# Patient Record
Sex: Female | Born: 1985 | Race: White | Hispanic: No | Marital: Married | Smoking: Former smoker
Health system: Southern US, Community
[De-identification: ages and names within clinical notes are randomized; demographics above are authoritative.]

## PROBLEM LIST (undated history)

## (undated) DIAGNOSIS — F329 Major depressive disorder, single episode, unspecified: Secondary | ICD-10-CM

## (undated) DIAGNOSIS — F32A Depression, unspecified: Secondary | ICD-10-CM

## (undated) DIAGNOSIS — R519 Headache, unspecified: Secondary | ICD-10-CM

## (undated) DIAGNOSIS — Z9889 Other specified postprocedural states: Secondary | ICD-10-CM

## (undated) DIAGNOSIS — I639 Cerebral infarction, unspecified: Secondary | ICD-10-CM

## (undated) DIAGNOSIS — R112 Nausea with vomiting, unspecified: Secondary | ICD-10-CM

## (undated) DIAGNOSIS — I058 Other rheumatic mitral valve diseases: Secondary | ICD-10-CM

## (undated) DIAGNOSIS — Q513 Bicornate uterus: Secondary | ICD-10-CM

## (undated) DIAGNOSIS — O3400 Maternal care for unspecified congenital malformation of uterus, unspecified trimester: Secondary | ICD-10-CM

## (undated) DIAGNOSIS — F419 Anxiety disorder, unspecified: Secondary | ICD-10-CM

## (undated) DIAGNOSIS — D649 Anemia, unspecified: Secondary | ICD-10-CM

## (undated) DIAGNOSIS — M359 Systemic involvement of connective tissue, unspecified: Secondary | ICD-10-CM

## (undated) DIAGNOSIS — I059 Rheumatic mitral valve disease, unspecified: Secondary | ICD-10-CM

## (undated) DIAGNOSIS — D6851 Activated protein C resistance: Secondary | ICD-10-CM

## (undated) HISTORY — PX: OTHER SURGICAL HISTORY: SHX169

## (undated) HISTORY — DX: Rheumatic mitral valve disease, unspecified: I05.9

## (undated) HISTORY — DX: Other rheumatic mitral valve diseases: I05.8

## (undated) HISTORY — DX: Activated protein C resistance: D68.51

---
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
## 2006-09-12 ENCOUNTER — Emergency Department: Payer: Self-pay | Admitting: Emergency Medicine

## 2008-06-16 ENCOUNTER — Emergency Department (HOSPITAL_COMMUNITY): Admission: EM | Admit: 2008-06-16 | Discharge: 2008-06-16 | Payer: Self-pay | Admitting: Emergency Medicine

## 2011-05-02 ENCOUNTER — Emergency Department: Payer: Self-pay | Admitting: Emergency Medicine

## 2011-05-13 NOTE — Op Note (Signed)
NAME:  Cristina [name], Cristina [name]                       ACCOUNT NO.:  [id number]   MEDICAL RECORD NO.:  [id number]                   PATIENT TYPE:  AMB   LOCATION:  DSC                                  FACILITY:  [facility]   PHYSICIAN:  Kathy Breach, M.D.                   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   OPERATION PERFORMED:  Bipedicle fascia graft tympanoplasty with an autograft  incus fasciculoplasty, right ear.   SURGEON:  Kathy Breach, M.D.   ANESTHESIA:  General orotracheal anesthesia.   DESCRIPTION OF PROCEDURE:  With the patient under general orotracheal  anesthesia, the right ear was prepped and draped in sterile fashion.  The  ear was inspected.  The patient had a dry large perforation approximately  80% of the tympanic membrane. The long process of the malleus was retracted  in a foreshortened position.  Around the margin of the posterior-superior  aspect, the atrophic tympanic membrane could be seen adherent onto the  stapes capitulum with apparent loss of the lenticular process of the incus.  Margins of perforation were completely stripped after having infiltrated the  canal skin with 1% Xylocaine 1:100,000 epinephrine as well as skin just  above the hairline posterior superior to the auricle for fascia graft  harvesting. Curved incision posterior superior to the auricle was made above  the hairline through skin and subcutaneous tissues down to the temporalis  fascia.  A large temporalis fascia graft was harvested and pressed between  tongue blades.  The donor site was closed with interrupted 4-0 chromic  catgut sutures interrupted and a  running 4-0 chromic subcuticular skin  closure.  Attention was then turned to the ear.  Tympanotomy flap was cut  from 6 o'clock inferior and 12 o'clock superiorly.  Just a remaining thin  section of canal skin was elevated and middle ear space entered.  The  remaining tympanic membrane retracted adherent on the capitulum posterior  superiorly was removed as it did not elevate with the flap.  All tympanic  membrane was cleaned in the area.  There was a fibrous adhesion from the  stapes capitulum to the eroded incus long process with complete ossicular  discontinuity.  The chorda tympani nerve was sacrificed to spread out over  the area as the incus was delivered for incus rotation. The mucosa was  cleaned off the stapes capitulum head.  There was 3 mm from the annulus to  the stapes capitular head but the norma position of the tympanic membrane  was somewhat retracted preoperatively.  Tensor tympani was severed to  lateralize the foreshortened and medialized  position of the long process of  the malleus.  An incus was then fashioned to do an incus rotation onlay with  the stapes capitulum placing the 1 mm pothole on the concave surface of the  body of the incus and allowing the convex surface to come towards the  tympanic membrane.  Gelfoam pledgets were used to pack the anterior two  thirds of the middle ear space.  The fascia graft was trimmed as smooth  edges and inserted, positioned medial to the long process of the malleus and  underlying the margins of the anterior and inferior aspects of the  perforation.  Graft was turned forward exposing the posterior superior  quadrant.  The incus autograft was placed on the head of the staples and  further packing the middle ear space to position the fascia graft to the  undersurface of the remnant, the tympanic membrane and stabilize the  position of the rotation of the incus.  The lateral surface of the rotated  incus was left exposed to the  fascia graft which was then turned back in  position spanning the entire position and elevated up the posterior canal  wall.  Tympanotomy flap was turned back in position overlying the posterior  aspect of the fascia  graft. This was secured in position packing the external canal with Gelfoam  soaked with Ciprodex suspension.  Sterile cotton was placed in the external  meatus.  Cortisporin ointment was applied to the donor site incision line.  The patient tolerated the procedure well and was taken to the recovery room  in stable general condition.                                               Kathy Breach, M.D.    Venia Minks  D:  03/12/2004  T:  03/15/2004  Job:  [id number]

## 2011-09-22 LAB — GC/CHLAMYDIA PROBE AMP, GENITAL
Chlamydia, DNA Probe: NEGATIVE
GC Probe Amp, Genital: NEGATIVE

## 2011-09-22 LAB — CBC
Hemoglobin: 12.4
MCHC: 34
RBC: 3.7 — ABNORMAL LOW
WBC: 9.3

## 2011-09-22 LAB — POCT PREGNANCY, URINE: Operator id: 294501

## 2011-09-22 LAB — DIFFERENTIAL
Lymphocytes Relative: 14
Lymphs Abs: 1.3
Monocytes Absolute: 0.4
Monocytes Relative: 5
Neutro Abs: 7.4

## 2011-09-22 LAB — WET PREP, GENITAL
Trich, Wet Prep: NONE SEEN
Yeast Wet Prep HPF POC: NONE SEEN

## 2011-09-22 LAB — OCCULT BLOOD X 1 CARD TO LAB, STOOL: Fecal Occult Bld: POSITIVE

## 2011-09-22 LAB — HCG, QUANTITATIVE, PREGNANCY: hCG, Beta Chain, Quant, S: 9374 — ABNORMAL HIGH

## 2011-11-28 ENCOUNTER — Emergency Department: Payer: Self-pay | Admitting: Internal Medicine

## 2012-08-01 ENCOUNTER — Inpatient Hospital Stay: Payer: Self-pay | Admitting: Obstetrics and Gynecology

## 2012-08-01 DIAGNOSIS — O365990 Maternal care for other known or suspected poor fetal growth, unspecified trimester, not applicable or unspecified: Secondary | ICD-10-CM

## 2012-08-01 LAB — PROTEIN / CREATININE RATIO, URINE
Protein, Random Urine: 8 mg/dL (ref 0–12)
Protein/Creat. Ratio: 258 mg/gCREAT — ABNORMAL HIGH (ref 0–200)

## 2012-08-01 LAB — CBC WITH DIFFERENTIAL/PLATELET
Eosinophil %: 0.5 %
Lymphocyte #: 2.5 10*3/uL (ref 1.0–3.6)
Lymphocyte %: 22.9 %
MCHC: 35.8 g/dL (ref 32.0–36.0)
MCV: 98 fL (ref 80–100)
Monocyte %: 4.6 %
Platelet: 166 10*3/uL (ref 150–440)

## 2012-08-01 LAB — BASIC METABOLIC PANEL
Anion Gap: 9 (ref 7–16)
BUN: 7 mg/dL (ref 7–18)
Chloride: 104 mmol/L (ref 98–107)
Co2: 22 mmol/L (ref 21–32)
Creatinine: 0.65 mg/dL (ref 0.60–1.30)
EGFR (Non-African Amer.): >60
Glucose: 79 mg/dL (ref 65–99)
Osmolality: 267 (ref 275–301)
Sodium: 135 mmol/L — ABNORMAL LOW (ref 136–145)

## 2012-08-01 LAB — URIC ACID: Uric Acid: 4 mg/dL (ref 2.6–6.0)

## 2012-08-02 LAB — HEMATOCRIT: HCT: 30.3 % — ABNORMAL LOW (ref 35.0–47.0)

## 2012-08-03 LAB — PATHOLOGY REPORT

## 2014-06-25 DIAGNOSIS — I639 Cerebral infarction, unspecified: Secondary | ICD-10-CM

## 2014-06-25 HISTORY — DX: Cerebral infarction, unspecified: I63.9

## 2014-07-16 ENCOUNTER — Encounter (HOSPITAL_COMMUNITY): Payer: Self-pay | Admitting: Radiology

## 2014-07-16 ENCOUNTER — Inpatient Hospital Stay (HOSPITAL_COMMUNITY)
Admission: EM | Admit: 2014-07-16 | Discharge: 2014-07-19 | DRG: 065 | Disposition: A | Discharge status: Home / Self Care | Payer: Medicaid Other | Attending: Internal Medicine | Admitting: Internal Medicine

## 2014-07-16 ENCOUNTER — Emergency Department (HOSPITAL_COMMUNITY): Payer: Medicaid Other

## 2014-07-16 DIAGNOSIS — I058 Other rheumatic mitral valve diseases: Secondary | ICD-10-CM

## 2014-07-16 DIAGNOSIS — D649 Anemia, unspecified: Secondary | ICD-10-CM

## 2014-07-16 DIAGNOSIS — D6489 Other specified anemias: Secondary | ICD-10-CM | POA: Diagnosis present

## 2014-07-16 DIAGNOSIS — M3211 Endocarditis in systemic lupus erythematosus: Secondary | ICD-10-CM | POA: Insufficient documentation

## 2014-07-16 DIAGNOSIS — F17200 Nicotine dependence, unspecified, uncomplicated: Secondary | ICD-10-CM | POA: Diagnosis present

## 2014-07-16 DIAGNOSIS — G8190 Hemiplegia, unspecified affecting unspecified side: Secondary | ICD-10-CM | POA: Diagnosis present

## 2014-07-16 DIAGNOSIS — E876 Hypokalemia: Secondary | ICD-10-CM | POA: Diagnosis present

## 2014-07-16 DIAGNOSIS — R29810 Facial weakness: Secondary | ICD-10-CM | POA: Diagnosis present

## 2014-07-16 DIAGNOSIS — Z823 Family history of stroke: Secondary | ICD-10-CM | POA: Diagnosis not present

## 2014-07-16 DIAGNOSIS — N19 Unspecified kidney failure: Secondary | ICD-10-CM | POA: Diagnosis present

## 2014-07-16 DIAGNOSIS — F411 Generalized anxiety disorder: Secondary | ICD-10-CM | POA: Diagnosis present

## 2014-07-16 DIAGNOSIS — Z79899 Other long term (current) drug therapy: Secondary | ICD-10-CM

## 2014-07-16 DIAGNOSIS — I6340 Cerebral infarction due to embolism of unspecified cerebral artery: Principal | ICD-10-CM | POA: Diagnosis present

## 2014-07-16 DIAGNOSIS — R29898 Other symptoms and signs involving the musculoskeletal system: Secondary | ICD-10-CM | POA: Diagnosis not present

## 2014-07-16 DIAGNOSIS — I059 Rheumatic mitral valve disease, unspecified: Secondary | ICD-10-CM

## 2014-07-16 DIAGNOSIS — I639 Cerebral infarction, unspecified: Secondary | ICD-10-CM | POA: Diagnosis present

## 2014-07-16 DIAGNOSIS — I498 Other specified cardiac arrhythmias: Secondary | ICD-10-CM | POA: Diagnosis present

## 2014-07-16 DIAGNOSIS — R471 Dysarthria and anarthria: Secondary | ICD-10-CM | POA: Diagnosis present

## 2014-07-16 DIAGNOSIS — G43909 Migraine, unspecified, not intractable, without status migrainosus: Secondary | ICD-10-CM | POA: Diagnosis present

## 2014-07-16 DIAGNOSIS — I6359 Cerebral infarction due to unspecified occlusion or stenosis of other cerebral artery: Secondary | ICD-10-CM

## 2014-07-16 DIAGNOSIS — I63411 Cerebral infarction due to embolism of right middle cerebral artery: Secondary | ICD-10-CM

## 2014-07-16 HISTORY — DX: Cerebral infarction, unspecified: I63.9

## 2014-07-16 LAB — URINALYSIS, ROUTINE W REFLEX MICROSCOPIC
Bilirubin Urine: NEGATIVE
Glucose, UA: NEGATIVE mg/dL
Hgb urine dipstick: NEGATIVE
Ketones, ur: NEGATIVE mg/dL
LEUKOCYTES UA: NEGATIVE
Nitrite: NEGATIVE
PROTEIN: NEGATIVE mg/dL
Specific Gravity, Urine: 1.008 (ref 1.005–1.030)
UROBILINOGEN UA: 0.2 mg/dL (ref 0.0–1.0)
pH: 6 (ref 5.0–8.0)

## 2014-07-16 LAB — COMPREHENSIVE METABOLIC PANEL
ALBUMIN: 3.8 g/dL (ref 3.5–5.2)
ALK PHOS: 46 U/L (ref 39–117)
ALT: 11 U/L (ref 0–35)
AST: 20 U/L (ref 0–37)
Anion gap: 15 (ref 5–15)
BILIRUBIN TOTAL: 0.5 mg/dL (ref 0.3–1.2)
BUN: 9 mg/dL (ref 6–23)
CO2: 22 mEq/L (ref 19–32)
Calcium: 9.2 mg/dL (ref 8.4–10.5)
Chloride: 101 mEq/L (ref 96–112)
Creatinine, Ser: 1.13 mg/dL — ABNORMAL HIGH (ref 0.50–1.10)
GFR calc Af Amer: 76 mL/min — ABNORMAL LOW (ref >90)
GFR calc non Af Amer: 66 mL/min — ABNORMAL LOW (ref >90)
Glucose, Bld: 145 mg/dL — ABNORMAL HIGH (ref 70–99)
POTASSIUM: 3.3 meq/L — AB (ref 3.7–5.3)
Sodium: 138 mEq/L (ref 137–147)
Total Protein: 7.7 g/dL (ref 6.0–8.3)

## 2014-07-16 LAB — I-STAT CHEM 8, ED
BUN: 7 mg/dL (ref 6–23)
Calcium, Ion: 1.14 mmol/L (ref 1.12–1.23)
Chloride: 102 mEq/L (ref 96–112)
Creatinine, Ser: 1.3 mg/dL — ABNORMAL HIGH (ref 0.50–1.10)
GLUCOSE: 147 mg/dL — AB (ref 70–99)
HEMATOCRIT: 40 % (ref 36.0–46.0)
HEMOGLOBIN: 13.6 g/dL (ref 12.0–15.0)
POTASSIUM: 3 meq/L — AB (ref 3.7–5.3)
SODIUM: 138 meq/L (ref 137–147)
TCO2: 21 mmol/L (ref 0–100)

## 2014-07-16 LAB — CBC WITH DIFFERENTIAL/PLATELET
BASOS ABS: 0 10*3/uL (ref 0.0–0.1)
BASOS PCT: 0 % (ref 0–1)
EOS PCT: 2 % (ref 0–5)
Eosinophils Absolute: 0.1 10*3/uL (ref 0.0–0.7)
HCT: 37.3 % (ref 36.0–46.0)
Hemoglobin: 13 g/dL (ref 12.0–15.0)
LYMPHS PCT: 50 % — AB (ref 12–46)
Lymphs Abs: 3.3 10*3/uL (ref 0.7–4.0)
MCH: 32.6 pg (ref 26.0–34.0)
MCHC: 34.9 g/dL (ref 30.0–36.0)
MCV: 93.5 fL (ref 78.0–100.0)
Monocytes Absolute: 0.4 10*3/uL (ref 0.1–1.0)
Monocytes Relative: 7 % (ref 3–12)
NEUTROS ABS: 2.7 10*3/uL (ref 1.7–7.7)
Neutrophils Relative %: 41 % — ABNORMAL LOW (ref 43–77)
PLATELETS: 176 10*3/uL (ref 150–400)
RBC: 3.99 MIL/uL (ref 3.87–5.11)
RDW: 12.2 % (ref 11.5–15.5)
WBC: 6.6 10*3/uL (ref 4.0–10.5)

## 2014-07-16 LAB — RAPID URINE DRUG SCREEN, HOSP PERFORMED
AMPHETAMINES: NOT DETECTED
BARBITURATES: NOT DETECTED
Benzodiazepines: NOT DETECTED
Cocaine: NOT DETECTED
Opiates: NOT DETECTED
TETRAHYDROCANNABINOL: NOT DETECTED

## 2014-07-16 LAB — PROTIME-INR
INR: 0.94 (ref 0.00–1.49)
PROTHROMBIN TIME: 12.6 s (ref 11.6–15.2)

## 2014-07-16 LAB — APTT: aPTT: 30 seconds (ref 24–37)

## 2014-07-16 LAB — ETHANOL

## 2014-07-16 LAB — CK TOTAL AND CKMB (NOT AT ARMC)
CK TOTAL: 103 U/L (ref 7–177)
CK, MB: 1.4 ng/mL (ref 0.3–4.0)
Relative Index: 1.4 (ref 0.0–2.5)

## 2014-07-16 LAB — I-STAT TROPONIN, ED: TROPONIN I, POC: 0.02 ng/mL (ref 0.00–0.08)

## 2014-07-16 LAB — TROPONIN I

## 2014-07-16 LAB — CBG MONITORING, ED: Glucose-Capillary: 94 mg/dL (ref 70–99)

## 2014-07-16 LAB — POC URINE PREG, ED: Preg Test, Ur: NEGATIVE

## 2014-07-16 MED ORDER — SODIUM CHLORIDE 0.9 % IV BOLUS (SEPSIS): 1000.0000 mL | Freq: Once | INTRAVENOUS | Status: DC

## 2014-07-16 MED ORDER — ALTEPLASE (STROKE) FULL DOSE INFUSION
0.9000 mg/kg | Freq: Once | INTRAVENOUS | Status: DC
  Filled 2014-07-16: qty 45

## 2014-07-16 MED ORDER — SODIUM CHLORIDE 0.9 % IV BOLUS (SEPSIS)
500.0000 mL | Freq: Once | INTRAVENOUS | Status: AC
  Administered 2014-07-16: 500 mL via INTRAVENOUS

## 2014-07-16 MED ORDER — IOHEXOL 300 MG/ML  SOLN
50.0000 mL | Freq: Once | INTRAMUSCULAR | Status: AC | PRN
  Administered 2014-07-16: 50 mL via INTRAVENOUS

## 2014-07-16 MED ORDER — ACETAMINOPHEN 325 MG PO TABS
650.0000 mg | ORAL_TABLET | Freq: Once | ORAL | Status: AC
  Administered 2014-07-17: 650 mg via ORAL
  Filled 2014-07-16: qty 2

## 2014-07-16 MED ORDER — POTASSIUM CHLORIDE CRYS ER 20 MEQ PO TBCR: 40.0000 meq | EXTENDED_RELEASE_TABLET | Freq: Once | ORAL | Status: DC

## 2014-07-16 MED ORDER — ASPIRIN EC 325 MG PO TBEC
325.0000 mg | DELAYED_RELEASE_TABLET | Freq: Once | ORAL | Status: AC
  Administered 2014-07-17: 325 mg via ORAL
  Filled 2014-07-16: qty 1

## 2014-07-16 MED ORDER — POTASSIUM CHLORIDE 10 MEQ/100ML IV SOLN
10.0000 meq | Freq: Once | INTRAVENOUS | Status: AC
  Administered 2014-07-16: 10 meq via INTRAVENOUS
  Filled 2014-07-16: qty 100

## 2014-07-16 NOTE — ED Notes (Signed)
Patient states she has a headache at this time.

## 2014-07-16 NOTE — ED Notes (Signed)
To CT with RN.

## 2014-07-16 NOTE — Consult Note (Addendum)
Neurology Consultation Reason for Consult: L arm weakness Referring Physician: Rhunette Croft, A  CC:  L Arm weakness  History is obtained from:Patient  HPI: Cristina [name] is a 28 y.o. female wit a history of migraines and anxiety who presents with left arm weakness that started earlier to night. She states taht she noticed tha she tried to tell her son something and was unable to say it. Her friend then looked at her mouth and noticed it was drooping and insisted that she come to the ER. She had some improvement after arrival .  She denies IV drug use. She denies any drug use other than occasional marijuana. She does take birth control and smoke. She has a history of migraines and had one last night, but no headache currently. She states that it was her typical migraine. She states taht she does get headacehs several tiems per month, but that they are much better than when she was younger.   She had some shortness of breath en route, but no further symptoms at this time.   LKW: 7:40 pm tpa given?: no, mild improving symptoms.   ROS: A 14 point ROS was performed and is negative except as noted in the HPI.   PMH: Anxiety migraine  Family History: Grandfather - stroke  Social History: Tob: current smoker  Exam: Current vital signs: BP 128/81  Pulse 117  Resp 22  Ht 5\' 2"  (1.575 m)  Wt 49.896 kg (110 lb)  BMI 20.11 kg/m2  SpO2 100% Vital signs in last 24 hours: Pulse Rate:  [117] 117 (07/22 2048) Resp:  [22] 22 (07/22 2048) BP: (128)/(81) 128/81 mmHg (07/22 2048) SpO2:  [100 %] 100 % (07/22 2048) Weight:  [49.896 kg (110 lb)] 49.896 kg (110 lb) (07/22 2048)  General: in bed, NAD CV: tachycardic Mental Status: Patient is awake, alert, oriented to person, place, month, year, and situation. Immediate and remote memory are intact. Patient is able to give a clear and coherent history. No signs of aphasia or neglect Cranial Nerves: II: Visual Fields are full. Pupils are  equal, round, and reactive to light.  Discs are difficult to visualize. III,IV, VI: EOMI without ptosis or diploplia.  V: Facial sensation is symmetric to temperature VII: Facial movement is symmetric.  VIII: hearing is intact to voice X: Uvula elevates symmetrically XI: Shoulder shrug is symmetric. XII: tongue is midline without atrophy or fasciculations.  Motor: Tone is normal. Bulk is normal. 5/5 strength was present in right arm, leg, and left leg. Initially, she was barely able to lift her left arm against gravity, but subsequently had just a mild drift, though still with some difficulty with fine motor movements.  Sensory: Sensation is symmetric to light touch and pin in the arms and legs. Deep Tendon Reflexes: 2+ and symmetric in the biceps and patellae.  Plantars: Toes are downgoing bilaterally.  Cerebellar: FNF and HKS are intact on the right and left leg, impaired FNF in the left arm consistent with weakness.  Gait: Not assessed due to acute nature of evaluation and multiple medical monitors in ED setting.   I have reviewed labs in epic and the results pertinent to this consultation are: Cr 1.13   I have reviewed the images obtained: MRI brain - multifocal infarcts.   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 Her symptoms rapidly improved in the ER to an NIHSS of 1. Given the mild improving symptoms, on label tPA was not pursued.  I discussed the PRISMS trial with the patient, but after discussing with her family, she opted to not pursue this trial.   Clearly in a patient this young further workup is needed. I would begin with CAT head and neck and further MR imaging including MRV, MRI w/wo contrast. If no clear cause of the stroke is found, then hypercoagulabale and consideration of autoimmune workup may be needed.   Recommendations: 1. HgbA1c, fasting lipid panel 2. MRI  of the brain with and without contrast 3. Frequent neuro  checks 4. Echocardiogram 5. Carotid dopplers are not needed given CTA 6. Prophylactic therapy-Antiplatelet med: Aspirin - dose 325mg  PO or 300mg  PR 7. Risk factor modification 8. Telemetry monitoring 9. PT consult, OT consult, Speech consult  Ritta SlotMcNeill Nefertiti Mohamad, MD [facility] [phone]  If 7pm- 7am, please page neurology on call as listed in [facility].

## 2014-07-16 NOTE — ED Notes (Signed)
Pt. reports sudden onset left arm weakness onset 7:45 pm this evening , speech clear/ no facial asymmetry / alert and oriented. Code stroke initiated. Charge nurse/EDP notified.

## 2014-07-16 NOTE — ED Notes (Signed)
Patient into MRI at this time.

## 2014-07-17 ENCOUNTER — Encounter (HOSPITAL_COMMUNITY): Payer: Self-pay | Admitting: Internal Medicine

## 2014-07-17 ENCOUNTER — Inpatient Hospital Stay (HOSPITAL_COMMUNITY): Payer: Medicaid Other

## 2014-07-17 DIAGNOSIS — I6350 Cerebral infarction due to unspecified occlusion or stenosis of unspecified cerebral artery: Secondary | ICD-10-CM

## 2014-07-17 DIAGNOSIS — I6789 Other cerebrovascular disease: Secondary | ICD-10-CM

## 2014-07-17 DIAGNOSIS — E876 Hypokalemia: Secondary | ICD-10-CM | POA: Diagnosis present

## 2014-07-17 DIAGNOSIS — G43909 Migraine, unspecified, not intractable, without status migrainosus: Secondary | ICD-10-CM | POA: Diagnosis not present

## 2014-07-17 DIAGNOSIS — D649 Anemia, unspecified: Secondary | ICD-10-CM | POA: Diagnosis present

## 2014-07-17 LAB — CBC WITH DIFFERENTIAL/PLATELET
BASOS ABS: 0 10*3/uL (ref 0.0–0.1)
BASOS PCT: 0 % (ref 0–1)
Eosinophils Absolute: 0.1 10*3/uL (ref 0.0–0.7)
Eosinophils Relative: 1 % (ref 0–5)
HCT: 33.7 % — ABNORMAL LOW (ref 36.0–46.0)
Hemoglobin: 11.6 g/dL — ABNORMAL LOW (ref 12.0–15.0)
LYMPHS PCT: 34 % (ref 12–46)
Lymphs Abs: 2.1 10*3/uL (ref 0.7–4.0)
MCH: 32.4 pg (ref 26.0–34.0)
MCHC: 34.4 g/dL (ref 30.0–36.0)
MCV: 94.1 fL (ref 78.0–100.0)
Monocytes Absolute: 0.4 10*3/uL (ref 0.1–1.0)
Monocytes Relative: 7 % (ref 3–12)
NEUTROS ABS: 3.5 10*3/uL (ref 1.7–7.7)
NEUTROS PCT: 58 % (ref 43–77)
Platelets: 153 10*3/uL (ref 150–400)
RBC: 3.58 MIL/uL — ABNORMAL LOW (ref 3.87–5.11)
RDW: 12.3 % (ref 11.5–15.5)
WBC: 6.1 10*3/uL (ref 4.0–10.5)

## 2014-07-17 LAB — COMPREHENSIVE METABOLIC PANEL
ALK PHOS: 37 U/L — AB (ref 39–117)
ALT: 9 U/L (ref 0–35)
AST: 16 U/L (ref 0–37)
Albumin: 3.2 g/dL — ABNORMAL LOW (ref 3.5–5.2)
Anion gap: 12 (ref 5–15)
BILIRUBIN TOTAL: 0.4 mg/dL (ref 0.3–1.2)
BUN: 8 mg/dL (ref 6–23)
CO2: 22 mEq/L (ref 19–32)
CREATININE: 0.89 mg/dL (ref 0.50–1.10)
Calcium: 8.5 mg/dL (ref 8.4–10.5)
Chloride: 108 mEq/L (ref 96–112)
GFR calc non Af Amer: 88 mL/min — ABNORMAL LOW (ref >90)
GLUCOSE: 117 mg/dL — AB (ref 70–99)
POTASSIUM: 4.1 meq/L (ref 3.7–5.3)
Sodium: 142 mEq/L (ref 137–147)
Total Protein: 6.3 g/dL (ref 6.0–8.3)

## 2014-07-17 LAB — LIPID PANEL
CHOLESTEROL: 104 mg/dL (ref 0–200)
HDL: 53 mg/dL (ref >39)
LDL Cholesterol: 40 mg/dL (ref 0–99)
Total CHOL/HDL Ratio: 2 RATIO
Triglycerides: 54 mg/dL (ref <150)
VLDL: 11 mg/dL (ref 0–40)

## 2014-07-17 LAB — HEMOGLOBIN A1C
HEMOGLOBIN A1C: 5.5 % (ref <5.7)
MEAN PLASMA GLUCOSE: 111 mg/dL (ref <117)

## 2014-07-17 LAB — ANTITHROMBIN III: ANTITHROMB III FUNC: 85 % (ref 75–120)

## 2014-07-17 LAB — SEDIMENTATION RATE: Sed Rate: 11 mm/hr (ref 0–22)

## 2014-07-17 LAB — TSH: TSH: 1.06 u[IU]/mL (ref 0.350–4.500)

## 2014-07-17 MED ORDER — SODIUM CHLORIDE 0.9 % IV SOLN
INTRAVENOUS | Status: AC
  Administered 2014-07-17 (×2): via INTRAVENOUS

## 2014-07-17 MED ORDER — SENNOSIDES-DOCUSATE SODIUM 8.6-50 MG PO TABS
1.0000 | ORAL_TABLET | Freq: Every evening | ORAL | Status: DC | PRN
  Filled 2014-07-17: qty 1

## 2014-07-17 MED ORDER — ASPIRIN 300 MG RE SUPP
300.0000 mg | Freq: Every day | RECTAL | Status: DC
  Filled 2014-07-17 (×2): qty 1

## 2014-07-17 MED ORDER — ASPIRIN 325 MG PO TABS
325.0000 mg | ORAL_TABLET | Freq: Every day | ORAL | Status: DC
Start: 2014-07-17 — End: 2014-07-19
  Administered 2014-07-17 – 2014-07-19 (×3): 325 mg via ORAL
  Filled 2014-07-17 (×3): qty 1

## 2014-07-17 MED ORDER — ENOXAPARIN SODIUM 40 MG/0.4ML ~~LOC~~ SOLN
40.0000 mg | SUBCUTANEOUS | Status: DC
  Administered 2014-07-17 – 2014-07-19 (×3): 40 mg via SUBCUTANEOUS
  Filled 2014-07-17 (×3): qty 0.4

## 2014-07-17 MED ORDER — STROKE: EARLY STAGES OF RECOVERY BOOK
Freq: Once | Status: AC
  Administered 2014-07-17: 07:00:00
  Filled 2014-07-17: qty 1

## 2014-07-17 MED ORDER — LORATADINE 10 MG PO TABS
10.0000 mg | ORAL_TABLET | Freq: Every day | ORAL | Status: DC
  Administered 2014-07-17 – 2014-07-19 (×3): 10 mg via ORAL
  Filled 2014-07-17 (×3): qty 1

## 2014-07-17 NOTE — Progress Notes (Signed)
*  PRELIMINARY RESULTS* Vascular Ultrasound Lower extremity venous duplex has been completed.  Preliminary findings: no evidence of DVT bilaterally. Left baker's cyst noted.  Farrel DemarkJill Eunice, RDMS, RVT  07/17/2014, 11:19 AM

## 2014-07-17 NOTE — Progress Notes (Signed)
PROGRESS NOTE    Cristina [name]:[id number] DOB: Nov 08, 1986 DOA: 07/16/2014 PCP: No PCP Per Patient  HPI/Brief narrative 28 year old white female with history of migraine, presented to the ED on 07/16/14 with acute onset of left-sided weakness & numbness-upper extremity >lower extremity, transient facial droop/slurred speech/swallowing difficulty, without headache which started at approximately 745 PM on 7/22. Patient was not administered TPA secondary to improvement in symptoms. CT head was negative for acute infarct. MRI head showed multiple infarcts in the right MCA territory. Neurology was consulted.    Assessment/Plan:  1. Right MCA territory acute embolic CVA: Etiology is unclear. Admitted to telemetry which shows sinus bradycardia in the 40s-50s which is probably physiologic for her age. Stroke workup ongoing. Stroke risk factors include cigarette smoking, OCP use and migraines. CTA head showed intermittent occlusion of right MCA insular branch. CTA neck normal. Checking MR head with contrast to look for other etiologies for stroke. Neurology consultation and followup appreciated-requesting hypercoagulable panel, vasculitic panel, lower extremity venous Dopplers, TCD with bubble and TEE to look for embolic source. Patient was on no antithrombotic prior to admission and now on aspirin 325 mg daily for secondary stroke prevention. Patient states no significant improvement in left extremity symptoms. PT and OT evaluation. 2. Hypokalemia: Replaced 3. Anemia: Possibly chronic. Follow CBCs 4. History of migraine: Asymptomatic. 5. History of tobacco abuse: Cessation counseled. 6. History of substance abuse/THC: Cessation counseled   Code Status: Full Family Communication: Discussed with boyfriend at bedside. Disposition Plan: Home when medically stable.   Consultants:  Neurology  Cardiology-for TEE  Procedures:  None  Antibiotics:  None   Subjective: Slurred speech,  facial droop and swallowing difficulties have resolved. Persisting left-sided weakness and numbness-no significant improvement since admission.  Objective: Filed Vitals:   07/17/14 0200 07/17/14 0350 07/17/14 0600 07/17/14 0800  BP: 107/64 107/67 133/70 117/77  Pulse: 53 56 49 51  Temp: 98 F (36.7 C) 98.2 F (36.8 C) 98.5 F (36.9 C)   TempSrc: Oral Oral Oral   Resp: 18 18 18 16   Height:      Weight:  47.991 kg (105 lb 12.8 oz)    SpO2: 98% 98% 100% 99%    Intake/Output Summary (Last 24 hours) at 07/17/14 1458 Last data filed at 07/17/14 1401  Gross per 24 hour  Intake    500 ml  Output   1300 ml  Net   -800 ml   Filed Weights   07/16/14 2048 07/17/14 0051 07/17/14 0350  Weight: 49.896 kg (110 lb) 47.991 kg (105 lb 12.8 oz) 47.991 kg (105 lb 12.8 oz)     Exam:  General exam: Pleasant young female lying comfortably in bed. Respiratory system: Clear. No increased work of breathing. Cardiovascular system: S1 & S2 heard, RRR. No JVD, murmurs, gallops, clicks or pedal edema. Telemetry: Sinus bradycardia in the 40s-50s. Gastrointestinal system: Abdomen is nondistended, soft and nontender. Normal bowel sounds heard. Central nervous system: Alert and oriented. No cranial nerve deficits. Extremities: 5 x 5 power in left limbs. Left upper extremity: Proximally grade 2 x 5 power and distally in hand grip and finger movement grade 0 x 5 power. Left hemisensory deficit.   Data Reviewed: Basic Metabolic Panel:  Recent Labs Lab 07/16/14 2024 07/16/14 2032 07/17/14 0355  NA 138 138 142  K 3.3* 3.0* 4.1  CL 101 102 108  CO2 22  --  22  GLUCOSE 145* 147* 117*  BUN 9 7 8   CREATININE 1.13* 1.30* 0.89  CALCIUM 9.2  --  8.5   Liver Function Tests:  Recent Labs Lab 07/16/14 2024 07/17/14 0355  AST 20 16  ALT 11 9  ALKPHOS 46 37*  BILITOT 0.5 0.4  PROT 7.7 6.3  ALBUMIN 3.8 3.2*   No results found for this basename: LIPASE, AMYLASE,  in the last 168 hours No results  found for this basename: AMMONIA,  in the last 168 hours CBC:  Recent Labs Lab 07/16/14 2024 07/16/14 2032 07/17/14 0355  WBC 6.6  --  6.1  NEUTROABS 2.7  --  3.5  HGB 13.0 13.6 11.6*  HCT 37.3 40.0 33.7*  MCV 93.5  --  94.1  PLT 176  --  153   Cardiac Enzymes:  Recent Labs Lab 07/16/14 2024  CKTOTAL 103  CKMB 1.4  TROPONINI <0.30   BNP (last 3 results) No results found for this basename: PROBNP,  in the last 8760 hours CBG:  Recent Labs Lab 07/16/14 2128  GLUCAP 94    No results found for this or any previous visit (from the past 240 hour(s)).    Additional labs: 1. Lipid panel: Cholesterol 104, triglycerides 54, HDL 53, LDL 14 VLDL 11 2. Hemoglobin A1c: 5.5 3. TSH: 1.060 4. Urine pregnancy test: Negative 5. UDS: Negative. Blood alcohol level <11 6. 2-D echo 07/17/14: Study Conclusions  - Left ventricle: The cavity size was normal. Systolic function was normal. The estimated ejection fraction was in the range of 55% to 60%. Wall motion was normal; there were no regional wall motion abnormalities. - Atrial septum: No defect or patent foramen ovale was identified.       Studies: Ct Angio Head W/cm &/or Wo Cm  07/16/2014   CLINICAL DATA:  Stroke.  EXAM: CT ANGIOGRAPHY HEAD AND NECK  TECHNIQUE: Multidetector CT imaging of the head and neck was performed using the standard protocol during bolus administration of intravenous contrast. Multiplanar CT image reconstructions and MIPs were obtained to evaluate the vascular anatomy. Carotid stenosis measurements (when applicable) are obtained utilizing NASCET criteria, using the distal internal carotid diameter as the denominator.  CONTRAST:  50mL OMNIPAQUE IOHEXOL 300 MG/ML  SOLN  COMPARISON:  MRI of the head July 16, 2014  FINDINGS: CTA HEAD FINDINGS  Anterior circulation: Normal appearance of the cervical internal carotid arteries, petrous, cavernous and supra clinoid internal carotid arteries. Widely patent anterior  communicating artery. Focal occlusion of right M2/3 insular branch, with reconstitution and subsequent high-grade stenosis versus occlusion. In addition, relative asymmetric decreased number of distal right anterior cerebral artery branches, axial 8/59.  Posterior circulation: Left vertebral artery is dominant, with normal appearance of the vertebral arteries, vertebrobasilar junction and basilar artery, as well as main branch vessels. Small bilateral posterior communicating arteries are present. Normal appearance of the posterior cerebral arteries.  No large vessel occlusion, dissection, contrast extravasation or aneurysm within the anterior nor posterior circulation.  Right parietal transcortical encephalomalacia. No intraparenchymal hemorrhage, mass effect or midline shift. Right choroidal fissure cyst.  Review of the MIP images confirms the above findings.  CTA NECK FINDINGS  Normal appearance of the thoracic arch, two-vessel arch is a normal variant. The origins of the innominate, left Common carotid artery and subclavian artery are widely patent.  Bilateral Common carotid arteries are widely patent, coursing in a straight line fashion. Normal appearance of the carotid bifurcations without hemodynamically significant stenosis by NASCET criteria. Normal appearance of the included internal carotid arteries.  Left vertebral artery is dominant. Normal appearance of the  vertebral arteries, which appear widely patent.  No hemodynamically significant stenosis by NASCET criteria. No dissection, no pseudoaneurysm. No abnormal luminal irregularity. No contrast extravasation.  Soft tissues are unremarkable. No acute osseous process though bone windows have not been submitted.  Review of the MIP images confirms the above findings.  IMPRESSION: CTA head: Intermittent occlusion of right middle cerebral artery insular branch, which could reflect thromboembolic disease or possible vasculopathy. In addition, relative  asymmetric, decreased distal right anterior cerebral artery vessels, equivocal for clinically significant finding, could reflect normal variant. Findings may be better characterized on MRI of the head with and without contrast including gradient sequences.  Right parietal transcortical encephalomalacia may reflect remote infarct or possibly head trauma.  CTA neck:  Normal CT angiogram of the neck.  Preliminary findings discussed with and reconfirmed by Dr.MCNEILL [facility] on7/22/2015at2230 hr.   Electronically Signed   By: Awilda Metro   On: 07/16/2014 23:45   Dg Chest 2 View  07/17/2014   CLINICAL DATA:  Unable to lift arm secondary to stroke.  EXAM: CHEST  2 VIEW  COMPARISON:  None  FINDINGS: The heart size and mediastinal contours are within normal limits. Both lungs are clear. The visualized skeletal structures are unremarkable.  IMPRESSION: No active cardiopulmonary disease.   Electronically Signed   By: Elige Ko   On: 07/17/2014 13:31   Ct Head Wo Contrast  07/16/2014   CLINICAL DATA:  Left-sided weakness. LEFT arm weakness. Code stroke.  EXAM: CT HEAD WITHOUT CONTRAST  TECHNIQUE: Contiguous axial images were obtained from the base of the skull through the vertex without intravenous contrast.  COMPARISON:  None.  FINDINGS: No mass lesion, mass effect, midline shift, hydrocephalus, hemorrhage. No territorial ischemia or acute infarction. RIGHT choroidal fissure cyst incidentally noted. Paranasal sinuses appear within normal limits. Calvarium intact.  IMPRESSION: Negative CT head. Critical Value/emergent results were called by telephone at the time of interpretation on 07/16/2014 at 8:39 pm to Dr. Amada Jupiter , who verbally acknowledged these results.   Electronically Signed   By: Andreas Newport M.D.   On: 07/16/2014 20:40   Ct Angio Neck W/cm &/or Wo/cm  07/16/2014   CLINICAL DATA:  Stroke.  EXAM: CT ANGIOGRAPHY HEAD AND NECK  TECHNIQUE: Multidetector CT imaging of the head and neck was  performed using the standard protocol during bolus administration of intravenous contrast. Multiplanar CT image reconstructions and MIPs were obtained to evaluate the vascular anatomy. Carotid stenosis measurements (when applicable) are obtained utilizing NASCET criteria, using the distal internal carotid diameter as the denominator.  CONTRAST:  50mL OMNIPAQUE IOHEXOL 300 MG/ML  SOLN  COMPARISON:  MRI of the head July 16, 2014  FINDINGS: CTA HEAD FINDINGS  Anterior circulation: Normal appearance of the cervical internal carotid arteries, petrous, cavernous and supra clinoid internal carotid arteries. Widely patent anterior communicating artery. Focal occlusion of right M2/3 insular branch, with reconstitution and subsequent high-grade stenosis versus occlusion. In addition, relative asymmetric decreased number of distal right anterior cerebral artery branches, axial 8/59.  Posterior circulation: Left vertebral artery is dominant, with normal appearance of the vertebral arteries, vertebrobasilar junction and basilar artery, as well as main branch vessels. Small bilateral posterior communicating arteries are present. Normal appearance of the posterior cerebral arteries.  No large vessel occlusion, dissection, contrast extravasation or aneurysm within the anterior nor posterior circulation.  Right parietal transcortical encephalomalacia. No intraparenchymal hemorrhage, mass effect or midline shift. Right choroidal fissure cyst.  Review of the MIP images confirms the above findings.  CTA NECK FINDINGS  Normal appearance of the thoracic arch, two-vessel arch is a normal variant. The origins of the innominate, left Common carotid artery and subclavian artery are widely patent.  Bilateral Common carotid arteries are widely patent, coursing in a straight line fashion. Normal appearance of the carotid bifurcations without hemodynamically significant stenosis by NASCET criteria. Normal appearance of the included internal  carotid arteries.  Left vertebral artery is dominant. Normal appearance of the vertebral arteries, which appear widely patent.  No hemodynamically significant stenosis by NASCET criteria. No dissection, no pseudoaneurysm. No abnormal luminal irregularity. No contrast extravasation.  Soft tissues are unremarkable. No acute osseous process though bone windows have not been submitted.  Review of the MIP images confirms the above findings.  IMPRESSION: CTA head: Intermittent occlusion of right middle cerebral artery insular branch, which could reflect thromboembolic disease or possible vasculopathy. In addition, relative asymmetric, decreased distal right anterior cerebral artery vessels, equivocal for clinically significant finding, could reflect normal variant. Findings may be better characterized on MRI of the head with and without contrast including gradient sequences.  Right parietal transcortical encephalomalacia may reflect remote infarct or possibly head trauma.  CTA neck:  Normal CT angiogram of the neck.  Preliminary findings discussed with and reconfirmed by Dr.MCNEILL [facility] on7/22/2015at2230 hr.   Electronically Signed   By: Awilda Metro   On: 07/16/2014 23:45   Mr Brain Wo Contrast  07/16/2014   CLINICAL DATA:  Left-sided weakness, numbness and new onset confusion.  EXAM: MRI HEAD WITHOUT CONTRAST  TECHNIQUE: Multiplanar, multiecho pulse sequences of the brain and surrounding structures were obtained without intravenous contrast.  COMPARISON:  Head CT same day  FINDINGS: Diffusion imaging in the axial coronal planes shows multiple punctate foci of restricted diffusion within the right middle cerebral artery territory affecting the posterior temporal and parietal regions. The largest insult is only about 3 mm in size. No sign of swelling or hemorrhage. No lesion seen in left hemisphere or in the cerebellum or affecting the brainstem.  No evidence of hydrocephalus. No evidence of mass lesion or  extra-axial collection.  IMPRESSION: Numerous punctate acute infarctions in the right posterior temporal and parietal region consistent with micro embolic infarctions in the right middle cerebral artery territory.   Electronically Signed   By: Paulina Fusi M.D.   On: 07/16/2014 21:25   Mr Laqueta Jean ZO Contrast  07/17/2014   CLINICAL DATA:  28 year old female presented with left upper extremity weakness and left facial droop found to have small acute right hemisphere infarcts on MRI, subsequently right MCA M2 disease on CTA. Headache upon presentation. Progression of left side weakness arm greater than leg. Initial encounter.  EXAM: MRI HEAD WITHOUT AND WITH CONTRAST  TECHNIQUE: Multiplanar, multiecho pulse sequences of the brain and surrounding structures were obtained without and with intravenous contrast.  CONTRAST:  10 mL MultiHance.  COMPARISON:  Head and neck CTA 07/16/2014.  Brain MRI 07/16/2014.  FINDINGS: Progression of right MCA restricted diffusion, now with confluent involvement in the operculum, right parietal lobe, and scattered cortical diffusion restriction in the superior peri-Rolandic and pre motor areas.  There is new confluent restricted diffusion in the left anterior superior frontal gyrus, pre motor left ACA/ MCA watershed territory. There are also scattered small right occipital lobe and possibly also left posterior temporal lobe lacunar type areas of restricted diffusion (series 3, images 11 and 12).  No deep gray matter nuclei, or posterior fossa restricted diffusion. Major intracranial vascular flow voids are  preserved, although there is abnormal FLAIR signal in the dominant right MCA posterior sylvian division, corresponding to the CTA findings (series 7, images 10-14).  Associated mild T2 and FLAIR hyperintensity in the affected areas. No mass effect. No definite hemorrhagic transformation ; asymmetry on susceptibility weighted imaging is felt primarily due to vascular thrombus or  asymmetric slow vessel flow. No asymmetry of the major intracranial venous structures identified.  No abnormal enhancement identified.  Small right mesial temporal lobe hippocampal fissure all cyst or arachnoid cyst re- identified. No significant intracranial mass lesion. No ventriculomegaly. No midline shift. Patent basilar cisterns. Negative pituitary, cervicomedullary junction and visualized cervical spine. Visible internal auditory structures appear normal. Mastoids are clear. Minor paranasal sinus mucosal thickening. Visualized orbit soft tissues are within normal limits. Visualized scalp soft tissues are within normal limits.  IMPRESSION: 1. Interval progression of right MCA infarcts. Right operculum and parietal lobe confluent [name] affected now. Small cortical infarcts in the peri-Rolandic region. 2. New left superior frontal gyrus, left ACA - left MCA watershed type, infarct. Small right occipital lacunar infarcts, possibly also in the posterior left temporal lobe. 3. The constellation of recent CTA and MRI data suggests a proximal aortic or cardiac source of emboli. 4. No mass effect or hemorrhage.  No abnormal enhancement. 5. Major vascular flow voids appear stable to the recent CTA, including evidence of abnormal flow in the right MCA dominant posterior sylvian branch. Study discussed by telephone with Dr. Marvel PlanJINDONG XU on 07/17/2014 at 13:52 .   Electronically Signed   By: Augusto GambleLee  Hall M.D.   On: 07/17/2014 13:55        Scheduled Meds: . aspirin  300 mg Rectal Daily   Or  . aspirin  325 mg Oral Daily  . enoxaparin (LOVENOX) injection  40 mg Subcutaneous Q24H  . loratadine  10 mg Oral Daily   Continuous Infusions: . sodium chloride 100 mL/hr at 07/17/14 1043    Principal Problem:   CVA (cerebral infarction) Active Problems:   Stroke    Time spent: 40 minutes.    Marcellus ScottHONGALGI,Tais Koestner, MD, FACP, FHM. [facility] Pager [phone]  If 7PM-7AM, please contact  night-coverage [email or web address] Password [email or web address] 07/17/2014, 2:58 PM    LOS: 1 day

## 2014-07-17 NOTE — Progress Notes (Addendum)
    [facility] has been requested to perform a transesophageal echocardiogram on 07/18/14 at 1pm with Dr. Rennis GoldenHilty for CVA. After careful review of history and examination, the risks and benefits of transesophageal echocardiogram have been explained including risks of esophageal damage, perforation (1:10,000 risk), bleeding, pharyngeal hematoma as well as other potential complications associated with conscious sedation including aspiration, arrhythmia, respiratory failure and death. Alternatives to treatment were discussed, questions were answered. Patient is willing to proceed. NPO after midnight.  Thereasa ParkinSTERN, Cristina Jambor, PA-C 07/17/2014 2:14 PM

## 2014-07-17 NOTE — Care Management Note (Addendum)
    Page 1 of 1   07/18/2014     12:54:41 PM CARE MANAGEMENT NOTE 07/18/2014  Patient:  Cristina [name],Cristina [name]   Account Number:  [id number]  Date Initiated:  07/17/2014  Documentation initiated by:  GRAVES-BIGELOW,Krystl Wickware  Subjective/Objective Assessment:   Pt admitted for Left upper extremity weakness. MRI + for IMPRESSION:  Numerous punctate acute infarctions in the right posterior temporal.     Action/Plan:   CM will continue to monitor for disposition needs.   Anticipated DC Date:  07/18/2014   Anticipated DC Plan:  HOME W HOME HEALTH SERVICES      DC Planning Services  CM consult      Choice offered to / List presented to:             Status of service:  Completed, signed off Medicare Important Message given?  NO (If response is "NO", the following Medicare IM given date fields will be blank) Date Medicare IM given:   Medicare IM given by:   Date Additional Medicare IM given:   Additional Medicare IM given by:    Discharge Disposition:  HOME/SELF CARE  Per UR Regulation:  Reviewed for med. necessity/level of care/duration of stay  If discussed at Long Length of Stay Meetings, dates discussed:    Comments:  07-18-14 1249 Tomi BambergerBrenda Graves-Bigelow, RN, BSN [phone] CM did speak to pt and family and are agreeable to outpatient rehab at the [facility] [place]. Pt [place] transportation. CM did send an EPIC referral. Rehab to contact pt for visit times. CM provided pt with [facility]. Was unable to schedule appointment. MD aware. CM did discuss [facility]. MD to consult with neurology for cheaper statin.

## 2014-07-17 NOTE — Progress Notes (Signed)
Stroke Team Progress Note  HISTORY Cristina [name] is a 28 y.o. female wit a history of migraines and anxiety who presents with left arm weakness that started earlier to night, 07/16/2014 at 740 pm. She states taht she noticed tha she tried to tell her son something and was unable to say it. Her friend then looked at her mouth and noticed it was drooping and insisted that she come to the ER. She had some improvement after arrival. She denies IV drug use. She denies any drug use other than occasional marijuana. She does take birth control and smoke. She has a history of migraines and had one last night, but no headache currently. She states that it was her typical migraine. She states taht she does get headacehs several tiems per month, but that they are much better than when she was younger. She had some shortness of breath en route, but no further symptoms at this time. Patient was not administered TPA secondary to  mild improving symptoms.  She was admitted for further evaluation and treatment.  SUBJECTIVE Her husband is at the bedside.  Overall she feels her condition is gradually improving, but remains unable to say everything she attempts to. No history of miscarriage, diabetes. Has 1 child.  OBJECTIVE Most recent Vital Signs: Filed Vitals:   07/17/14 0200 07/17/14 0350 07/17/14 0600 07/17/14 0800  BP: 107/64 107/67 133/70 117/77  Pulse: 53 56 49 51  Temp: 98 F (36.7 C) 98.2 F (36.8 C) 98.5 F (36.9 C)   TempSrc: Oral Oral Oral   Resp: 18 18 18 16   Height:      Weight:  47.991 kg (105 lb 12.8 oz)    SpO2: 98% 98% 100% 99%   CBG (last 3)   Recent Labs  07/16/14 2128  GLUCAP 94    IV Fluid Intake:   . sodium chloride 100 mL/hr at 07/17/14 0649    MEDICATIONS  . aspirin  300 mg Rectal Daily   Or  . aspirin  325 mg Oral Daily  . enoxaparin (LOVENOX) injection  40 mg Subcutaneous Q24H  . loratadine  10 mg Oral Daily   PRN:  senna-docusate  Diet:  Cardiac thin  liquids Activity:  OOB with assistance DVT Prophylaxis:  Lovenox 40 mg sq daily   CLINICALLY SIGNIFICANT STUDIES Basic Metabolic Panel:   Recent Labs Lab 07/16/14 2024 07/16/14 2032 07/17/14 0355  NA 138 138 142  K 3.3* 3.0* 4.1  CL 101 102 108  CO2 22  --  22  GLUCOSE 145* 147* 117*  BUN 9 7 8   CREATININE 1.13* 1.30* 0.89  CALCIUM 9.2  --  8.5   Liver Function Tests:   Recent Labs Lab 07/16/14 2024 07/17/14 0355  AST 20 16  ALT 11 9  ALKPHOS 46 37*  BILITOT 0.5 0.4  PROT 7.7 6.3  ALBUMIN 3.8 3.2*   CBC:   Recent Labs Lab 07/16/14 2024 07/16/14 2032 07/17/14 0355  WBC 6.6  --  6.1  NEUTROABS 2.7  --  3.5  HGB 13.0 13.6 11.6*  HCT 37.3 40.0 33.7*  MCV 93.5  --  94.1  PLT 176  --  153   Coagulation:   Recent Labs Lab 07/16/14 2024  LABPROT 12.6  INR 0.94   Cardiac Enzymes:   Recent Labs Lab 07/16/14 2024  CKTOTAL 103  CKMB 1.4  TROPONINI <0.30   Urinalysis:   Recent Labs Lab 07/16/14 2153  COLORURINE YELLOW  LABSPEC 1.008  PHURINE  6.0  GLUCOSEU NEGATIVE  HGBUR NEGATIVE  BILIRUBINUR NEGATIVE  KETONESUR NEGATIVE  PROTEINUR NEGATIVE  UROBILINOGEN 0.2  NITRITE NEGATIVE  LEUKOCYTESUR NEGATIVE   Lipid Panel    Component Value Date/Time   CHOL 104 07/17/2014 0355   TRIG 54 07/17/2014 0355   HDL 53 07/17/2014 0355   CHOLHDL 2.0 07/17/2014 0355   VLDL 11 07/17/2014 0355   LDLCALC 40 07/17/2014 0355   HgbA1C  No results found for this basename: HGBA1C    Urine Drug Screen:     Component Value Date/Time   LABOPIA NONE DETECTED 07/16/2014 2153   COCAINSCRNUR NONE DETECTED 07/16/2014 2153   LABBENZ NONE DETECTED 07/16/2014 2153   AMPHETMU NONE DETECTED 07/16/2014 2153   THCU NONE DETECTED 07/16/2014 2153   LABBARB NONE DETECTED 07/16/2014 2153    Alcohol Level:   Recent Labs Lab 07/16/14 2024  ETH <11    CT Angio Head  07/16/2014    Intermittent occlusion of right middle cerebral artery insular branch, which could reflect  thromboembolic disease or possible vasculopathy. In addition, relative asymmetric, decreased distal right anterior cerebral artery vessels, equivocal for clinically significant finding, could reflect normal variant. Findings may be better characterized on MRI of the head with and without contrast including gradient sequences.  Right parietal transcortical encephalomalacia may reflect remote infarct or possibly head trauma.   CT Angio Neck  07/16/2014   Normal CT angiogram of the neck.    CT of the brain  07/16/2014    Negative CT head.   MRI of the brain without contrast   07/16/2014    Numerous punctate acute infarctions in the right posterior temporal and parietal region consistent with micro embolic infarctions in the right middle cerebral artery territory.     MRI brain with contrast   MRA of the brain  See CTA head  Carotid Doppler  See CTA neck  2D Echocardiogram    CXR    EKG  sinus tachycardia. For complete results please see formal report.   Therapy Recommendations   Physical Exam    Temp:  [97.9 F (36.6 C)-99.4 F (37.4 C)] 97.9 F (36.6 C) (07/23 2015) Pulse Rate:  [49-99] 61 (07/23 2015) Resp:  [15-24] 16 (07/23 2015) BP: (107-144)/(64-91) 109/72 mmHg (07/23 2015) SpO2:  [97 %-100 %] 100 % (07/23 2015) Weight:  [105 lb 12.8 oz (47.991 kg)] 105 lb 12.8 oz (47.991 kg) (07/23 0350)  General - Well nourished, well developed, in no apparent distress.  Ophthalmologic - Sharp disc margins OU.  Cardiovascular - Regular rate and rhythm with no murmur.  Mental Status -  Level of arousal and orientation to time, place, and person were intact. Language including expression, naming, repetition, comprehension, reading, and writing was assessed and found intact. Attention span and concentration were normal.  Cranial Nerves II - XII - II - Vision intact OU. III, IV, VI - Extraocular movements intact. V - Facial sensation intact bilaterally. VII - left lower facial  weakness. VIII - Hearing & vestibular intact bilaterally. X - Palate elevates symmetrically. XI - Chin turning & shoulder shrug intact bilaterally. XII - Tongue protrusion intact.  Motor Strength - The patient's strength was 3-/5 at left UE and 3/5 LLE and pronator drift was present on the left.  Bulk was normal and fasciculations were absent.   Motor Tone - Muscle tone was assessed at the neck and appendages and was normal.  Reflexes - The patient's reflexes were normal in all extremities and she had  no pathological reflexes.  Sensory - Light touch, temperature/pinprick, vibration and proprioception were assessed and were normal.    Coordination - The patient had normal movements in right hand and foot with no ataxia or dysmetria.  Tremor was absent.  Gait and Station - not tested.   ASSESSMENT Cristina [name] is a 28 y.o. female presenting with left arm aeakness. 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 Patient does have a history of cigarette smoking, OCP use and migraines. She does report a headache yesterday.  On no antithrobotics prior to admission. Now on aspirin 325 mg orally every day for secondary stroke prevention. Patient with resultant left hemiparesis (arm>leg), left hemisensory deficits, left lower facial droop. Stroke work up underway.   Hx migraine  LDL 40  OCP use Cigarette smoker, advised to stop smoking  Hospital day # 1  TREATMENT/PLAN  Continue aspirin 325 mg orally every day for secondary stroke prevention.  MRI repeat since pt had progression of her symptoms.   Check MR head with contrast to look for other etiologies of stroke  Cancel MRV (Dr. Roda ShuttersXu will discuss need for MRV with radiology, he doubts cortical venous thrombosis) TEE to look for embolic source. Arranged with [facility] for tomorrow.  If positive for PFO (patent foramen ovale), check bilateral lower extremity venous dopplers to  rule out DVT as possible source of stroke. (I have made patient NPO after midnight tonight). Check blood cultures x 2  F/u LE dopplers, TCD with bubble did not show PFO Full Hypercoagulable panel, vasculitic panel, include Anca and Sjogren's panels Therapy evals  Annie MainSHARON BIBY, MSN, RN, ANVP-BC, ANP-BC, GNP-BC [facility] Pager: [phone] 07/17/2014 10:42 AM  SIGNED I, the attending vascular neurologist, have personally obtained a history, examined the patient, evaluated laboratory data, individually viewed imaging studies, and formulated the assessment and plan of care.  I have made any additions or clarifications directly to the above note and agree with the findings and plan as currently documented.    Marvel PlanJindong Druscilla Petsch, MD PhD 07/17/2014 10:17 PM   To contact Stroke Continuity provider, please refer to [email or web address]. After hours, contact General Neurology

## 2014-07-17 NOTE — Progress Notes (Signed)
Echocardiogram 2D Echocardiogram has been performed.  Cristina [name] 07/17/2014, 10:14 AM

## 2014-07-17 NOTE — Procedures (Addendum)
[facility]  [address]  [place] [id number].  [phone]   TRANSCRANIAL DOPPLER BUBBLE STUDY  Cristina LymeJennifer L [name]  Date of Birth: 02/24/1986 Medical Record Number: [id number] Indications: stroke in young Date of Procedure: 07/17/2014 Clinical History: embolic stroke Technical Description: Transcranial Doppler Bubble Study was performed at the bedside after taking written informed consent from the patient and explaining risk/benefits. The right middle cerebral artery was insonated using a hand held probe. And IV line had been previously inserted in the right forearm by the RN using aseptic precautions. Agitated saline injection at rest and after valsalva maneuver did not result in high intensity transient signals (HITS).  Impression: negative Transcranial Doppler Bubble Study indicative of no right to left shunt   Results were explained to the patient. Questions were answered.  Cristina PlanJindong Gillie Crisci, MD PhD 07/17/2014 10:27 PM

## 2014-07-17 NOTE — Progress Notes (Signed)
Vascular Ultrasound Transcranial Doppler with Bubbles has been completed.   07/17/2014 3:38 PM Gertie FeyMichelle Casimira Sutphin, RVT, RDCS, RDMS

## 2014-07-17 NOTE — ED Provider Notes (Signed)
CSN: [id number]     Arrival date & time 07/16/14  2007 History   First MD Initiated Contact with Patient 07/16/14 2053     Chief Complaint  Patient presents with  . Code Stroke    An emergency department physician performed an initial assessment on this suspected stroke patient at 53. (Consider location/radiation/quality/duration/timing/severity/associated sxs/prior Treatment) Patient is a 28 y.o. female presenting with neurologic complaint.  Neurologic Problem This is a new problem. The current episode started today. The problem occurs constantly. The problem has been unchanged. Associated symptoms include numbness and weakness. Pertinent negatives include no abdominal pain, chest pain, congestion, coughing, fatigue, headaches, nausea, visual change or vomiting.    History reviewed. No pertinent past medical history. Past Surgical History  Procedure Laterality Date  . Right ear surgery     Family History  Problem Relation Age of Onset  . CAD Mother   . Stroke Other    History  Substance Use Topics  . Smoking status: Current Every Day Smoker  . Smokeless tobacco: Not on file  . Alcohol Use: Yes     Comment: Occasionally.   OB History   Grav Para Term Preterm Abortions TAB SAB Ect Mult Living                 Review of Systems  Constitutional: Negative for activity change and fatigue.  HENT: Negative for congestion.   Respiratory: Negative for cough and shortness of breath.   Cardiovascular: Negative for chest pain and leg swelling.  Gastrointestinal: Negative for nausea, vomiting, abdominal pain, diarrhea, constipation, blood in stool and abdominal distention.  Genitourinary: Negative for dysuria, flank pain and vaginal discharge.  Musculoskeletal: Negative for back pain.  Skin: Negative for color change.  Neurological: Positive for weakness and numbness. Negative for syncope, facial asymmetry, speech difficulty and headaches.  Psychiatric/Behavioral: Negative for  agitation.      Allergies  Review of patient's allergies indicates not on file.  Home Medications   Prior to Admission medications   Medication Sig Start Date End Date Taking? Authorizing Provider  loratadine (CLARITIN) 10 MG tablet Take 10 mg by mouth daily.   Yes Historical Provider, MD  Norgestimate-Ethinyl Estradiol Triphasic (ORTHO TRI-CYCLEN LO) 0.18/0.215/0.25 MG-25 MCG tab Take 1 tablet by mouth daily.   Yes Historical Provider, MD   BP 116/72  Pulse 63  Temp(Src) 98.7 F (37.1 C) (Oral)  Resp 18  Ht 5\' 2"  (1.575 m)  Wt 105 lb 12.8 oz (47.991 kg)  BMI 19.35 kg/m2  SpO2 98%  LMP 06/25/2014 Physical Exam  Constitutional: She is oriented to person, place, and time. She appears well-developed.  HENT:  Head: Normocephalic.  Eyes: Pupils are equal, round, and reactive to light.  Neck: Neck supple.  Cardiovascular: Normal rate.  Exam reveals no gallop and no friction rub.   No murmur heard. Pulmonary/Chest: Effort normal and breath sounds normal. No respiratory distress.  Abdominal: Soft. She exhibits no distension. There is no tenderness. There is no rebound.  Musculoskeletal: She exhibits no edema.  Neurological: She is alert and oriented to person, place, and time.  Skin: Skin is warm.  Psychiatric: She has a normal mood and affect.   Cranial nerves III-XII grossly intact Strength 5+/5+ to lower extremities bilaterally with resistance applied, equal distribution noted 4+/5+ left arm MCP, PIP, DIP joints.  Strength intact to right  MCP, PIP, DIP joints of  Hand, weakness noted in left arm and hand Negative arm drift Fine motor skills intact Heel  to knee down shin normal bilaterally Gait proper, proper balance - deferred  ED Course  Procedures (including critical care time) Labs Review Labs Reviewed  COMPREHENSIVE METABOLIC PANEL - Abnormal; Notable for the following:    Potassium 3.3 (*)    Glucose, Bld 145 (*)    Creatinine, Ser 1.13 (*)    GFR calc non Af  Amer 66 (*)    GFR calc Af Amer 76 (*)    All other components within normal limits  CBC WITH DIFFERENTIAL - Abnormal; Notable for the following:    Neutrophils Relative % 41 (*)    Lymphocytes Relative 50 (*)    All other components within normal limits  I-STAT CHEM 8, ED - Abnormal; Notable for the following:    Potassium 3.0 (*)    Creatinine, Ser 1.30 (*)    Glucose, Bld 147 (*)    All other components within normal limits  ETHANOL  PROTIME-INR  APTT  URINE RAPID DRUG SCREEN (HOSP PERFORMED)  URINALYSIS, ROUTINE W REFLEX MICROSCOPIC  CK TOTAL AND CKMB  TROPONIN I  CBC  DIFFERENTIAL  COMPREHENSIVE METABOLIC PANEL  CBC WITH DIFFERENTIAL  TSH  HEMOGLOBIN A1C  LIPID PANEL  CBC  I-STAT TROPOININ, ED  I-STAT TROPOININ, ED  POC URINE PREG, ED  CBG MONITORING, ED    Imaging Review Ct Angio Head W/cm &/or Wo Cm  07/16/2014   CLINICAL DATA:  Stroke.  EXAM: CT ANGIOGRAPHY HEAD AND NECK  TECHNIQUE: Multidetector CT imaging of the head and neck was performed using the standard protocol during bolus administration of intravenous contrast. Multiplanar CT image reconstructions and MIPs were obtained to evaluate the vascular anatomy. Carotid stenosis measurements (when applicable) are obtained utilizing NASCET criteria, using the distal internal carotid diameter as the denominator.  CONTRAST:  50mL OMNIPAQUE IOHEXOL 300 MG/ML  SOLN  COMPARISON:  MRI of the head July 16, 2014  FINDINGS: CTA HEAD FINDINGS  Anterior circulation: Normal appearance of the cervical internal carotid arteries, petrous, cavernous and supra clinoid internal carotid arteries. Widely patent anterior communicating artery. Focal occlusion of right M2/3 insular branch, with reconstitution and subsequent high-grade stenosis versus occlusion. In addition, relative asymmetric decreased number of distal right anterior cerebral artery branches, axial 8/59.  Posterior circulation: Left vertebral artery is dominant, with normal  appearance of the vertebral arteries, vertebrobasilar junction and basilar artery, as well as main branch vessels. Small bilateral posterior communicating arteries are present. Normal appearance of the posterior cerebral arteries.  No large vessel occlusion, dissection, contrast extravasation or aneurysm within the anterior nor posterior circulation.  Right parietal transcortical encephalomalacia. No intraparenchymal hemorrhage, mass effect or midline shift. Right choroidal fissure cyst.  Review of the MIP images confirms the above findings.  CTA NECK FINDINGS  Normal appearance of the thoracic arch, two-vessel arch is a normal variant. The origins of the innominate, left Common carotid artery and subclavian artery are widely patent.  Bilateral Common carotid arteries are widely patent, coursing in a straight line fashion. Normal appearance of the carotid bifurcations without hemodynamically significant stenosis by NASCET criteria. Normal appearance of the included internal carotid arteries.  Left vertebral artery is dominant. Normal appearance of the vertebral arteries, which appear widely patent.  No hemodynamically significant stenosis by NASCET criteria. No dissection, no pseudoaneurysm. No abnormal luminal irregularity. No contrast extravasation.  Soft tissues are unremarkable. No acute osseous process though bone windows have not been submitted.  Review of the MIP images confirms the above findings.  IMPRESSION: CTA head:  Intermittent occlusion of right middle cerebral artery insular branch, which could reflect thromboembolic disease or possible vasculopathy. In addition, relative asymmetric, decreased distal right anterior cerebral artery vessels, equivocal for clinically significant finding, could reflect normal variant. Findings may be better characterized on MRI of the head with and without contrast including gradient sequences.  Right parietal transcortical encephalomalacia may reflect remote infarct or  possibly head trauma.  CTA neck:  Normal CT angiogram of the neck.  Preliminary findings discussed with and reconfirmed by Dr.MCNEILL [facility] on7/22/2015at2230 hr.   Electronically Signed   By: Awilda Metro   On: 07/16/2014 23:45   Ct Head Wo Contrast  07/16/2014   CLINICAL DATA:  Left-sided weakness. LEFT arm weakness. Code stroke.  EXAM: CT HEAD WITHOUT CONTRAST  TECHNIQUE: Contiguous axial images were obtained from the base of the skull through the vertex without intravenous contrast.  COMPARISON:  None.  FINDINGS: No mass lesion, mass effect, midline shift, hydrocephalus, hemorrhage. No territorial ischemia or acute infarction. RIGHT choroidal fissure cyst incidentally noted. Paranasal sinuses appear within normal limits. Calvarium intact.  IMPRESSION: Negative CT head. Critical Value/emergent results were called by telephone at the time of interpretation on 07/16/2014 at 8:39 pm to Dr. Amada Jupiter , who verbally acknowledged these results.   Electronically Signed   By: Andreas Newport M.D.   On: 07/16/2014 20:40   Ct Angio Neck W/cm &/or Wo/cm  07/16/2014   CLINICAL DATA:  Stroke.  EXAM: CT ANGIOGRAPHY HEAD AND NECK  TECHNIQUE: Multidetector CT imaging of the head and neck was performed using the standard protocol during bolus administration of intravenous contrast. Multiplanar CT image reconstructions and MIPs were obtained to evaluate the vascular anatomy. Carotid stenosis measurements (when applicable) are obtained utilizing NASCET criteria, using the distal internal carotid diameter as the denominator.  CONTRAST:  50mL OMNIPAQUE IOHEXOL 300 MG/ML  SOLN  COMPARISON:  MRI of the head July 16, 2014  FINDINGS: CTA HEAD FINDINGS  Anterior circulation: Normal appearance of the cervical internal carotid arteries, petrous, cavernous and supra clinoid internal carotid arteries. Widely patent anterior communicating artery. Focal occlusion of right M2/3 insular branch, with reconstitution and subsequent  high-grade stenosis versus occlusion. In addition, relative asymmetric decreased number of distal right anterior cerebral artery branches, axial 8/59.  Posterior circulation: Left vertebral artery is dominant, with normal appearance of the vertebral arteries, vertebrobasilar junction and basilar artery, as well as main branch vessels. Small bilateral posterior communicating arteries are present. Normal appearance of the posterior cerebral arteries.  No large vessel occlusion, dissection, contrast extravasation or aneurysm within the anterior nor posterior circulation.  Right parietal transcortical encephalomalacia. No intraparenchymal hemorrhage, mass effect or midline shift. Right choroidal fissure cyst.  Review of the MIP images confirms the above findings.  CTA NECK FINDINGS  Normal appearance of the thoracic arch, two-vessel arch is a normal variant. The origins of the innominate, left Common carotid artery and subclavian artery are widely patent.  Bilateral Common carotid arteries are widely patent, coursing in a straight line fashion. Normal appearance of the carotid bifurcations without hemodynamically significant stenosis by NASCET criteria. Normal appearance of the included internal carotid arteries.  Left vertebral artery is dominant. Normal appearance of the vertebral arteries, which appear widely patent.  No hemodynamically significant stenosis by NASCET criteria. No dissection, no pseudoaneurysm. No abnormal luminal irregularity. No contrast extravasation.  Soft tissues are unremarkable. No acute osseous process though bone windows have not been submitted.  Review of the MIP images confirms the above findings.  IMPRESSION: CTA head: Intermittent occlusion of right middle cerebral artery insular branch, which could reflect thromboembolic disease or possible vasculopathy. In addition, relative asymmetric, decreased distal right anterior cerebral artery vessels, equivocal for clinically significant  finding, could reflect normal variant. Findings may be better characterized on MRI of the head with and without contrast including gradient sequences.  Right parietal transcortical encephalomalacia may reflect remote infarct or possibly head trauma.  CTA neck:  Normal CT angiogram of the neck.  Preliminary findings discussed with and reconfirmed by Dr.MCNEILL [facility] on7/22/2015at2230 hr.   Electronically Signed   By: Awilda Metro   On: 07/16/2014 23:45   Mr Brain Wo Contrast  07/16/2014   CLINICAL DATA:  Left-sided weakness, numbness and new onset confusion.  EXAM: MRI HEAD WITHOUT CONTRAST  TECHNIQUE: Multiplanar, multiecho pulse sequences of the brain and surrounding structures were obtained without intravenous contrast.  COMPARISON:  Head CT same day  FINDINGS: Diffusion imaging in the axial coronal planes shows multiple punctate foci of restricted diffusion within the right middle cerebral artery territory affecting the posterior temporal and parietal regions. The largest insult is only about 3 mm in size. No sign of swelling or hemorrhage. No lesion seen in left hemisphere or in the cerebellum or affecting the brainstem.  No evidence of hydrocephalus. No evidence of mass lesion or extra-axial collection.  IMPRESSION: Numerous punctate acute infarctions in the right posterior temporal and parietal region consistent with micro embolic infarctions in the right middle cerebral artery territory.   Electronically Signed   By: Paulina Fusi M.D.   On: 07/16/2014 21:25     EKG Interpretation   Date/Time:  Wednesday July 16 2014 21:29:28 EDT Ventricular Rate:  114 PR Interval:  165 QRS Duration: 87 QT Interval:  352 QTC Calculation: 485 R Axis:   61 Text Interpretation:  Sinus tachycardia Probable left atrial enlargement  Borderline Q waves in inferior leads Borderline prolonged QT interval  Confirmed by Rhunette Croft, MD, Janey Genta [id number]) on 07/16/2014 9:32:09 PM      MDM   Final diagnoses:   Cerebral infarction due to embolism of right middle cerebral artery   28 year old female with history of smoking and taking birth control the presents with sudden neurological complaint. Patient noted to have sudden weakness of the left arm with decreased sensation. As a result code stroke was initiated neurology came to bedside. Patient underwent a CT head which demonstrated no focal findings. Patient had then underwent MRI which showed findings of numerous punctate acute infarctions in the right posterior temporal and parietal regions. As result of these findings the patient was admitted to the hospitalist service for further workup of her stroke illness. Patient remained hemodynamically stable throughout her stay in the emergency department and was transferred without incident    Clement Sayres, MD 07/18/14 [phone]

## 2014-07-17 NOTE — H&P (Signed)
[facility] History and Physical  Cristina [name] DGU:[id number] DOB: 07/10/1986 DOA: 07/16/2014  Referring physician: ER physician. PCP: No PCP Per Patient  Chief Complaint: Left upper extremity weakness.  HPI: Cristina [name] is a 28 y.o. female with history of migraine presents with complaints of left upper extremity weakness. At around 7:45 PM last night patient started initially having left facial droop followed by left upper extremity weakness. Patient was brought to the ER. CT of the head did not show anything acute and MRI showed multiple acute infarctions involving the right middle cerebral artery distribution. On-call neurologist Dr. Amada JupiterKirkpatrick was consulted and since patient's symptoms improved rapidly patient was felt not be a candidate for TPA. Patient otherwise denies any visual symptoms difficulty speaking swallowing or any weakness of the other extremities. Patient has had headache yesterday but denies any headache today.   Review of Systems: As presented in the history of presenting illness, rest negative.  History reviewed. No pertinent past medical history. Past Surgical History  Procedure Laterality Date  . Right ear surgery     Social History:  reports that she has been smoking.  She does not have any smokeless tobacco history on file. She reports that she drinks alcohol. She reports that she does not use illicit drugs. Where does patient live home. Can patient participate in ADLs? Yes.  Not on File  Family History:  Family History  Problem Relation Age of Onset  . CAD Mother   . Stroke Other       Prior to Admission medications   Medication Sig Start Date End Date Taking? Authorizing Provider  loratadine (CLARITIN) 10 MG tablet Take 10 mg by mouth daily.   Yes Historical Provider, MD  Norgestimate-Ethinyl Estradiol Triphasic (ORTHO TRI-CYCLEN LO) 0.18/0.215/0.25 MG-25 MCG tab Take 1 tablet by mouth daily.   Yes Historical Provider, MD    Physical  Exam: Filed Vitals:   07/17/14 0000 07/17/14 0015 07/17/14 0030 07/17/14 0051  BP: 132/89 120/77 120/77 116/72  Pulse: 70 81 79 63  Temp:    98.7 F (37.1 C)  TempSrc:    Oral  Resp: 17 17 22 18   Height:    5\' 2"  (1.575 m)  Weight:    47.991 kg (105 lb 12.8 oz)  SpO2: 98% 99% 98% 98%     General:  Moderately built and nourished.  Eyes: Anicteric no pallor.  ENT: No discharge from the ears eyes nose mouth.  Neck: No mass felt.  Cardiovascular: S1-S2 heard.  Respiratory: No rhonchi or crepitations.  Abdomen: Soft nontender bowel sounds present. No guarding or rigidity.  Skin: No rash.  Musculoskeletal: No edema.  Psychiatric: Appears normal.  Neurologic:  Alert awake oriented to time place and person. Left upper extremity is 4 x 5 in strength. Rest of the extremities has 5 x 5 strength. No facial asymmetric. Tongue is midline. PERRLA positive.  Labs on Admission:  Basic Metabolic Panel:  Recent Labs Lab 07/16/14 2024 07/16/14 2032  NA 138 138  K 3.3* 3.0*  CL 101 102  CO2 22  --   GLUCOSE 145* 147*  BUN 9 7  CREATININE 1.13* 1.30*  CALCIUM 9.2  --    Liver Function Tests:  Recent Labs Lab 07/16/14 2024  AST 20  ALT 11  ALKPHOS 46  BILITOT 0.5  PROT 7.7  ALBUMIN 3.8   No results found for this basename: LIPASE, AMYLASE,  in the last 168 hours No results found for this basename: AMMONIA,  in the last 168 hours CBC:  Recent Labs Lab 07/16/14 2024 07/16/14 2032  WBC 6.6  --   NEUTROABS 2.7  --   HGB 13.0 13.6  HCT 37.3 40.0  MCV 93.5  --   PLT 176  --    Cardiac Enzymes:  Recent Labs Lab 07/16/14 2024  CKTOTAL 103  CKMB 1.4  TROPONINI <0.30    BNP (last 3 results) No results found for this basename: PROBNP,  in the last 8760 hours CBG:  Recent Labs Lab 07/16/14 2128  GLUCAP 94    Radiological Exams on Admission: Ct Angio Head W/cm &/or Wo Cm  07/16/2014   CLINICAL DATA:  Stroke.  EXAM: CT ANGIOGRAPHY HEAD AND NECK   TECHNIQUE: Multidetector CT imaging of the head and neck was performed using the standard protocol during bolus administration of intravenous contrast. Multiplanar CT image reconstructions and MIPs were obtained to evaluate the vascular anatomy. Carotid stenosis measurements (when applicable) are obtained utilizing NASCET criteria, using the distal internal carotid diameter as the denominator.  CONTRAST:  50mL OMNIPAQUE IOHEXOL 300 MG/ML  SOLN  COMPARISON:  MRI of the head July 16, 2014  FINDINGS: CTA HEAD FINDINGS  Anterior circulation: Normal appearance of the cervical internal carotid arteries, petrous, cavernous and supra clinoid internal carotid arteries. Widely patent anterior communicating artery. Focal occlusion of right M2/3 insular branch, with reconstitution and subsequent high-grade stenosis versus occlusion. In addition, relative asymmetric decreased number of distal right anterior cerebral artery branches, axial 8/59.  Posterior circulation: Left vertebral artery is dominant, with normal appearance of the vertebral arteries, vertebrobasilar junction and basilar artery, as well as main branch vessels. Small bilateral posterior communicating arteries are present. Normal appearance of the posterior cerebral arteries.  No large vessel occlusion, dissection, contrast extravasation or aneurysm within the anterior nor posterior circulation.  Right parietal transcortical encephalomalacia. No intraparenchymal hemorrhage, mass effect or midline shift. Right choroidal fissure cyst.  Review of the MIP images confirms the above findings.  CTA NECK FINDINGS  Normal appearance of the thoracic arch, two-vessel arch is a normal variant. The origins of the innominate, left Common carotid artery and subclavian artery are widely patent.  Bilateral Common carotid arteries are widely patent, coursing in a straight line fashion. Normal appearance of the carotid bifurcations without hemodynamically significant stenosis by  NASCET criteria. Normal appearance of the included internal carotid arteries.  Left vertebral artery is dominant. Normal appearance of the vertebral arteries, which appear widely patent.  No hemodynamically significant stenosis by NASCET criteria. No dissection, no pseudoaneurysm. No abnormal luminal irregularity. No contrast extravasation.  Soft tissues are unremarkable. No acute osseous process though bone windows have not been submitted.  Review of the MIP images confirms the above findings.  IMPRESSION: CTA head: Intermittent occlusion of right middle cerebral artery insular branch, which could reflect thromboembolic disease or possible vasculopathy. In addition, relative asymmetric, decreased distal right anterior cerebral artery vessels, equivocal for clinically significant finding, could reflect normal variant. Findings may be better characterized on MRI of the head with and without contrast including gradient sequences.  Right parietal transcortical encephalomalacia may reflect remote infarct or possibly head trauma.  CTA neck:  Normal CT angiogram of the neck.  Preliminary findings discussed with and reconfirmed by Dr.MCNEILL [facility] on7/22/2015at2230 hr.   Electronically Signed   By: Awilda Metro   On: 07/16/2014 23:45   Ct Head Wo Contrast  07/16/2014   CLINICAL DATA:  Left-sided weakness. LEFT arm weakness. Code stroke.  EXAM:  CT HEAD WITHOUT CONTRAST  TECHNIQUE: Contiguous axial images were obtained from the base of the skull through the vertex without intravenous contrast.  COMPARISON:  None.  FINDINGS: No mass lesion, mass effect, midline shift, hydrocephalus, hemorrhage. No territorial ischemia or acute infarction. RIGHT choroidal fissure cyst incidentally noted. Paranasal sinuses appear within normal limits. Calvarium intact.  IMPRESSION: Negative CT head. Critical Value/emergent results were called by telephone at the time of interpretation on 07/16/2014 at 8:39 pm to Dr. Amada Jupiter ,  who verbally acknowledged these results.   Electronically Signed   By: Andreas Newport M.D.   On: 07/16/2014 20:40   Ct Angio Neck W/cm &/or Wo/cm  07/16/2014   CLINICAL DATA:  Stroke.  EXAM: CT ANGIOGRAPHY HEAD AND NECK  TECHNIQUE: Multidetector CT imaging of the head and neck was performed using the standard protocol during bolus administration of intravenous contrast. Multiplanar CT image reconstructions and MIPs were obtained to evaluate the vascular anatomy. Carotid stenosis measurements (when applicable) are obtained utilizing NASCET criteria, using the distal internal carotid diameter as the denominator.  CONTRAST:  50mL OMNIPAQUE IOHEXOL 300 MG/ML  SOLN  COMPARISON:  MRI of the head July 16, 2014  FINDINGS: CTA HEAD FINDINGS  Anterior circulation: Normal appearance of the cervical internal carotid arteries, petrous, cavernous and supra clinoid internal carotid arteries. Widely patent anterior communicating artery. Focal occlusion of right M2/3 insular branch, with reconstitution and subsequent high-grade stenosis versus occlusion. In addition, relative asymmetric decreased number of distal right anterior cerebral artery branches, axial 8/59.  Posterior circulation: Left vertebral artery is dominant, with normal appearance of the vertebral arteries, vertebrobasilar junction and basilar artery, as well as main branch vessels. Small bilateral posterior communicating arteries are present. Normal appearance of the posterior cerebral arteries.  No large vessel occlusion, dissection, contrast extravasation or aneurysm within the anterior nor posterior circulation.  Right parietal transcortical encephalomalacia. No intraparenchymal hemorrhage, mass effect or midline shift. Right choroidal fissure cyst.  Review of the MIP images confirms the above findings.  CTA NECK FINDINGS  Normal appearance of the thoracic arch, two-vessel arch is a normal variant. The origins of the innominate, left Common carotid artery and  subclavian artery are widely patent.  Bilateral Common carotid arteries are widely patent, coursing in a straight line fashion. Normal appearance of the carotid bifurcations without hemodynamically significant stenosis by NASCET criteria. Normal appearance of the included internal carotid arteries.  Left vertebral artery is dominant. Normal appearance of the vertebral arteries, which appear widely patent.  No hemodynamically significant stenosis by NASCET criteria. No dissection, no pseudoaneurysm. No abnormal luminal irregularity. No contrast extravasation.  Soft tissues are unremarkable. No acute osseous process though bone windows have not been submitted.  Review of the MIP images confirms the above findings.  IMPRESSION: CTA head: Intermittent occlusion of right middle cerebral artery insular branch, which could reflect thromboembolic disease or possible vasculopathy. In addition, relative asymmetric, decreased distal right anterior cerebral artery vessels, equivocal for clinically significant finding, could reflect normal variant. Findings may be better characterized on MRI of the head with and without contrast including gradient sequences.  Right parietal transcortical encephalomalacia may reflect remote infarct or possibly head trauma.  CTA neck:  Normal CT angiogram of the neck.  Preliminary findings discussed with and reconfirmed by Dr.MCNEILL [facility] on7/22/2015at2230 hr.   Electronically Signed   By: Awilda Metro   On: 07/16/2014 23:45   Mr Brain Wo Contrast  07/16/2014   CLINICAL DATA:  Left-sided weakness, numbness and new  onset confusion.  EXAM: MRI HEAD WITHOUT CONTRAST  TECHNIQUE: Multiplanar, multiecho pulse sequences of the brain and surrounding structures were obtained without intravenous contrast.  COMPARISON:  Head CT same day  FINDINGS: Diffusion imaging in the axial coronal planes shows multiple punctate foci of restricted diffusion within the right middle cerebral artery territory  affecting the posterior temporal and parietal regions. The largest insult is only about 3 mm in size. No sign of swelling or hemorrhage. No lesion seen in left hemisphere or in the cerebellum or affecting the brainstem.  No evidence of hydrocephalus. No evidence of mass lesion or extra-axial collection.  IMPRESSION: Numerous punctate acute infarctions in the right posterior temporal and parietal region consistent with micro embolic infarctions in the right middle cerebral artery territory.   Electronically Signed   By: Paulina Fusi M.D.   On: 07/16/2014 21:25    EKG: Independently reviewed. Sinus tachycardia.  Assessment/Plan Principal Problem:   CVA (cerebral infarction) Active Problems:   Stroke   1. CVA - at this time neurologist has ordered MRI brain with and without contrast and MRV. Patient has had CT angiogram of the head and neck so carotid Doppler has not been ordered. Check 2-D echo. Monitor in telemetry. Check hemoglobin A1c lipid panel. Neuro checks. Aspirin. 2. Tobacco abuse - tobacco cessation counseling requested. 3. Mild renal failure - closely follow intake output and metabolic panel.    Code Status: Full code.  Family Communication: None.  Disposition Plan: Admit to patient.    Rashon Rezek N. [facility] Pager [phone].  If 7PM-7AM, please contact night-coverage [email or web address] Password [facility] 07/17/2014, 12:54 AM

## 2014-07-18 ENCOUNTER — Encounter (HOSPITAL_COMMUNITY)
Admission: EM | Disposition: A | Discharge status: Home / Self Care | Payer: Self-pay | Source: Home / Self Care | Attending: Internal Medicine

## 2014-07-18 ENCOUNTER — Encounter (HOSPITAL_COMMUNITY): Payer: Self-pay | Admitting: *Deleted

## 2014-07-18 DIAGNOSIS — I6340 Cerebral infarction due to embolism of unspecified cerebral artery: Principal | ICD-10-CM

## 2014-07-18 HISTORY — PX: TEE WITHOUT CARDIOVERSION: SHX5443

## 2014-07-18 LAB — BETA-2-GLYCOPROTEIN I ABS, IGG/M/A
BETA 2 GLYCO I IGG: 13 G Units (ref <20)
BETA-2-GLYCOPROTEIN I IGA: 9 A Units (ref <20)
Beta-2-Glycoprotein I IgM: 2 M Units (ref <20)

## 2014-07-18 LAB — LUPUS ANTICOAGULANT PANEL
DRVVT: 31.4 secs (ref <42.9)
LUPUS ANTICOAGULANT: NOT DETECTED
PTT Lupus Anticoagulant: 38.4 secs (ref 28.0–43.0)

## 2014-07-18 LAB — CBC
HCT: 32.6 % — ABNORMAL LOW (ref 36.0–46.0)
Hemoglobin: 11.2 g/dL — ABNORMAL LOW (ref 12.0–15.0)
MCH: 32.3 pg (ref 26.0–34.0)
MCHC: 34.4 g/dL (ref 30.0–36.0)
MCV: 93.9 fL (ref 78.0–100.0)
Platelets: 147 10*3/uL — ABNORMAL LOW (ref 150–400)
RBC: 3.47 MIL/uL — AB (ref 3.87–5.11)
RDW: 12.4 % (ref 11.5–15.5)
WBC: 5 10*3/uL (ref 4.0–10.5)

## 2014-07-18 LAB — HOMOCYSTEINE: Homocysteine: 11.7 umol/L (ref 4.0–15.4)

## 2014-07-18 LAB — ANCA SCREEN W REFLEX TITER
Atypical p-ANCA Screen: NEGATIVE
c-ANCA Screen: NEGATIVE
p-ANCA Screen: NEGATIVE

## 2014-07-18 LAB — SJOGRENS SYNDROME-B EXTRACTABLE NUCLEAR ANTIBODY: SSB (La) (ENA) Antibody, IgG: <1

## 2014-07-18 LAB — C4 COMPLEMENT: Complement C4, Body Fluid: 18 mg/dL (ref 10–40)

## 2014-07-18 LAB — PROTEIN C, TOTAL: PROTEIN C, TOTAL: 76 % (ref 72–160)

## 2014-07-18 LAB — HIV ANTIBODY (ROUTINE TESTING W REFLEX): HIV 1&2 Ab, 4th Generation: NONREACTIVE

## 2014-07-18 LAB — C3 COMPLEMENT: C3 COMPLEMENT: 88 mg/dL — AB (ref 90–180)

## 2014-07-18 LAB — ANTI-NUCLEAR AB-TITER (ANA TITER): ANA Titer 1: NEGATIVE

## 2014-07-18 LAB — CARDIOLIPIN ANTIBODIES, IGG, IGM, IGA
Anticardiolipin IgA: 2 APL U/mL — ABNORMAL LOW (ref <22)
Anticardiolipin IgG: 6 GPL U/mL — ABNORMAL LOW (ref <23)
Anticardiolipin IgM: 3 MPL U/mL — ABNORMAL LOW (ref <11)

## 2014-07-18 LAB — RPR

## 2014-07-18 LAB — PROTEIN S ACTIVITY: PROTEIN S ACTIVITY: 61 % — AB (ref 69–129)

## 2014-07-18 LAB — PROTEIN C ACTIVITY: Protein C Activity: 114 % (ref 75–133)

## 2014-07-18 LAB — ANA: Anti Nuclear Antibody(ANA): POSITIVE — AB

## 2014-07-18 LAB — PROTEIN S, TOTAL: PROTEIN S AG TOTAL: 58 % — AB (ref 60–150)

## 2014-07-18 LAB — SJOGRENS SYNDROME-A EXTRACTABLE NUCLEAR ANTIBODY: SSA (Ro) (ENA) Antibody, IgG: <1

## 2014-07-18 SURGERY — ECHOCARDIOGRAM, TRANSESOPHAGEAL
Anesthesia: Moderate Sedation

## 2014-07-18 MED ORDER — BUTAMBEN-TETRACAINE-BENZOCAINE 2-2-14 % EX AERO
INHALATION_SPRAY | CUTANEOUS | Status: DC | PRN
  Administered 2014-07-18: 2 via TOPICAL

## 2014-07-18 MED ORDER — LIDOCAINE VISCOUS 2 % MT SOLN
OROMUCOSAL | Status: DC | PRN
  Administered 2014-07-18: 10 mL via OROMUCOSAL

## 2014-07-18 MED ORDER — LIDOCAINE VISCOUS 2 % MT SOLN
OROMUCOSAL | Status: AC
  Filled 2014-07-18: qty 15

## 2014-07-18 MED ORDER — NICOTINE 21 MG/24HR TD PT24
21.0000 mg | MEDICATED_PATCH | Freq: Every day | TRANSDERMAL | Status: DC
  Administered 2014-07-18 – 2014-07-19 (×2): 21 mg via TRANSDERMAL
  Filled 2014-07-18 (×2): qty 1

## 2014-07-18 MED ORDER — DIPHENHYDRAMINE HCL 50 MG/ML IJ SOLN
INTRAMUSCULAR | Status: AC
  Filled 2014-07-18: qty 1

## 2014-07-18 MED ORDER — SODIUM CHLORIDE 0.9 % IV SOLN
INTRAVENOUS | Status: DC
  Administered 2014-07-18: 500 mL via INTRAVENOUS

## 2014-07-18 MED ORDER — MIDAZOLAM HCL 10 MG/2ML IJ SOLN
INTRAMUSCULAR | Status: DC | PRN
  Administered 2014-07-18 (×2): 2 mg via INTRAVENOUS

## 2014-07-18 MED ORDER — MIDAZOLAM HCL 5 MG/ML IJ SOLN
INTRAMUSCULAR | Status: AC
  Filled 2014-07-18: qty 2

## 2014-07-18 MED ORDER — FENTANYL CITRATE 0.05 MG/ML IJ SOLN
INTRAMUSCULAR | Status: AC
  Filled 2014-07-18: qty 4

## 2014-07-18 MED ORDER — FENTANYL CITRATE 0.05 MG/ML IJ SOLN
INTRAMUSCULAR | Status: DC | PRN
  Administered 2014-07-18 (×2): 25 ug via INTRAVENOUS

## 2014-07-18 MED ORDER — ATORVASTATIN CALCIUM 10 MG PO TABS
10.0000 mg | ORAL_TABLET | Freq: Every day | ORAL | Status: DC
  Administered 2014-07-18 – 2014-07-19 (×2): 10 mg via ORAL
  Filled 2014-07-18 (×2): qty 1

## 2014-07-18 NOTE — Progress Notes (Signed)
Stroke Team Progress Note  HISTORY Cristina [name] is a 28 y.o. female with a history of migraines and anxiety who presents with left arm weakness that started the night of 07/16/2014 at 740 pm. She stated that she noticed that she tried to tell her son something and was unable to say it. Her friend then looked at her mouth and noticed it was drooping and insisted that she come to the ER. She had some improvement after arrival. She denied IV drug use. She denied any drug use other than occasional marijuana. She does take birth control and smokes. She has a history of migraines and had one the night prior to admission, but no headache when initially seen. She stated that it was her typical migraine. She stated taht she does get headacehs several times per month, but that they are much better than when she was younger. She had some shortness of breath en route, but no further symptoms. Patient was not administered TPA secondary to mild improving symptoms.  She was admitted for further evaluation and treatment.  SUBJECTIVE Patient's husband was at the bedside. They feel that the patient is getting better. TEE planned for today.  OBJECTIVE Most recent Vital Signs: Filed Vitals:   07/18/14 0000 07/18/14 0544 07/18/14 0808 07/18/14 1155  BP: 120/59 110/67 103/63 115/64  Pulse: 51 59 49 63  Temp: 98.5 F (36.9 C) 98.3 F (36.8 C) 98.5 F (36.9 C) 97.4 F (36.3 C)  TempSrc: Oral Oral Oral Oral  Resp: 18 18 16 16   Height:      Weight:  105 lb 6 oz (47.799 kg)    SpO2: 97% 97% 100% 100%   CBG (last 3)   Recent Labs  07/16/14 2128  GLUCAP 94    IV Fluid Intake:      MEDICATIONS  . aspirin  300 mg Rectal Daily   Or  . aspirin  325 mg Oral Daily  . enoxaparin (LOVENOX) injection  40 mg Subcutaneous Q24H  . loratadine  10 mg Oral Daily  . nicotine  21 mg Transdermal Daily   PRN:  senna-docusate  Diet:  NPO today for TEE Activity:  OOB with assistance DVT Prophylaxis:  Lovenox 40 mg  sq daily   CLINICALLY SIGNIFICANT STUDIES Basic Metabolic Panel:   Recent Labs Lab 07/16/14 2024 07/16/14 2032 07/17/14 0355  NA 138 138 142  K 3.3* 3.0* 4.1  CL 101 102 108  CO2 22  --  22  GLUCOSE 145* 147* 117*  BUN 9 7 8   CREATININE 1.13* 1.30* 0.89  CALCIUM 9.2  --  8.5   Liver Function Tests:   Recent Labs Lab 07/16/14 2024 07/17/14 0355  AST 20 16  ALT 11 9  ALKPHOS 46 37*  BILITOT 0.5 0.4  PROT 7.7 6.3  ALBUMIN 3.8 3.2*   CBC:   Recent Labs Lab 07/16/14 2024  07/17/14 0355 07/18/14 0701  WBC 6.6  --  6.1 5.0  NEUTROABS 2.7  --  3.5  --   HGB 13.0  < > 11.6* 11.2*  HCT 37.3  < > 33.7* 32.6*  MCV 93.5  --  94.1 93.9  PLT 176  --  153 147*  < > = values in this interval not displayed. Coagulation:   Recent Labs Lab 07/16/14 2024  LABPROT 12.6  INR 0.94   Cardiac Enzymes:   Recent Labs Lab 07/16/14 2024  CKTOTAL 103  CKMB 1.4  TROPONINI <0.30   Urinalysis:   Recent  Labs Lab 07/16/14 2153  COLORURINE YELLOW  LABSPEC 1.008  PHURINE 6.0  GLUCOSEU NEGATIVE  HGBUR NEGATIVE  BILIRUBINUR NEGATIVE  KETONESUR NEGATIVE  PROTEINUR NEGATIVE  UROBILINOGEN 0.2  NITRITE NEGATIVE  LEUKOCYTESUR NEGATIVE   Lipid Panel    Component Value Date/Time   CHOL 104 07/17/2014 0355   TRIG 54 07/17/2014 0355   HDL 53 07/17/2014 0355   CHOLHDL 2.0 07/17/2014 0355   VLDL 11 07/17/2014 0355   LDLCALC 40 07/17/2014 0355   HgbA1C  Lab Results  Component Value Date   HGBA1C 5.5 07/17/2014    Urine Drug Screen:     Component Value Date/Time   LABOPIA NONE DETECTED 07/16/2014 2153   COCAINSCRNUR NONE DETECTED 07/16/2014 2153   LABBENZ NONE DETECTED 07/16/2014 2153   AMPHETMU NONE DETECTED 07/16/2014 2153   THCU NONE DETECTED 07/16/2014 2153   LABBARB NONE DETECTED 07/16/2014 2153    Alcohol Level:   Recent Labs Lab 07/16/14 2024  ETH <11    CT Angio Head  07/16/2014    Intermittent occlusion of right middle cerebral artery insular branch, which could  reflect thromboembolic disease or possible vasculopathy. In addition, relative asymmetric, decreased distal right anterior cerebral artery vessels, equivocal for clinically significant finding, could reflect normal variant. Findings may be better characterized on MRI of the head with and without contrast including gradient sequences.  Right parietal transcortical encephalomalacia may reflect remote infarct or possibly head trauma.   CT Angio Neck  07/16/2014   Normal CT angiogram of the neck.    CT of the brain  07/16/2014    Negative CT head.   MRI of the brain without contrast   07/16/2014    Numerous punctate acute infarctions in the right posterior temporal and parietal region consistent with micro embolic infarctions in the right middle cerebral artery territory.    MRI of the brain 07/17/2014 1. Interval progression of right MCA infarcts. Right operculum and parietal lobe confluent [name] affected now. Small cortical infarcts in the peri-Rolandic region.  2. New left superior frontal gyrus, left ACA - left MCA watershed type, infarct. Small right occipital lacunar infarcts, possibly also  in the posterior left temporal lobe.  3. The constellation of recent CTA and MRI data suggests a proximal aortic or cardiac source of emboli.  4. No mass effect or hemorrhage. No abnormal enhancement.  5. Major vascular flow voids appear stable to the recent CTA, including evidence of abnormal flow in the right MCA dominant  posterior sylvian branch.  Lower extremity Dopplers 07/17/2014 No evidence of deep vein or superficial thrombosis involving the right lower extremity and left lower extremity. - . Incidental findings are consistent with: Baker's Cyst on the left. - No evidence of Baker&'s cyst on the right.  2D Echocardiogram - ejection fraction 55-60%. No cardiac source of emboli identified.  CXR - No active cardiopulmonary disease.  EKG - sinus tachycardia. For complete results please see formal  report.   TCD with bubble 07/17/2014 Impression: negative Transcranial Doppler Bubble Study indicative of no right to left shunt.  Hypercoagulable Panel ANA - positive C3 complement - 88 low C4 complement - 18 within normal limits HIV - nonreactive Antithrombin III - 85 within normal limits Serum homocystine - 11.7 within normal limits RPR - nonreactive Sedimentation rate - 11 within normal limits Antinuclear body titer - negative Total complement - pending Protein C - pending Protein S. - pending Lupus anticoagulant - pending Prothrombin gene mutation - pending Cardiolipin antibodies - pending  Factor V Leiden - pending  Beta-2 glycoprotein - pending ANCA screen - pending Sojourn syndrome - pending  Therapy Recommendations - no followup PT recommended. Outpatient OT along with 24-hour per day supervision/assistance.  Physical Exam    Temp:  [97.4 F (36.3 C)-98.5 F (36.9 C)] 97.4 F (36.3 C) (07/24 1155) Pulse Rate:  [49-63] 63 (07/24 1155) Resp:  [15-18] 16 (07/24 1155) BP: (103-120)/(59-72) 115/64 mmHg (07/24 1155) SpO2:  [97 %-100 %] 100 % (07/24 1155) Weight:  [105 lb 6 oz (47.799 kg)] 105 lb 6 oz (47.799 kg) (07/24 0544)  General - Well nourished, well developed, in no apparent distress.  Ophthalmologic - Sharp disc margins OU.  Cardiovascular - Regular rate and rhythm with no murmur.  Mental Status -  Level of arousal and orientation to time, place, and person were intact. Language including expression, naming, repetition, comprehension, reading, and writing was assessed and found intact. Attention span and concentration were normal.  Cranial Nerves II - XII - II - Vision intact OU. III, IV, VI - Extraocular movements intact. V - Facial sensation intact bilaterally. VII - left lower facial weakness. VIII - Hearing & vestibular intact bilaterally. X - Palate elevates symmetrically. XI - Chin turning & shoulder shrug intact bilaterally. XII - Tongue  protrusion intact.  Motor Strength - The patient's strength was 4/5 proximally at left UE, 3/5 distally LUE and 4/5 LLE and pronator drift was present on the left.  Bulk was normal and fasciculations were absent.   Motor Tone - Muscle tone was assessed at the neck and appendages and was normal.  Reflexes - The patient's reflexes were normal in all extremities and she had no pathological reflexes.  Sensory - Light touch decreased on the right.   Coordination - The patient had normal movements in right hand and foot with no ataxia or dysmetria.  Tremor was absent.  Gait and Station - mild hemiparetic gait on the left but able to walk with minimal assistance.   ASSESSMENT Ms. Cristina [name] is a 28 y.o. female presenting with left arm weakness. 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 Patient does have a history of cigarette smoking, OCP use and migraines. She does report a headache the day prior to admission. On no antithrobotics prior to admission. Now on aspirin 325 mg orally every day for secondary stroke prevention. Patient with resultant left hemiparesis (arm>leg), left hemisensory deficits, left lower facial droop. Stroke work up underway. TEE to be done today.   Hx migraine  LDL 40  OCP use Cigarette smoker, advised to stop smoking Urine drug screen negative LE dopplers - negative as above TCD with bubble did not show PFO TSH - 1.06 within normal limits TEE pending  Hospital day # 2  TREATMENT/PLAN  Continue aspirin 325 mg orally every day for secondary stroke prevention.  Low dose lipitor for stroke prevention  MRI repeated since pt had progression of her symptoms. (See above) Interval progression of right MCA infarcts, consistent with cardioembolic stroke.  Cancel MRV (Dr. Roda Shutters discussed with radiology Dr. Mosetta Putt, do not feel there is cortical venous thrombosis) TEE today to look for embolic source.   Check blood  cultures x 2 - still pending Full Hypercoagulable panel, vasculitic panel, include Anca and Sjogren's panels (pending as above) Therapy evals - no further physical therapy. Outpatient occupational therapy recommended Will continue to follow  Delton See PA-C [facility] Pager [phone] 07/18/2014, 1:04 PM  SIGNED I,  the attending vascular neurologist, have personally obtained a history, examined the patient, evaluated laboratory data, individually viewed imaging studies, and formulated the assessment and plan of care.  I have made any additions or clarifications directly to the above note and agree with the findings and plan as currently documented.   Marvel Plan, MD PhD 07/18/2014 6:45 PM  To contact Stroke Continuity provider, please refer to [email or web address]. After hours, contact General Neurology

## 2014-07-18 NOTE — ED Provider Notes (Signed)
I saw and evaluated the patient, reviewed the resident's note and I agree with the findings and plan.   EKG Interpretation   Date/Time:  Wednesday July 16 2014 21:29:28 EDT Ventricular Rate:  114 PR Interval:  165 QRS Duration: 87 QT Interval:  352 QTC Calculation: 485 R Axis:   61 Text Interpretation:  Sinus tachycardia Probable left atrial enlargement  Borderline Q waves in inferior leads Borderline prolonged QT interval  Confirmed by Rhunette CroftNANAVATI, MD, Petrea Fredenburg [phone]) on 07/16/2014 9:32:09 PM      CRITICAL CARE Performed by: Derwood KaplanNanavati, Rivka Baune   Total critical care time: 45 minutes - ACUTE STROKE, TPA CANDIDATE.  Critical care time was exclusive of separately billable procedures and treating other patients.  Critical care was necessary to treat or prevent imminent or life-threatening deterioration.  Critical care was time spent personally by me on the following activities: development of treatment plan with patient and/or surrogate as well as nursing, discussions with consultants, evaluation of patient's response to treatment, examination of patient, obtaining history from patient or surrogate, ordering and performing treatments and interventions, ordering and review of laboratory studies, ordering and review of radiographic studies, pulse oximetry and re-evaluation of patient's condition.  Pt comes in with acute left sided weakness. On exam - LUE weakness, with NIHSS 2 due to the weakness (strength 2/5). Rest of the neuro exam grossly unremarkable. Code stroke called and neurology at bedside.  CT neg for hemorrhage, MRI emergently done, and shows acute stroke. Pt is a TPA candidate. Pt's symptoms slightly improved - and it appears that after the Neurologist reassessed the patient and discussed the findings and TPA pro/cons - TPA WAS DECLINED. CTangio was done emergently - and Neuro cleared for medicine admission.    Derwood KaplanAnkit Bari Handshoe, MD 07/18/14 [phone]

## 2014-07-18 NOTE — Progress Notes (Signed)
Addendum  TEE shows small mobile vegetation on the tip of anterior mitral leaflet. Blood cultures x2 on 07/17/14 are negative to date. Infectious disease consulted and will see on 07/19/14.  Marcellus ScottHONGALGI,Miroslav Gin, MD, FACP, FHM. [facility] Pager [phone]  If 7PM-7AM, please contact night-coverage [email or web address] Password [email or web address] 07/18/2014, 6:44 PM

## 2014-07-18 NOTE — CV Procedure (Addendum)
TRANSESOPHAGEAL ECHOCARDIOGRAM (TEE) NOTE  INDICATIONS: Cryptogenic stroke  PROCEDURE:   Informed consent was obtained prior to the procedure. The risks, benefits and alternatives for the procedure were discussed and the patient comprehended these risks.  Risks include, but are not limited to, cough, sore throat, vomiting, nausea, somnolence, esophageal and stomach trauma or perforation, bleeding, low blood pressure, aspiration, pneumonia, infection, trauma to the teeth and death.    After a procedural time-out, the patient was given 4 mg versed and 50 mcg fentanyl for moderate sedation.  The oropharynx was anesthetized 10 cc of topical 1% viscous lidocaine and 1 cetacaine spray.  The transesophageal probe was inserted in the esophagus and stomach without difficulty and multiple views were obtained.  The patient was kept under observation until the patient left the procedure room.  The patient left the procedure room in stable condition.   Agitated microbubble saline contrast was administered.  COMPLICATIONS:    There were no immediate complications.  Findings:  1. LEFT VENTRICLE: The left ventricular wall thickness is normal.  The left ventricular cavity is normal in size. Wall motion is normal.  LVEF is 55-60%.  2. RIGHT VENTRICLE:  The right ventricle is normal in structure and function without any thrombus or masses.    3. LEFT ATRIUM:  The left atrium is normal in size without any thrombus or masses.  There is not spontaneous echo contrast ("smoke") in the left atrium consistent with a low flow state.  4. LEFT ATRIAL APPENDAGE:  The left atrial appendage is free of any thrombus or masses. The appendage has single lobes. Pulse doppler indicates high flow in the appendage.  5. ATRIAL SEPTUM:  The atrial septum appears intact and is free of thrombus and/or masses.  There is no evidence for interatrial shunting by color doppler and saline microbubble.  6. RIGHT ATRIUM:  The right  atrium is normal in size and function without any thrombus or masses.  7. MITRAL VALVE:  The mitral valve demonstrates a vegetation on the tip of the anterior mitral leaflet, in the A2/A3 commissural region. This is a freely mobile mass, low attenuation signal measuring about 0.5 cm wide on the atrial side of the leaflet. It is well visualized in 2D and 3D images. There is trivial regurgitation.   8. AORTIC VALVE:  The aortic valve is normal in structure and function with no regurgitation.  There were no vegetations or stenosis  9. TRICUSPID VALVE:  The tricuspid valve is normal in structure and function with trivial regurgitation.  There were no vegetations or stenosis  10.  PULMONIC VALVE:  The pulmonic valve is normal in structure and function with trivial regurgitation.  There were no vegetations or stenosis.   11. AORTIC ARCH, ASCENDING AND DESCENDING AORTA:  There was no Myrtis Ser et. Al, 1992) atherosclerosis of the ascending aorta, aortic arch, or proximal descending aorta.  12. PULMONARY VEINS: Anomalous pulmonary venous return was not noted.  13. PERICARDIUM: The pericardium appeared normal and non-thickened.  There is no pericardial effusion.  IMPRESSION:   1. Small mobile vegetation on the tip of the anterior mitral leaflet - this could represent endocarditis or Libman-Sacks endocarditis. 2. No LAA thrombus 3. No PFO or atrial septal defect 4. Normal LV function and wall motion  RECOMMENDATIONS:    1.  Agree with blood cultures if suspicion is higher for infective endocarditis. If negative, consider work-up for Libman-Sacks (non-bacterial thrombotic endocarditis).  Time Spent Directly with the Patient:  45 minutes  Cristina NoseKenneth C. Hilty, MD, [facility] HospitalFACC Attending Cardiologist [facility]  07/18/2014, 2:42 PM

## 2014-07-18 NOTE — Evaluation (Signed)
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 Pt is a smoker on birth control  Clinical Impression  Pt very pleasant with boyfriend present. Pt with lack of sensation LUE, decreased strength C5-T1 distribution of LUE and slightly decreased strength of left vs right hamstring. Pt able to transfer and walk without physical assist and able to visually track with slow speed , no noted balance deficits with mobility although pt does report a feeling of being off balance in sitting at midline. At this time will defer to OT for pt needs. Pt educated for current deficits, stroke risk factors as well as BE FAST (signs and symptoms). Pt verbalized understanding and agreeable to defer to OT. Will sign off.     Follow Up Recommendations No PT follow up    Equipment Recommendations  None recommended by PT    Recommendations for Other Services       Precautions / Restrictions Precautions Precaution Comments: left UE hemiparesis      Mobility  Bed Mobility Overal bed mobility: Modified Independent                Transfers Overall transfer level: Modified independent               General transfer comment: pt with cues to attend to LUE and not let it hang  Ambulation/Gait Ambulation/Gait assistance: Modified independent (Device/Increase time) Ambulation Distance (Feet): 300 Feet         General Gait Details: Pt able to complete gait with horizontal and vertical head turns without LOB  Stairs            Wheelchair Mobility    Modified Rankin (Stroke Patients Only) Modified Rankin (Stroke Patients Only) Pre-Morbid Rankin Score: No symptoms Modified Rankin: Moderate disability     Balance Overall balance assessment: No apparent balance deficits (not  formally assessed)                                           Pertinent Vitals/Pain No pain    Home Living Family/patient expects to be discharged to:: Private residence Living Arrangements: Parent;Children Available Help at Discharge: Family;Available PRN/intermittently Type of Home: House Home Access: Ramped entrance     Home Layout: One level Home Equipment: None Additional Comments: Pt lives with mom and 2yo son    Prior Function Level of Independence: Independent         Comments: Pt is a Leisure centre manager at [employer]   Dominant Hand: Right    Extremity/Trunk Assessment   Upper Extremity Assessment: Defer to OT evaluation           Lower Extremity Assessment: Overall WFL for tasks assessed;LLE deficits/detail   LLE Deficits / Details: Pt reports mild decreased sensation of left thigh vs right but able to sense light touch and 4/5 strength hamstring but all other myotomes 5/5  Cervical / Trunk Assessment: Normal  Communication   Communication: No difficulties  Cognition Arousal/Alertness: Awake/alert Behavior During Therapy: WFL for tasks assessed/performed Overall Cognitive Status: Within Functional Limits for tasks assessed  General Comments      Exercises        Assessment/Plan    PT Assessment Patent does not need any further PT services  PT Diagnosis     PT Problem List    PT Treatment Interventions     PT Goals (Current goals can be found in the Care Plan section) Acute Rehab PT Goals PT Goal Formulation: No goals set, d/c therapy    Frequency     Barriers to discharge        Co-evaluation               End of Session   Activity Tolerance: Patient tolerated treatment well Patient left: in bed;with call bell/phone within reach;with family/visitor present Nurse Communication: Mobility status         Time: 0852-0908 PT Time Calculation (min): 16  min   Charges:   PT Evaluation $Initial PT Evaluation Tier I: 1 Procedure PT Treatments $Therapeutic Activity: 8-22 mins   PT G Codes:          Delorse Lekabor, Philo Kurtz Beth 07/18/2014, 9:15 AM  Delaney MeigsMaija Tabor Jarell Mcewen, PT [phone]

## 2014-07-18 NOTE — H&P (Signed)
     INTERVAL PROCEDURE H&P  History and Physical Interval Note:  07/18/2014 11:51 AM  Cristina [name] has presented today for their planned procedure. The various methods of treatment have been discussed with the patient and family. After consideration of risks, benefits and other options for treatment, the patient has consented to the procedure.  The patients' outpatient history has been reviewed, patient examined, and no change in status from most recent office note within the past 30 days. I have reviewed the patients' chart and labs and will proceed as planned. Questions were answered to the patient's satisfaction.   Cristina NoseKenneth C. Kymir Coles, MD, [facility] Life CenterFACC Attending Cardiologist [facility]  [name] C 07/18/2014, 11:51 AM

## 2014-07-18 NOTE — Progress Notes (Signed)
SLP Cancellation Note  Patient Details Name: [name] MRN: [id number] DOB: 01/14/1986   Cancelled treatment:        Attempted speech-language-cognitive eval.  Pt. Gone to stat ECG.  Will see as soon as able.   Breck CoonsLisa Willis GardenLitaker M.Ed [employer]CCC-SLP Pager [phone]  07/18/2014

## 2014-07-18 NOTE — Progress Notes (Signed)
PROGRESS NOTE    ROX MCGRIFF YNW:[id number] DOB: June 19, 1986 DOA: 07/16/2014 PCP: No PCP Per Patient  HPI/Brief narrative 28 year old white female with history of migraine, presented to the ED on 07/16/14 with acute onset of left-sided weakness & numbness-upper extremity >lower extremity, transient facial droop/slurred speech/swallowing difficulty, without headache which started at approximately 745 PM on 7/22. Patient was not administered TPA secondary to improvement in symptoms. CT head was negative for acute infarct. MRI head showed multiple infarcts in the right MCA territory. Neurology was consulted.    Assessment/Plan:  1. 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 Admitted to telemetry which shows sinus bradycardia in the 40s-50s & SR. SB is probably physiologic for her age. Stroke workup ongoing. Stroke risk factors include cigarette smoking, OCP use and migraines. CTA head showed intermittent occlusion of right MCA insular branch. CTA neck normal. MRI shows progression of right MCA infarct and new left MCA-ACA watershed frontal lobe infarcts. Neurology consultation and followup appreciated-requesting hypercoagulable panel, vasculitic panel-drawn and most results pending.TCD bubble study negative. Lower extremity venous Dopplers without DVT. Scheduled for TEE today. Patient was on no antithrombotic prior to admission and now on aspirin 325 mg daily for secondary stroke prevention. PT evaluated and no further followup. OP input pending. Discussed with neurology who recommends Lipitor 10 mg daily and if TEE negative, DC home and outpatient followup with neurology in one month. Improving. 2. Hypokalemia: Replaced 3. Anemia: Possibly chronic. Stable. Outpatient followup and workup as deemed necessary. 4. History of migraine: Asymptomatic. 5. History of tobacco abuse: Cessation counseled. Smokes one to one and a half packs per day for the last 10  years. Request nicotine patch. 6. History of substance abuse/THC: Cessation counseled. Last done on the day of admission.  7. ANA positive: ? Significance. OP follow up.   Code Status: Full Family Communication: Discussed with boyfriend at bedside. Disposition Plan: DC home later this evening if TEE negative.   Consultants:  Neurology  Cardiology-for TEE  Procedures:  None  Antibiotics:  None   Subjective: Improving strength and sensations in left extremities.  Objective: Filed Vitals:   07/18/14 0000 07/18/14 0544 07/18/14 0808 07/18/14 1155  BP: 120/59 110/67 103/63 115/64  Pulse: 51 59 49 63  Temp: 98.5 F (36.9 C) 98.3 F (36.8 C) 98.5 F (36.9 C) 97.4 F (36.3 C)  TempSrc: Oral Oral Oral Oral  Resp: 18 18 16 16   Height:      Weight:  47.799 kg (105 lb 6 oz)    SpO2: 97% 97% 100% 100%    Intake/Output Summary (Last 24 hours) at 07/18/14 1209 Last data filed at 07/17/14 1401  Gross per 24 hour  Intake      0 ml  Output    500 ml  Net   -500 ml   Filed Weights   07/17/14 0051 07/17/14 0350 07/18/14 0544  Weight: 47.991 kg (105 lb 12.8 oz) 47.991 kg (105 lb 12.8 oz) 47.799 kg (105 lb 6 oz)     Exam:  General exam: Pleasant young female lying comfortably in bed. Respiratory system: Clear. No increased work of breathing. Cardiovascular system: S1 & S2 heard, RRR. No JVD, murmurs, gallops, clicks or pedal edema. Telemetry: Sinus bradycardia in the 40s-50s & SR. Gastrointestinal system: Abdomen is nondistended, soft and nontender. Normal bowel sounds heard. Central nervous system: Alert and oriented. No cranial nerve deficits. Extremities: 5 x 5 power in right limbs. Left upper extremity: Proximally grade  2 x 5 power and distally in hand grip and finger movement grade 4 x 5 power. Left hemisensory deficit- better.   Data Reviewed: Basic Metabolic Panel:  Recent Labs Lab 07/16/14 2024 07/16/14 2032 07/17/14 0355  NA 138 138 142  K 3.3* 3.0* 4.1    CL 101 102 108  CO2 22  --  22  GLUCOSE 145* 147* 117*  BUN 9 7 8   CREATININE 1.13* 1.30* 0.89  CALCIUM 9.2  --  8.5   Liver Function Tests:  Recent Labs Lab 07/16/14 2024 07/17/14 0355  AST 20 16  ALT 11 9  ALKPHOS 46 37*  BILITOT 0.5 0.4  PROT 7.7 6.3  ALBUMIN 3.8 3.2*   No results found for this basename: LIPASE, AMYLASE,  in the last 168 hours No results found for this basename: AMMONIA,  in the last 168 hours CBC:  Recent Labs Lab 07/16/14 2024 07/16/14 2032 07/17/14 0355 07/18/14 0701  WBC 6.6  --  6.1 5.0  NEUTROABS 2.7  --  3.5  --   HGB 13.0 13.6 11.6* 11.2*  HCT 37.3 40.0 33.7* 32.6*  MCV 93.5  --  94.1 93.9  PLT 176  --  153 147*   Cardiac Enzymes:  Recent Labs Lab 07/16/14 2024  CKTOTAL 103  CKMB 1.4  TROPONINI <0.30   BNP (last 3 results) No results found for this basename: PROBNP,  in the last 8760 hours CBG:  Recent Labs Lab 07/16/14 2128  GLUCAP 94    Recent Results (from the past 240 hour(s))  CULTURE, BLOOD (ROUTINE X 2)     Status: None   Collection Time    07/17/14 10:25 AM      Result Value Ref Range Status   Specimen Description BLOOD LEFT HAND   Final   Special Requests BOTTLES DRAWN AEROBIC AND ANAEROBIC 10CC   Final   Culture  Setup Time     Final   Value: 07/17/2014 14:13     Performed at [facility]   Culture     Final   Value:        BLOOD CULTURE RECEIVED NO GROWTH TO DATE CULTURE WILL BE HELD FOR 5 DAYS BEFORE ISSUING A FINAL NEGATIVE REPORT     Performed at [facility]   Report Status PENDING   Incomplete  CULTURE, BLOOD (ROUTINE X 2)     Status: None   Collection Time    07/17/14 10:35 AM      Result Value Ref Range Status   Specimen Description BLOOD RIGHT HAND   Final   Special Requests BOTTLES DRAWN AEROBIC AND ANAEROBIC 10CC   Final   Culture  Setup Time     Final   Value: 07/17/2014 14:13     Performed at [facility]   Culture     Final   Value:        BLOOD CULTURE  RECEIVED NO GROWTH TO DATE CULTURE WILL BE HELD FOR 5 DAYS BEFORE ISSUING A FINAL NEGATIVE REPORT     Performed at [facility]   Report Status PENDING   Incomplete      Additional labs: 1. Lipid panel: Cholesterol 104, triglycerides 54, HDL 53, LDL 14 VLDL 11 2. Hemoglobin A1c: 5.5 3. TSH: 1.060 4. Urine pregnancy test: Negative 5. UDS: Negative. Blood alcohol level <11 6. 2-D echo 07/17/14: Study Conclusions  - Left ventricle: The cavity size was normal. Systolic function was normal. The estimated ejection fraction  was in the range of 55% to 60%. Wall motion was normal; there were no regional wall motion abnormalities. - Atrial septum: No defect or patent foramen ovale was identified. 7. Homocysteine: 11.7, ESR 11, antithrombin 3:85, C3 complement 88, C4 complement 18  8. RPR nonreactive, HIV antibody nonreactive         Studies: Ct Angio Head W/cm &/or Wo Cm  07/16/2014   CLINICAL DATA:  Stroke.  EXAM: CT ANGIOGRAPHY HEAD AND NECK  TECHNIQUE: Multidetector CT imaging of the head and neck was performed using the standard protocol during bolus administration of intravenous contrast. Multiplanar CT image reconstructions and MIPs were obtained to evaluate the vascular anatomy. Carotid stenosis measurements (when applicable) are obtained utilizing NASCET criteria, using the distal internal carotid diameter as the denominator.  CONTRAST:  28m OMNIPAQUE IOHEXOL 300 MG/ML  SOLN  COMPARISON:  MRI of the head July 16, 2014  FINDINGS: CTA HEAD FINDINGS  Anterior circulation: Normal appearance of the cervical internal carotid arteries, petrous, cavernous and supra clinoid internal carotid arteries. Widely patent anterior communicating artery. Focal occlusion of right M2/3 insular branch, with reconstitution and subsequent high-grade stenosis versus occlusion. In addition, relative asymmetric decreased number of distal right anterior cerebral artery branches, axial 8/59.  Posterior  circulation: Left vertebral artery is dominant, with normal appearance of the vertebral arteries, vertebrobasilar junction and basilar artery, as well as main branch vessels. Small bilateral posterior communicating arteries are present. Normal appearance of the posterior cerebral arteries.  No large vessel occlusion, dissection, contrast extravasation or aneurysm within the anterior nor posterior circulation.  Right parietal transcortical encephalomalacia. No intraparenchymal hemorrhage, mass effect or midline shift. Right choroidal fissure cyst.  Review of the MIP images confirms the above findings.  CTA NECK FINDINGS  Normal appearance of the thoracic arch, two-vessel arch is a normal variant. The origins of the innominate, left Common carotid artery and subclavian artery are widely patent.  Bilateral Common carotid arteries are widely patent, coursing in a straight line fashion. Normal appearance of the carotid bifurcations without hemodynamically significant stenosis by NASCET criteria. Normal appearance of the included internal carotid arteries.  Left vertebral artery is dominant. Normal appearance of the vertebral arteries, which appear widely patent.  No hemodynamically significant stenosis by NASCET criteria. No dissection, no pseudoaneurysm. No abnormal luminal irregularity. No contrast extravasation.  Soft tissues are unremarkable. No acute osseous process though bone windows have not been submitted.  Review of the MIP images confirms the above findings.  IMPRESSION: CTA head: Intermittent occlusion of right middle cerebral artery insular branch, which could reflect thromboembolic disease or possible vasculopathy. In addition, relative asymmetric, decreased distal right anterior cerebral artery vessels, equivocal for clinically significant finding, could reflect normal variant. Findings may be better characterized on MRI of the head with and without contrast including gradient sequences.  Right parietal  transcortical encephalomalacia may reflect remote infarct or possibly head trauma.  CTA neck:  Normal CT angiogram of the neck.  Preliminary findings discussed with and reconfirmed by Dr.MCNEILL KPike [facility] Hospitalon7/22/2015at2230 hr.   Electronically Signed   By: CElon Alas  On: 07/16/2014 23:45   Dg Chest 2 View  07/17/2014   CLINICAL DATA:  Unable to lift arm secondary to stroke.  EXAM: CHEST  2 VIEW  COMPARISON:  None  FINDINGS: The heart size and mediastinal contours are within normal limits. Both lungs are clear. The visualized skeletal structures are unremarkable.  IMPRESSION: No active cardiopulmonary disease.   Electronically Signed   By:  Kathreen Devoid   On: 07/17/2014 13:31   Ct Head Wo Contrast  07/16/2014   CLINICAL DATA:  Left-sided weakness. LEFT arm weakness. Code stroke.  EXAM: CT HEAD WITHOUT CONTRAST  TECHNIQUE: Contiguous axial images were obtained from the base of the skull through the vertex without intravenous contrast.  COMPARISON:  None.  FINDINGS: No mass lesion, mass effect, midline shift, hydrocephalus, hemorrhage. No territorial ischemia or acute infarction. RIGHT choroidal fissure cyst incidentally noted. Paranasal sinuses appear within normal limits. Calvarium intact.  IMPRESSION: Negative CT head. Critical Value/emergent results were called by telephone at the time of interpretation on 07/16/2014 at 8:39 pm to Dr. Leonel Ramsay , who verbally acknowledged these results.   Electronically Signed   By: Dereck Ligas M.D.   On: 07/16/2014 20:40   Ct Angio Neck W/cm &/or Wo/cm  07/16/2014   CLINICAL DATA:  Stroke.  EXAM: CT ANGIOGRAPHY HEAD AND NECK  TECHNIQUE: Multidetector CT imaging of the head and neck was performed using the standard protocol during bolus administration of intravenous contrast. Multiplanar CT image reconstructions and MIPs were obtained to evaluate the vascular anatomy. Carotid stenosis measurements (when applicable) are obtained utilizing NASCET criteria,  using the distal internal carotid diameter as the denominator.  CONTRAST:  27m OMNIPAQUE IOHEXOL 300 MG/ML  SOLN  COMPARISON:  MRI of the head July 16, 2014  FINDINGS: CTA HEAD FINDINGS  Anterior circulation: Normal appearance of the cervical internal carotid arteries, petrous, cavernous and supra clinoid internal carotid arteries. Widely patent anterior communicating artery. Focal occlusion of right M2/3 insular branch, with reconstitution and subsequent high-grade stenosis versus occlusion. In addition, relative asymmetric decreased number of distal right anterior cerebral artery branches, axial 8/59.  Posterior circulation: Left vertebral artery is dominant, with normal appearance of the vertebral arteries, vertebrobasilar junction and basilar artery, as well as main branch vessels. Small bilateral posterior communicating arteries are present. Normal appearance of the posterior cerebral arteries.  No large vessel occlusion, dissection, contrast extravasation or aneurysm within the anterior nor posterior circulation.  Right parietal transcortical encephalomalacia. No intraparenchymal hemorrhage, mass effect or midline shift. Right choroidal fissure cyst.  Review of the MIP images confirms the above findings.  CTA NECK FINDINGS  Normal appearance of the thoracic arch, two-vessel arch is a normal variant. The origins of the innominate, left Common carotid artery and subclavian artery are widely patent.  Bilateral Common carotid arteries are widely patent, coursing in a straight line fashion. Normal appearance of the carotid bifurcations without hemodynamically significant stenosis by NASCET criteria. Normal appearance of the included internal carotid arteries.  Left vertebral artery is dominant. Normal appearance of the vertebral arteries, which appear widely patent.  No hemodynamically significant stenosis by NASCET criteria. No dissection, no pseudoaneurysm. No abnormal luminal irregularity. No contrast  extravasation.  Soft tissues are unremarkable. No acute osseous process though bone windows have not been submitted.  Review of the MIP images confirms the above findings.  IMPRESSION: CTA head: Intermittent occlusion of right middle cerebral artery insular branch, which could reflect thromboembolic disease or possible vasculopathy. In addition, relative asymmetric, decreased distal right anterior cerebral artery vessels, equivocal for clinically significant finding, could reflect normal variant. Findings may be better characterized on MRI of the head with and without contrast including gradient sequences.  Right parietal transcortical encephalomalacia may reflect remote infarct or possibly head trauma.  CTA neck:  Normal CT angiogram of the neck.  Preliminary findings discussed with and reconfirmed by Dr.MCNEILL KNorwood [facility] LLCon7/22/2015at2230 hr.   Electronically  Signed   By: Elon Alas   On: 07/16/2014 23:45   Mr Brain Wo Contrast  07/16/2014   CLINICAL DATA:  Left-sided weakness, numbness and new onset confusion.  EXAM: MRI HEAD WITHOUT CONTRAST  TECHNIQUE: Multiplanar, multiecho pulse sequences of the brain and surrounding structures were obtained without intravenous contrast.  COMPARISON:  Head CT same day  FINDINGS: Diffusion imaging in the axial coronal planes shows multiple punctate foci of restricted diffusion within the right middle cerebral artery territory affecting the posterior temporal and parietal regions. The largest insult is only about 3 mm in size. No sign of swelling or hemorrhage. No lesion seen in left hemisphere or in the cerebellum or affecting the brainstem.  No evidence of hydrocephalus. No evidence of mass lesion or extra-axial collection.  IMPRESSION: Numerous punctate acute infarctions in the right posterior temporal and parietal region consistent with micro embolic infarctions in the right middle cerebral artery territory.   Electronically Signed   By: Nelson Chimes M.D.   On:  07/16/2014 21:25   Mr Jeri Cos VQ Contrast  07/17/2014   CLINICAL DATA:  28 year old female presented with left upper extremity weakness and left facial droop found to have small acute right hemisphere infarcts on MRI, subsequently right MCA M2 disease on CTA. Headache upon presentation. Progression of left side weakness arm greater than leg. Initial encounter.  EXAM: MRI HEAD WITHOUT AND WITH CONTRAST  TECHNIQUE: Multiplanar, multiecho pulse sequences of the brain and surrounding structures were obtained without and with intravenous contrast.  CONTRAST:  10 mL MultiHance.  COMPARISON:  Head and neck CTA 07/16/2014.  Brain MRI 07/16/2014.  FINDINGS: Progression of right MCA restricted diffusion, now with confluent involvement in the operculum, right parietal lobe, and scattered cortical diffusion restriction in the superior peri-Rolandic and pre motor areas.  There is new confluent restricted diffusion in the left anterior superior frontal gyrus, pre motor left ACA/ MCA watershed territory. There are also scattered small right occipital lobe and possibly also left posterior temporal lobe lacunar type areas of restricted diffusion (series 3, images 11 and 12).  No deep gray matter nuclei, or posterior fossa restricted diffusion. Major intracranial vascular flow voids are preserved, although there is abnormal FLAIR signal in the dominant right MCA posterior sylvian division, corresponding to the CTA findings (series 7, images 10-14).  Associated mild T2 and FLAIR hyperintensity in the affected areas. No mass effect. No definite hemorrhagic transformation ; asymmetry on susceptibility weighted imaging is felt primarily due to vascular thrombus or asymmetric slow vessel flow. No asymmetry of the major intracranial venous structures identified.  No abnormal enhancement identified.  Small right mesial temporal lobe hippocampal fissure all cyst or arachnoid cyst re- identified. No significant intracranial mass lesion. No  ventriculomegaly. No midline shift. Patent basilar cisterns. Negative pituitary, cervicomedullary junction and visualized cervical spine. Visible internal auditory structures appear normal. Mastoids are clear. Minor paranasal sinus mucosal thickening. Visualized orbit soft tissues are within normal limits. Visualized scalp soft tissues are within normal limits.  IMPRESSION: 1. Interval progression of right MCA infarcts. Right operculum and parietal lobe confluent [place] affected now. Small cortical infarcts in the peri-Rolandic region. 2. New left superior frontal gyrus, left ACA - left MCA watershed type, infarct. Small right occipital lacunar infarcts, possibly also in the posterior left temporal lobe. 3. The constellation of recent CTA and MRI data suggests a proximal aortic or cardiac source of emboli. 4. No mass effect or hemorrhage.  No abnormal enhancement. 5. Major vascular  flow voids appear stable to the recent CTA, including evidence of abnormal flow in the right MCA dominant posterior sylvian branch. Study discussed by telephone with Dr. Rosalin Hawking on 07/17/2014 at 13:52 .   Electronically Signed   By: Lars Pinks M.D.   On: 07/17/2014 13:55        Scheduled Meds: . aspirin  300 mg Rectal Daily   Or  . aspirin  325 mg Oral Daily  . enoxaparin (LOVENOX) injection  40 mg Subcutaneous Q24H  . loratadine  10 mg Oral Daily  . nicotine  21 mg Transdermal Daily   Continuous Infusions:    Principal Problem:   CVA (cerebral infarction) Active Problems:   Hypokalemia   Anemia, unspecified   Migraine    Time spent: 40 minutes.    Vernell Leep, MD, FACP, FHM. [facility] Pager [phone]  If 7PM-7AM, please contact night-coverage [email or web address] Password [email or web address] 07/18/2014, 12:09 PM    LOS: 2 days

## 2014-07-18 NOTE — Progress Notes (Signed)
Occupational Therapy Evaluation Patient Details Name: Cristina [name] MRN: [id number] DOB: 1986/05/06 Today's Date: 07/18/2014    History of Present Illness 28 yo admitted with CVa symptoms. MRI - [place] infarcts in R MCA territory with involvement in the operculum, parietal lobe, and pre motor areas, in addition to small area in R occipital lobe. Also with restricted diffusion in the L ACA/MCA watershed territory and possible L posterior temporal lobe.   Clinical Impression   PTA, pt was independent with ADL and mobility. 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 Pt presents with LUE weakness, L UE sensory deficits, L inattention, possible L inferior visual field deficit, visuospatial deficits, and difficulty with delayed recall and verbal fluency, decreasing her independence and safety with ADL and functional mobility. At this time, recommend pt follow up with the neuro outpt center for OT. Also recommend pt have a full field visual field test completed by her eye doctor to further assess visual fields, given pt's desire to return to driving eventually and be independent . Will follow acutely to maximize functional level of independence.     Follow Up Recommendations  Neuro -Outpatient OT;Supervision/Assistance - 24 hour (neuro outp. initial 24/7 S)  Neuro psych eval on outpt basis   Equipment Recommendations  None recommended by OT    Recommendations for Other Services       Precautions / Restrictions Precautions Precautions: Fall Precaution Comments: left UE hemiparesis      Mobility Bed Mobility Overal bed mobility: Needs Assistance Bed Mobility: Supine to Sit     Supine to sit: Supervision     General bed mobility comments: Required vc to roll to side and push through strong RUE to sit up. Pt kept falling over and was unable to problem solve alternative way of getting to EOB  Transfers Overall transfer level: Needs assistance   Transfers: Sit to/from  Stand;Stand Pivot Transfers Sit to Stand: Min guard Stand pivot transfers: Min guard       General transfer comment: LOB in bathroom postiorly, requiring miguard    Balance Overall balance assessment: Needs assistance Sitting-balance support: Feet supported Sitting balance-Leahy Scale: Good     Standing balance support: During functional activity Standing balance-Leahy Scale: Fair                              ADL Overall ADL's : Needs assistance/impaired Eating/Feeding: Set up   Grooming: Minimal assistance;Standing (with opening containers)   Upper Body Bathing: Minimal assitance;Sitting   Lower Body Bathing: Minimal assistance;Sit to/from stand   Upper Body Dressing : Moderate assistance;Sitting   Lower Body Dressing: Moderate assistance;Sit to/from stand   Toilet Transfer: Minimal assistance;Ambulation;Comfort height toilet Toilet Transfer Details (indicate cue type and reason): Pt running into sink on her way to the toilet and unaware of doing so Toileting- Clothing Manipulation and Hygiene: Moderate assistance;Sit to/from stand Toileting - Clothing Manipulation Details (indicate cue type and reason): Assistance with pad in panties and holding gown     Functional mobility during ADLs: Min guard (1 LOB posteriorly aftertoileting) General ADL Comments: note L inattention and apparent cognitive deficits during ADL. ? L visual field deficit     Vision Eye Alignment: Within Functional Limits Alignment/Gaze Preference: Head tilt;Other (comment) (R bias) Ocular Range of Motion: Within Functional Limits Tracking/Visual Pursuits: Decreased smoothness of horizontal tracking Saccades: Decreased speed of saccadic movement Convergence: Within functional limits  Perception Perception Perception Tested?: Yes Perception Deficits: Inattention/neglect;Spatial orientation Inattention/Neglect: Impaired- to be further tested in functional context (L  inattention; unaware of position of L arm. unawre of L arm) Spatial deficits: poor positioning at sink during grooing tasks. difficulty with cancellation tasks. omitting letters   Praxis Praxis Praxis tested?: Within functional limits    Pertinent Vitals/Pain No c/o pain     Hand Dominance Right   Extremity/Trunk Assessment Upper Extremity Assessment Upper Extremity Assessment: RUE deficits/detail RUE Deficits / Details: Brunstrom level II hand (lminimal finger movement) and level IV arm (movement deviating from synergy)PT with minimal active finger movemtn - primarily index finger. Unable to make full fist. unable to extend any digits withthe exceptioin of index finger. beginning to demonstrate active supination/provnaiton. Able to extend elbow against gravity. @ 20 degrees active shoulder flexion. Min increased flexor tone RUE Sensation: decreased proprioception;decreased light touch RUE Coordination: decreased fine motor;decreased gross motor (requires vc for pt to attemptto use as gross assist)   Lower Extremity Assessment Lower Extremity Assessment: Defer to PT evaluation LLE Deficits / Details: Pt reports mild decreased sensation of left thigh vs right but able to sense light touch and 4/5 strength hamstring but all other myotomes 5/5   Cervical / Trunk Assessment Cervical / Trunk Assessment: Normal   Communication Communication Communication: No difficulties   Cognition Arousal/Alertness: Awake/alert Behavior During Therapy: WFL for tasks assessed/performed Overall Cognitive Status: Impaired/Different from baseline Area of Impairment: Attention;Memory;Awareness;Safety/judgement;Problem solving   Current Attention Level: Alternating Memory: Decreased short-term memory   Safety/Judgement: Decreased awareness of safety;Decreased awareness of deficits Awareness: Emergent Problem Solving: Slow processing General Comments: Administered MOCA to further assess cognition. Pt  scored 24/30, demonstrating difficultu with visuopatial/executive components, fluency of language and delayed recall.   General Comments       Exercises Exercises: Other exercises Other Exercises Other Exercises: encouraged pt to try to use LUE as "gross assist" during tasks Other Exercises: Discussed using purse over L shoulder with L hand positioned on purse to increase attention and awareness of LUE. Other Exercises: Discussed L inattention and using visual feedback to increase awreness of LUE   Shoulder Instructions      Home Living Family/patient expects to be discharged to:: Private residence Living Arrangements: Spouse/significant other;Parent Available Help at Discharge: Family;Friend(s);Available 24 hours/day Type of Home: House Home Access: Ramped entrance     Home Layout: One level     Bathroom Shower/Tub: Tub/shower unit Shower/tub characteristics: Engineer, building servicesCurtain Bathroom Toilet: Standard Bathroom Accessibility: Yes How Accessible: Accessible via walker Home Equipment: None   Additional Comments: Pt lives with mom and 2yo son      Prior Functioning/Environment Level of Independence: Independent        Comments: Pt is a Leisure centre managerbartender at [employer]    OT Diagnosis: Generalized weakness;Cognitive deficits;Disturbance of vision;Hemiplegia non-dominant side   OT Problem List: Decreased strength;Decreased range of motion;Decreased activity tolerance;Impaired balance (sitting and/or standing);Impaired vision/perception;Decreased coordination;Decreased cognition;Decreased safety awareness;Impaired sensation;Impaired UE functional use;Impaired tone   OT Treatment/Interventions: Self-care/ADL training;Therapeutic exercise;Neuromuscular education;DME and/or AE instruction;Therapeutic activities;Cognitive remediation/compensation;Visual/perceptual remediation/compensation;Patient/family education;Balance training    OT Goals(Current goals can be found in the care plan section) Acute  Rehab OT Goals Patient Stated Goal: to use my hand agian OT Goal Formulation: With patient Time For Goal Achievement: 08/01/14 Potential to Achieve Goals: Good  OT Frequency: Min 3X/week   Barriers to D/C:            Co-evaluation  End of Session Equipment Utilized During Treatment: Gait belt Nurse Communication: Mobility status;Other (comment) (need for neuro outpt OT)  Activity Tolerance: Patient tolerated treatment well Patient left: in bed;with call bell/phone within reach;with family/visitor present   Time: 4098-1191 OT Time Calculation (min): 51 min Charges:  OT General Charges $OT Visit: 1 Procedure OT Evaluation $Initial OT Evaluation Tier I: 1 Procedure OT Treatments $Self Care/Home Management : 23-37 mins $Therapeutic Activity: 8-22 mins G-Codes:    Faria Casella,HILLARY 22-Jul-2014, 11:59 AM   Luisa Dago, OTR/L  [phone] 2014/07/22

## 2014-07-18 NOTE — Progress Notes (Signed)
*  PRELIMINARY RESULTS* Echocardiogram Echocardiogram Transesophageal has been performed.  Jeryl ColumbiaLLIOTT, Kc Summerson 07/18/2014, 3:12 PM

## 2014-07-19 DIAGNOSIS — I059 Rheumatic mitral valve disease, unspecified: Secondary | ICD-10-CM

## 2014-07-19 DIAGNOSIS — D649 Anemia, unspecified: Secondary | ICD-10-CM

## 2014-07-19 DIAGNOSIS — E876 Hypokalemia: Secondary | ICD-10-CM

## 2014-07-19 DIAGNOSIS — I6350 Cerebral infarction due to unspecified occlusion or stenosis of unspecified cerebral artery: Secondary | ICD-10-CM

## 2014-07-19 LAB — VITAMIN B12: Vitamin B-12: 313 pg/mL (ref 211–911)

## 2014-07-19 LAB — HEPATITIS PANEL, ACUTE
HCV AB: NEGATIVE
HEP A IGM: NONREACTIVE
HEP B C IGM: NONREACTIVE
Hepatitis B Surface Ag: NEGATIVE

## 2014-07-19 LAB — RHEUMATOID FACTOR: Rhuematoid fact SerPl-aCnc: <10 IU/mL (ref <=14)

## 2014-07-19 LAB — FOLATE: FOLATE: 14.6 ng/mL

## 2014-07-19 LAB — COMPLEMENT, TOTAL: Compl, Total (CH50): 52 U/mL (ref 31–60)

## 2014-07-19 MED ORDER — VITAMIN B-12 1000 MCG PO TABS
1000.0000 ug | ORAL_TABLET | Freq: Every day | ORAL | Status: DC
  Administered 2014-07-19: 1000 ug via ORAL
  Filled 2014-07-19: qty 1

## 2014-07-19 MED ORDER — ASPIRIN EC 325 MG PO TBEC: 325.0000 mg | DELAYED_RELEASE_TABLET | Freq: Every day | ORAL | Status: DC

## 2014-07-19 MED ORDER — NICOTINE 21 MG/24HR TD PT24: 21.0000 mg | MEDICATED_PATCH | Freq: Every day | TRANSDERMAL | Status: DC

## 2014-07-19 MED ORDER — ATORVASTATIN CALCIUM 10 MG PO TABS: 10.0000 mg | ORAL_TABLET | Freq: Every day | ORAL | Status: DC

## 2014-07-19 NOTE — Evaluation (Signed)
Speech Language Pathology Evaluation Patient Details Name: Cristina [name] MRN: [id number] DOB: Jan 16, 1986 Today's Date: 07/19/2014 Time: 4098-1191 SLP Time Calculation (min): 44 min  Problem List:  Patient Active Problem List   Diagnosis Date Noted  . Hypokalemia 07/17/2014  . Anemia, unspecified 07/17/2014  . Migraine 07/17/2014  . CVA (cerebral infarction) 07/16/2014   Past Medical History:  Past Medical History  Diagnosis Date  . Stroke 06/2014   Past Surgical History:  Past Surgical History  Procedure Laterality Date  . Right ear surgery     HPI:  28 year old white female with history of migraine, presented to the ED on 07/16/14 with acute onset of left-sided weakness & numbness-upper extremity >lower extremity, transient facial droop/slurred speech/swallowing difficulty, without headache which started at approximately 745 PM on 7/22. Patient was not administered TPA secondary to improvement in symptoms. CT head was negative for acute infarct. MRI head showed multiple infarcts in the right MCA territory.    Assessment / Plan / Recommendation Clinical Impression  Pt presents with moderate memory impairment evidenced by scoring on memory subset - score 6/12.  Her speech was fluent without deficits.  Today pt was able to draw full clock adequately and copy cylinder without deficits.  Word finding adequate - 18 animals in 60 seconds.  Pt wrote paragraph with intact semantics and syntax.  SLP educated pt to findings of testing and discussed importance of attention skills when managing her 28 year old son.  Provided tasks to improve divided attention and to memory compensation strategies.  Pt's mother is at home with her and pt reports she can help to monitor pt's son.  Pt for follow up outpt OT per chart review.  No furhter SLP indicated at this time as pt educated to compensations.  Pt agreeable to plan.     SLP Assessment       Follow Up Recommendations  None    Frequency and  Duration        Pertinent Vitals/Pain Afebrile, decreased   SLP Goals     SLP Evaluation Prior Functioning  Cognitive/Linguistic Baseline: Within functional limits Type of Home: House  Lives With: Family Available Help at Discharge: Family;Friend(s) Education: HS education, resides with mother  - mother has COPD   Cognition  Arousal/Alertness: Awake/alert Orientation Level: Oriented X4 Attention: Selective Selective Attention: Appears intact Memory: Impaired Memory Impairment: Storage deficit;Retrieval deficit (score revealed moderate memory impairment on Cognistat) Awareness: Appears intact Problem Solving: Impaired Problem Solving Impairment: Verbal complex (verbal complex- higher level math, may be close to baseline per pt) Safety/Judgment: Appears intact    Comprehension  Auditory Comprehension Overall Auditory Comprehension: Appears within functional limits for tasks assessed Yes/No Questions: Not tested Commands: Not tested Conversation: Complex Visual Recognition/Discrimination Discrimination: Not tested Reading Comprehension Reading Status: Within funtional limits    Expression Expression Primary Mode of Expression: Verbal Verbal Expression Overall Verbal Expression: Appears within functional limits for tasks assessed Initiation: No impairment Repetition: No impairment Naming: Not tested Pragmatics: No impairment Other Verbal Expression Comments: pt able to name 18 animals in 60 seconds Written Expression Dominant Hand: Right Written Expression: Within Functional Limits   Oral / Motor Oral Motor/Sensory Function Overall Oral Motor/Sensory Function: Impaired Labial ROM: Within Functional Limits Labial Symmetry: Within Functional Limits Labial Strength: Reduced (slightly decreased) Labial Sensation: Within Functional Limits Lingual ROM: Within Functional Limits Lingual Symmetry: Within Functional Limits Lingual Strength: Within Functional  Limits Lingual Sensation: Within Functional Limits Facial ROM: Reduced left (slightly decreased) Facial Symmetry: Left  droop Facial Strength: Reduced (lower slight) Facial Sensation: Reduced (upper and lower) Motor Speech Overall Motor Speech: Appears within functional limits for tasks assessed Respiration: Within functional limits Phonation: Normal Resonance: Within functional limits Articulation: Within functional limitis Intelligibility: Intelligible Motor Planning: Witnin functional limits Motor Speech Errors: Not applicable   GO     Cristina [name], Cristina [name] Cristina Avika Carbine, MS [facility] SLP [phone]

## 2014-07-19 NOTE — Progress Notes (Signed)
Occupational Therapy Treatment Patient Details Name: Cristina [name] MRN: [id number] DOB: Aug 18, 1986 Today's Date: 07/19/2014    History of present illness 28 yo admitted with CVAsymptoms. MRI - watershed infarcts in R MCA territory with involvement inthe operculum, parietal lobe, and pre motor areas, in addition to small area in R occipital lobe. Also with restricted diffusion in the L ACA/MCA watershed territory and possible L posterior temporal lobe.   OT comments  Pt demonstrating improvement in attention to L visual field during functional activity and able to perform finger flexion and extension with some wrist tenodesis, supination/pronation, and FF to 100 degrees. Instructed pt in therapy putty activities and level 1 theraband exercises for L UE. Instructed pt to purposely incorporate use of L UE as much as possible during ADL (holding cup with 2 hands, stabilizing container while manipulating).  Will continue to follow.  Follow Up Recommendations  Neuro Outpatient OT;Supervision/Assistance - 24 hour    Equipment Recommendations  None recommended by OT    Recommendations for Other Services      Precautions / Restrictions Precautions Precautions: Fall Precaution Comments: left UE hemiparesis       Mobility Bed Mobility   Bed Mobility: Supine to Sit;Sit to Supine     Supine to sit: Supervision Sit to supine: Supervision   General bed mobility comments: extra time and effort, HOB up  Transfers Overall transfer level: Needs assistance   Transfers: Sit to/from Stand Sit to Stand: Min guard              Balance                                   ADL       Grooming: Minimal assistance;Wash/dry hands;Wash/dry face;Standing (assist to [employer])                   Toilet Transfer: Min guard;Comfort height toilet;Ambulation;Grab bars Toilet Transfer Details (indicate cue type and reason): decreased attention to items on L as she  ambulated around room. Toileting- Clothing Manipulation and Hygiene: Moderate assistance;Sit to/from stand Toileting - Clothing Manipulation Details (indicate cue type and reason): assisted to change pad and pull up panties     Functional mobility during ADLs: Min guard (primarily for safety with L inattention)        Vision                 Additional Comments: placed ADL items on L side of sink for pt to locate, verbal cues required   Perception     Praxis      Cognition   Behavior During Therapy: [facility] for tasks assessed/performed Overall Cognitive Status: Impaired/Different from baseline Area of Impairment: Attention;Memory;Awareness;Safety/judgement   Current Attention Level: Alternating Memory: Decreased short-term memory    Safety/Judgement: Decreased awareness of safety;Decreased awareness of deficits Awareness: Emergent        Extremity/Trunk Assessment               Exercises Other Exercises Other Exercises: instructed pt to use L UE as much as possible when opening containers, manipulating theraputty container.  Issued and instructed in use of soft (yellow) therapy putty.  Also set up and performed elbow extension and FF with Level 1 theraband.  Instructed pt in how to grade tband exercises.   Shoulder Instructions       General Comments      Pertinent Vitals/ Pain  No pain  Home Living     Available Help at Discharge: Family;Friend(s) Type of Home: House                              Lives With: Family    Prior Functioning/Environment              Frequency Min 3X/week     Progress Toward Goals  OT Goals(current goals can now be found in the care plan section)  Progress towards OT goals: Progressing toward goals  Acute Rehab OT Goals Patient Stated Goal: to use my hand agian  Plan Discharge plan remains appropriate    Co-evaluation                 End of Session Equipment Utilized During  Treatment: Gait belt   Activity Tolerance Patient tolerated treatment well   Patient Left in bed;with call bell/phone within reach (MD in room)   Nurse Communication          Time: 4540-98111330-1402 OT Time Calculation (min): 32 min  Charges: OT General Charges $OT Visit: 1 Procedure OT Treatments $Neuromuscular Re-education: 23-37 mins  Evern BioMayberry, Shailynn Fong Lynn 07/19/2014, 2:24 PM [phone]

## 2014-07-19 NOTE — Consult Note (Signed)
[facility]  Date of Admission:  07/16/2014  Date of Consult:  07/19/2014  Reason for Consult: Endocarditis, CVA Referring Physician: Hongalgi  Impression/Recommendation MV Endocarditis CVA  Repeat BCx Hold on vanco/ceftriaxone for now Fungal Ab panel Bartonella Ab panel Check cryoglobulins Check Hep A/B/C consider rheum eval?  Comment- Perplexing case which does not seem to show any overt signs of infection. Will hold on starting her on anbx at this point.  If d/c, please have her seen in ID clinic within 1 week.   Comment Thank you so much for this interesting consult,   Bobby Rumpf (pager) [phone] [email or web address]  Cristina [name] is an 28 y.o. female.  HPI: 28 yo F with hx of migraine, admission on 7-23 with L arm weakness, facial droop, and dysarthria. She was found on MRI to have scattered R MCA infarcts. On f/u MRI these were noted to have progressed as well as new L ACA-MCA infarcts. She underwent TEE on 7-24 showing vegetation on mitral valve.   Tmax in hospital 99.4. WBC normal HIV (-), RPR (-), BCx ngtd. UDS (-).    Past Medical History  Diagnosis Date  . Stroke 06/2014    Past Surgical History  Procedure Laterality Date  . Right ear surgery       Not on File  Medications:  Scheduled: . aspirin  325 mg Oral Daily  . atorvastatin  10 mg Oral q1800  . enoxaparin (LOVENOX) injection  40 mg Subcutaneous Q24H  . loratadine  10 mg Oral Daily  . nicotine  21 mg Transdermal Daily  . vitamin B-12  1,000 mcg Oral Daily    Abtx:  Anti-infectives   None      Total days of antibiotics: 0          Social History:  reports that she has been smoking Cigarettes.  She has a 15 pack-year smoking history. She has never used smokeless tobacco. She reports that she drinks alcohol. She reports that she does not use illicit drugs.  Family History  Problem Relation Age of Onset  . CAD Mother   . Stroke Other      General ROS: She denies any hx of truama, recent dental work or injury. No hx of f/c.   Blood pressure 121/74, pulse 76, temperature 98 F (36.7 C), temperature source Oral, resp. rate 18, height 5' 2"  (1.575 m), weight 48 kg (105 lb 13.1 oz), last menstrual period 07/18/2014, SpO2 100.00%. General appearance: alert, cooperative and no distress Eyes: negative findings: conjunctivae and sclerae normal and pupils equal, round, reactive to light and accomodation Throat: lips, mucosa, and tongue normal; teeth and gums normal Neck: no adenopathy and supple, symmetrical, trachea midline Lungs: clear to auscultation bilaterally Heart: regular rate and rhythm Abdomen: normal findings: bowel sounds normal and soft, non-tender Extremities: edema none Skin: no lesions on fingers or toes. nails painted.  Neurologic: Motor: decreased strength/coordination LUE. grossly NL BLE.    Results for orders placed during the hospital encounter of 07/16/14 (from the past 48 hour(s))  ANA     Status: Abnormal   Collection Time    07/17/14  2:30 PM      Result Value Ref Range   ANA POSITIVE (*) NEGATIVE   Comment: Performed at [facility]     Status: Abnormal   Collection Time    07/17/14  2:30 PM      Result Value Ref Range   C3  Complement 88 (*) 90 - 180 mg/dL   Comment: Performed at [facility]     Status: None   Collection Time    07/17/14  2:30 PM      Result Value Ref Range   Complement C4, Body Fluid 18  10 - 40 mg/dL   Comment: Performed at [facility], TOTAL     Status: None   Collection Time    07/17/14  2:30 PM      Result Value Ref Range   Compl, Total (CH50) 52  31 - 60 U/mL   Comment: Performed at [facility] (ROUTINE TESTING)     Status: None   Collection Time    07/17/14  2:30 PM      Result Value Ref Range   HIV 1&2 Ab, 4th Generation NONREACTIVE  NONREACTIVE   Comment: (NOTE)     A  NONREACTIVE HIV Ag/Ab result does not exclude HIV infection since     the time frame for seroconversion is variable. If acute HIV infection     is suspected, a HIV-1 RNA Qualitative TMA test is recommended.     HIV-1/2 Antibody Diff         Not indicated.     HIV-1 RNA, Qual TMA           Not indicated.     PLEASE NOTE: This information has been disclosed to you from records     whose confidentiality may be protected by state law. If your state     requires such protection, then the state law prohibits you from making     any further disclosure of the information without the specific written     consent of the person to whom it pertains, or as otherwise permitted     by law. A general authorization for the release of medical or other     information is NOT sufficient for this purpose.     The performance of this assay has not been clinically validated in     patients less than 61 years old.     Performed at [facility]     Status: None   Collection Time    07/17/14  2:30 PM      Result Value Ref Range   AntiThromb III Func 85  75 - 120 %  PROTEIN C ACTIVITY     Status: None   Collection Time    07/17/14  2:30 PM      Result Value Ref Range   Protein C Activity 114  75 - 133 %   Comment: Performed at [facility], TOTAL     Status: None   Collection Time    07/17/14  2:30 PM      Result Value Ref Range   Protein C, Total 76  72 - 160 %   Comment: ** Please note change in reference range(s). **     Performed at [facility] ACTIVITY     Status: Abnormal   Collection Time    07/17/14  2:30 PM      Result Value Ref Range   Protein S Activity 61 (*) 69 - 129 %   Comment: Performed at [place], TOTAL     Status: Abnormal   Collection Time    07/17/14  2:30 PM  Result Value Ref Range   Protein S Ag, Total 58 (*) 60 - 150 %   Comment: Performed at [place] [facility]     Status: None   Collection Time    07/17/14  2:30 PM      Result Value Ref Range   PTT Lupus Anticoagulant 38.4  28.0 - 43.0 secs   PTTLA Confirmation NOT APPL  <8.0 secs   PTTLA 4:1 Mix NOT APPL  28.0 - 43.0 secs   Drvvt 31.4  <42.9 secs   Drvvt confirmation NOT APPL  <1.15 Ratio   dRVVT Incubated 1:1 Mix NOT APPL  <42.9 secs   Lupus Anticoagulant NOT DETECTED  NOT DETECTED   Comment: Performed at [facility]     Status: None   Collection Time    07/17/14  2:30 PM      Result Value Ref Range   Homocysteine 11.7  4.0 - 15.4 umol/L   Comment: Performed at [facility], IGG, IGM, IGA     Status: Abnormal   Collection Time    07/17/14  2:30 PM      Result Value Ref Range   Anticardiolipin IgG 6 (*) <23 GPL U/mL   Anticardiolipin IgM 3 (*) <11 MPL U/mL   Anticardiolipin IgA 2 (*) <22 APL U/mL   Comment: (NOTE)     Reference Range:  Cardiolipin IgG       Normal                  <23       Low Positive (+)        23-35       Moderate Positive (+)   36-50       High Positive (+)       >50     Reference Range:  Cardiolipin IgM       Normal                  <11       Low Positive (+)        11-20       Moderate Positive (+)   21-30       High Positive (+)       >30     Reference Range:  Cardiolipin IgA       Normal                  <22       Low Positive (+)        22-35       Moderate Positive (+)   36-45       High Positive (+)       >45     Performed at [facility]  RPR     Status: None   Collection Time    07/17/14  2:30 PM      Result Value Ref Range   RPR NON REAC  NON REAC   Comment: Performed at [facility]     Status: None   Collection Time    07/17/14  2:30 PM      Result Value Ref Range   Sed Rate 11  0 - 22 mm/hr  BETA-2-GLYCOPROTEIN I ABS, IGG/M/A     Status: None   Collection Time    07/17/14  2:30 PM      Result Value Ref Range  Beta-2 Glyco I IgG 13   <20 G Units   Beta-2-Glycoprotein I IgM 2  <20 M Units   Beta-2-Glycoprotein I IgA 9  <20 A Units   Comment: Performed at [facility]     Status: None   Collection Time    07/17/14  2:30 PM      Result Value Ref Range   c-ANCA Screen NEGATIVE  NEGATIVE   p-ANCA Screen NEGATIVE  NEGATIVE   Atypical p-ANCA Screen NEGATIVE  NEGATIVE   Comment: (NOTE)     ANCA Screen includes evaluation for p-ANCA, c-ANCA and Atypical     p-ANCA.     Performed at [facility] ANTIBODY     Status: None   Collection Time    07/17/14  2:30 PM      Result Value Ref Range   SSA (Ro) (ENA) Antibody, IgG <1.0 NEG  <1.0 NEG AI   Comment: Performed at [place]     Status: None   Collection Time    07/17/14  2:30 PM      Result Value Ref Range   SSB (La) (ENA) Antibody, IgG <1.0 NEG  <1.0 NEG AI   Comment: Performed at [facility] AB-TITER (ANA TITER)     Status: None   Collection Time    07/17/14  2:30 PM      Result Value Ref Range   ANA Titer 1 NEGATIVE  <1:40   Comment: (NOTE)     Reference Ranges:     1:40 - 1:80 Weakly positive, usually not clinically significant.     > or = to 1:160 Result may be clinically significant.                                                                               Due to differences in methodologies, results may differ between the     ANA screen and the Reflex IFA titer and pattern.                                                                             ANA Pattern 1 (NOTE)     Comment: Pattern not applicable due to a negative titer result.     Performed at [facility]  CBC     Status: Abnormal   Collection Time    07/18/14  7:01 AM      Result Value Ref Range   WBC 5.0  4.0 - 10.5 K/uL   RBC 3.47 (*) 3.87 - 5.11 MIL/uL   Hemoglobin 11.2 (*) 12.0 - 15.0 g/dL   HCT 32.6 (*) 36.0  - 46.0 %   MCV 93.9  78.0 - 100.0 fL   MCH 32.3  26.0 - 34.0 pg   MCHC 34.4  30.0 - 36.0  g/dL   RDW 12.4  11.5 - 15.5 %   Platelets 147 (*) 150 - 400 K/uL  RHEUMATOID FACTOR     Status: None   Collection Time    07/18/14  7:44 PM      Result Value Ref Range   Rheumatoid Factor <10  <=14 IU/mL   Comment: (NOTE)                             Interpretive Table                        Low Positive: 15 - 41 IU/mL                        High Positive:  >= 42 IU/mL     In addition to the RF result, and clinical symptoms including joint     involvement, the 2010 ACR Classification Criteria for     scoring/diagnosing Rheumatoid Arthritis include the results of the     following tests:  CRP (02585), ESR (15010), and CCP (APCA) (27782).     [email or web address].org/practice/clinical/classification/ra/ra_2010.asp     Performed at [facility]     Status: None   Collection Time    07/18/14  7:44 PM      Result Value Ref Range   Vitamin B-12 313  211 - 911 pg/mL   Comment: Performed at [facility]     Status: None   Collection Time    07/18/14  7:44 PM      Result Value Ref Range   Folate 14.6     Comment: (NOTE)     Reference Ranges            Deficient:       0.4 - 3.3 ng/mL            Indeterminate:   3.4 - 5.4 ng/mL            Normal:              > 5.4 ng/mL     Performed at [facility]      Component Value Date/Time   SDES BLOOD RIGHT HAND 07/17/2014 1035   [place] AND ANAEROBIC 10CC 07/17/2014 1035   CULT  Value:        BLOOD CULTURE RECEIVED NO GROWTH TO DATE CULTURE WILL BE HELD FOR 5 DAYS BEFORE ISSUING A FINAL NEGATIVE REPORT Performed at [facility] 07/17/2014 1035   REPTSTATUS PENDING 07/17/2014 1035   No results found. Recent Results (from the past 240 hour(s))  CULTURE, BLOOD (ROUTINE X 2)     Status: None   Collection Time    07/17/14 10:25 AM      Result Value Ref Range Status   Specimen  Description BLOOD LEFT HAND   Final   Special Requests BOTTLES DRAWN AEROBIC AND ANAEROBIC 10CC   Final   Culture  Setup Time     Final   Value: 07/17/2014 14:13     Performed at [facility]   Culture     Final   Value:        BLOOD CULTURE RECEIVED NO GROWTH TO DATE CULTURE WILL BE HELD FOR 5 DAYS BEFORE ISSUING A FINAL NEGATIVE REPORT     Performed at [facility]   Report Status PENDING  Incomplete  CULTURE, BLOOD (ROUTINE X 2)     Status: None   Collection Time    07/17/14 10:35 AM      Result Value Ref Range Status   Specimen Description BLOOD RIGHT HAND   Final   Special Requests BOTTLES DRAWN AEROBIC AND ANAEROBIC 10CC   Final   Culture  Setup Time     Final   Value: 07/17/2014 14:13     Performed at [facility]   Culture     Final   Value:        BLOOD CULTURE RECEIVED NO GROWTH TO DATE CULTURE WILL BE HELD FOR 5 DAYS BEFORE ISSUING A FINAL NEGATIVE REPORT     Performed at [facility]   Report Status PENDING   Incomplete      07/19/2014, 1:42 PM     LOS: 3 days     **Disclaimer: This note may have been dictated with voice recognition software. Similar sounding words can inadvertently be transcribed and this note may contain transcription errors which may not have been corrected upon publication of note.**

## 2014-07-19 NOTE — Discharge Summary (Addendum)
Physician Discharge Summary  Cristina [name] JGO:[id number] DOB: 1986/08/03 DOA: 07/16/2014  PCP: No PCP Per Patient  Admit date: 07/16/2014 Discharge date: 07/19/2014  Time spent: Greater than 30 minutes  Recommendations for Outpatient Follow-up:  1. [place]: Follow up for PCP. 2. Dr. Rosalin Hawking, Neurology in 2 months. 3. Dr. Bobby Rumpf, Infectious Disease in 1 week. FU of outstanding work up sent from the hospital. 4. Out patient PT & OT.  Discharge Diagnoses:  Principal Problem:   CVA (cerebral infarction) Active Problems:   Hypokalemia   Anemia, unspecified   Migraine   Discharge Condition: Improved & Stable  Diet recommendation: Heart healthy diet.  Filed Weights   07/17/14 0350 07/18/14 0544 07/19/14 0400  Weight: 47.991 kg (105 lb 12.8 oz) 47.799 kg (105 lb 6 oz) 48 kg (105 lb 13.1 oz)    History of present illness:  28 year old white female with history of migraine, presented to the ED on 07/16/14 with acute onset of left-sided weakness & numbness-upper extremity >lower extremity, transient facial droop/slurred speech/swallowing difficulty, without headache which started at approximately 745 PM on 7/22. Patient was not administered TPA secondary to improvement in symptoms. CT head was negative for acute infarct. MRI head showed multiple infarcts in the right MCA territory. Neurology was consulted.  Hospital Course:   1. 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 Admitted to telemetry which shows sinus bradycardia in the 40s-50s & SR. SB is probably physiologic for her age. Stroke workup complete. Stroke risk factors include cigarette smoking, OCP use and migraines. CTA head showed intermittent occlusion of right MCA insular branch. CTA neck normal. MRI shows progression of right MCA infarct and new left MCA-ACA watershed frontal lobe infarcts.  Neurology consultation and followup appreciated-requested hypercoagulable panel, vasculitic panel-drawn and many results pending.TCD bubble study negative. Lower extremity venous Dopplers without DVT. Patient was on no antithrombotic prior to admission and now on aspirin 325 mg daily for secondary stroke prevention. PT and OT evaluated. As per neurology, started Lipitor 10 mg daily. 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 ID consulted-does not seem to show any overt signs of infection. Holding off starting antibiotics at this point. Extensive labs sent (repeat blood culture, fungal and Bartonella antibody panel, cryoglobulins, hepatitis A, B, and C). Blood cultures are negative to date. As discussed with ID and neurology, patient will be discharged home with close followup with ID in 1 week and neurology in 2 months. 2. Hypokalemia: Replaced 3. Anemia: Possibly chronic. Stable. Outpatient followup and workup as deemed necessary. Case manager has arranged for patient to call for PCP. 4. History of migraine: Asymptomatic. 5. History of tobacco abuse: Cessation counseled. Smokes one to one and a half packs per day for the last 10 years. Continue nicotine patch. 6. History of substance abuse/THC: Cessation counseled. Last done on the day of admission.  7. ANA positive: ? Significance. OP follow up.   Consultations:  Neurology  Cardiology  Infectious disease  Procedures:  TEE    Discharge Exam:  Complaints:  Left upper extremity strength improving. Denies any other complaints.  Filed Vitals:   07/19/14 0400 07/19/14 0850 07/19/14 1254 07/19/14 1654  BP: 116/66 126/87 121/74 126/77  Pulse: 53 69 76 65  Temp: 97.9 F (36.6 C) 97.5 F (36.4 C) 98 F (36.7 C) 98.3 F (36.8 C)  TempSrc: Oral Oral Oral Oral  Resp: 18 18 18 18   Height:  Weight: 48 kg (105 lb 13.1 oz)     SpO2: 98% 100% 100% 100%    General exam: Pleasant young female lying comfortably in bed. No peripheral  stigmata for bacterial endocarditis. Respiratory system: Clear. No increased work of breathing.  Cardiovascular system: S1 & S2 heard, RRR. No JVD, murmurs, gallops, clicks or pedal edema. Telemetry: Sinus bradycardia in the 50s & SR.  Gastrointestinal system: Abdomen is nondistended, soft and nontender. Normal bowel sounds heard.  Central nervous system: Alert and oriented. No cranial nerve deficits.  Extremities: 5 x 5 power in right limbs. Left upper extremity: Proximally grade 3 x 5 power and distally in hand grip and finger movement grade 4 x 5 power. Left hemisensory deficit- better.   Discharge Instructions      Discharge Instructions   Call MD for:    Complete by:  As directed   Worsening or new stroke like symptoms.     Diet - low sodium heart healthy    Complete by:  As directed      Increase activity slowly    Complete by:  As directed             Medication List         aspirin EC 325 MG tablet  Take 1 tablet (325 mg total) by mouth daily.     atorvastatin 10 MG tablet  Commonly known as:  LIPITOR  Take 1 tablet (10 mg total) by mouth daily at 6 PM.     loratadine 10 MG tablet  Commonly known as:  CLARITIN  Take 10 mg by mouth daily.     nicotine 21 mg/24hr patch  Commonly known as:  NICODERM CQ - dosed in mg/24 hours  Place 1 patch (21 mg total) onto the skin daily.     ORTHO TRI-CYCLEN LO 0.18/0.215/0.25 MG-25 MCG tab  Generic drug:  Norgestimate-Ethinyl Estradiol Triphasic  Take 1 tablet by mouth daily.       Follow-up Information   Follow up with [facility] (Outpatient Neuro Re[facility]- for Physical and Occupational Services- Please call them if they have not called within 2 business days. Thanks)    Specialty:  Customer service manager information:   [address] [place] [id number] [place] [address] [phone]      Schedule an appointment as soon as possible for a visit with [place]. (For Hospital Follow up for Primary Care Provider)    Contact information:   [place] [address] [phone]      Follow up with Xu,Jindong, MD. Schedule an appointment as soon as possible for a visit in 2 months.   Specialty:  Neurology   Contact information:   [address] [place] [address] [phone]       Follow up with Bobby Rumpf, MD. Schedule an appointment as soon as possible for a visit in 1 week.   Specialty:  Infectious Diseases   Contact information:   [place] [address] [phone]        The results of significant diagnostics from this hospitalization (including imaging, microbiology, ancillary and laboratory) are listed below for reference.    Significant Diagnostic Studies: Ct Angio Head W/cm &/or Wo Cm  07/16/2014   CLINICAL DATA:  Stroke.  EXAM: CT ANGIOGRAPHY HEAD AND NECK  TECHNIQUE: Multidetector CT imaging of the head and neck was performed using the standard protocol during  bolus administration of intravenous contrast. Multiplanar CT image reconstructions and MIPs were obtained to evaluate the vascular anatomy. Carotid stenosis measurements (when applicable) are obtained utilizing NASCET criteria, using the distal internal carotid diameter as the denominator.  CONTRAST:  44m OMNIPAQUE IOHEXOL 300 MG/ML  SOLN  COMPARISON:  MRI of the head July 16, 2014  FINDINGS: CTA HEAD FINDINGS  Anterior circulation: Normal appearance of the cervical internal carotid arteries, petrous, cavernous and supra clinoid internal carotid arteries. Widely patent anterior communicating artery. Focal occlusion of right M2/3 insular branch, with reconstitution and subsequent high-grade stenosis versus occlusion. In addition, relative asymmetric decreased number of distal right anterior cerebral artery branches, axial 8/59.  Posterior circulation: Left vertebral artery is  dominant, with normal appearance of the vertebral arteries, vertebrobasilar junction and basilar artery, as well as main branch vessels. Small bilateral posterior communicating arteries are present. Normal appearance of the posterior cerebral arteries.  No large vessel occlusion, dissection, contrast extravasation or aneurysm within the anterior nor posterior circulation.  Right parietal transcortical encephalomalacia. No intraparenchymal hemorrhage, mass effect or midline shift. Right choroidal fissure cyst.  Review of the MIP images confirms the above findings.  CTA NECK FINDINGS  Normal appearance of the thoracic arch, two-vessel arch is a normal variant. The origins of the innominate, left Common carotid artery and subclavian artery are widely patent.  Bilateral Common carotid arteries are widely patent, coursing in a straight line fashion. Normal appearance of the carotid bifurcations without hemodynamically significant stenosis by NASCET criteria. Normal appearance of the included internal carotid arteries.  Left vertebral artery is dominant. Normal appearance of the vertebral arteries, which appear widely patent.  No hemodynamically significant stenosis by NASCET criteria. No dissection, no pseudoaneurysm. No abnormal luminal irregularity. No contrast extravasation.  Soft tissues are unremarkable. No acute osseous process though bone windows have not been submitted.  Review of the MIP images confirms the above findings.  IMPRESSION: CTA head: Intermittent occlusion of right middle cerebral artery insular branch, which could reflect thromboembolic disease or possible vasculopathy. In addition, relative asymmetric, decreased distal right anterior cerebral artery vessels, equivocal for clinically significant finding, could reflect normal variant. Findings may be better characterized on MRI of the head with and without contrast including gradient sequences.  Right parietal transcortical encephalomalacia may  reflect remote infarct or possibly head trauma.  CTA neck:  Normal CT angiogram of the neck.  Preliminary findings discussed with and reconfirmed by Dr.[facility]22/2015[facility]2230 hr.   Electronically Signed   By: CElon Alas  On: 07/16/2014 23:45   Dg Chest 2 View  07/17/2014   CLINICAL DATA:  Unable to lift arm secondary to stroke.  EXAM: CHEST  2 VIEW  COMPARISON:  None  FINDINGS: The heart size and mediastinal contours are within normal limits. Both lungs are clear. The visualized skeletal structures are unremarkable.  IMPRESSION: No active cardiopulmonary disease.   Electronically Signed   By: HKathreen Devoid  On: 07/17/2014 13:31   Ct Head Wo Contrast  07/16/2014   CLINICAL DATA:  Left-sided weakness. LEFT arm weakness. Code stroke.  EXAM: CT HEAD WITHOUT CONTRAST  TECHNIQUE: Contiguous axial images were obtained from the base of the skull through the vertex without intravenous contrast.  COMPARISON:  None.  FINDINGS: No mass lesion, mass effect, midline shift, hydrocephalus, hemorrhage. No territorial ischemia or acute infarction. RIGHT choroidal fissure cyst incidentally noted. Paranasal sinuses appear within normal limits. Calvarium intact.  IMPRESSION: Negative CT head. Critical Value/emergent results were called  by telephone at the time of interpretation on 07/16/2014 at 8:39 pm to Dr. Leonel Ramsay , who verbally acknowledged these results.   Electronically Signed   By: Dereck Ligas M.D.   On: 07/16/2014 20:40   Ct Angio Neck W/cm &/or Wo/cm  07/16/2014   CLINICAL DATA:  Stroke.  EXAM: CT ANGIOGRAPHY HEAD AND NECK  TECHNIQUE: Multidetector CT imaging of the head and neck was performed using the standard protocol during bolus administration of intravenous contrast. Multiplanar CT image reconstructions and MIPs were obtained to evaluate the vascular anatomy. Carotid stenosis measurements (when applicable) are obtained utilizing NASCET criteria, using the distal internal carotid  diameter as the denominator.  CONTRAST:  10m OMNIPAQUE IOHEXOL 300 MG/ML  SOLN  COMPARISON:  MRI of the head July 16, 2014  FINDINGS: CTA HEAD FINDINGS  Anterior circulation: Normal appearance of the cervical internal carotid arteries, petrous, cavernous and supra clinoid internal carotid arteries. Widely patent anterior communicating artery. Focal occlusion of right M2/3 insular branch, with reconstitution and subsequent high-grade stenosis versus occlusion. In addition, relative asymmetric decreased number of distal right anterior cerebral artery branches, axial 8/59.  Posterior circulation: Left vertebral artery is dominant, with normal appearance of the vertebral arteries, vertebrobasilar junction and basilar artery, as well as main branch vessels. Small bilateral posterior communicating arteries are present. Normal appearance of the posterior cerebral arteries.  No large vessel occlusion, dissection, contrast extravasation or aneurysm within the anterior nor posterior circulation.  Right parietal transcortical encephalomalacia. No intraparenchymal hemorrhage, mass effect or midline shift. Right choroidal fissure cyst.  Review of the MIP images confirms the above findings.  CTA NECK FINDINGS  Normal appearance of the thoracic arch, two-vessel arch is a normal variant. The origins of the innominate, left Common carotid artery and subclavian artery are widely patent.  Bilateral Common carotid arteries are widely patent, coursing in a straight line fashion. Normal appearance of the carotid bifurcations without hemodynamically significant stenosis by NASCET criteria. Normal appearance of the included internal carotid arteries.  Left vertebral artery is dominant. Normal appearance of the vertebral arteries, which appear widely patent.  No hemodynamically significant stenosis by NASCET criteria. No dissection, no pseudoaneurysm. No abnormal luminal irregularity. No contrast extravasation.  Soft tissues are  unremarkable. No acute osseous process though bone windows have not been submitted.  Review of the MIP images confirms the above findings.  IMPRESSION: CTA head: Intermittent occlusion of right middle cerebral artery insular branch, which could reflect thromboembolic disease or possible vasculopathy. In addition, relative asymmetric, decreased distal right anterior cerebral artery vessels, equivocal for clinically significant finding, could reflect normal variant. Findings may be better characterized on MRI of the head with and without contrast including gradient sequences.  Right parietal transcortical encephalomalacia may reflect remote infarct or possibly head trauma.  CTA neck:  Normal CT angiogram of the neck.  Preliminary findings discussed with and reconfirmed by Dr.MCNEILL [facility]/22/2015at2230 hr.   Electronically Signed   By: CElon Alas  On: 07/16/2014 23:45   Mr Brain Wo Contrast  07/16/2014   CLINICAL DATA:  Left-sided weakness, numbness and new onset confusion.  EXAM: MRI HEAD WITHOUT CONTRAST  TECHNIQUE: Multiplanar, multiecho pulse sequences of the brain and surrounding structures were obtained without intravenous contrast.  COMPARISON:  Head CT same day  FINDINGS: Diffusion imaging in the axial coronal planes shows multiple punctate foci of restricted diffusion within the right middle cerebral artery territory affecting the posterior temporal and parietal regions. The largest insult is only about  3 mm in size. No sign of swelling or hemorrhage. No lesion seen in left hemisphere or in the cerebellum or affecting the brainstem.  No evidence of hydrocephalus. No evidence of mass lesion or extra-axial collection.  IMPRESSION: Numerous punctate acute infarctions in the right posterior temporal and parietal region consistent with micro embolic infarctions in the right middle cerebral artery territory.   Electronically Signed   By: Nelson Chimes M.D.   On: 07/16/2014 21:25   Mr Jeri Cos  PT Contrast  07/17/2014   CLINICAL DATA:  28 year old female presented with left upper extremity weakness and left facial droop found to have small acute right hemisphere infarcts on MRI, subsequently right MCA M2 disease on CTA. Headache upon presentation. Progression of left side weakness arm greater than leg. Initial encounter.  EXAM: MRI HEAD WITHOUT AND WITH CONTRAST  TECHNIQUE: Multiplanar, multiecho pulse sequences of the brain and surrounding structures were obtained without and with intravenous contrast.  CONTRAST:  10 mL MultiHance.  COMPARISON:  Head and neck CTA 07/16/2014.  Brain MRI 07/16/2014.  FINDINGS: Progression of right MCA restricted diffusion, now with confluent involvement in the operculum, right parietal lobe, and scattered cortical diffusion restriction in the superior peri-Rolandic and pre motor areas.  There is new confluent restricted diffusion in the left anterior superior frontal gyrus, pre motor left ACA/ MCA watershed territory. There are also scattered small right occipital lobe and possibly also left posterior temporal lobe lacunar type areas of restricted diffusion (series 3, images 11 and 12).  No deep gray matter nuclei, or posterior fossa restricted diffusion. Major intracranial vascular flow voids are preserved, although there is abnormal FLAIR signal in the dominant right MCA posterior sylvian division, corresponding to the CTA findings (series 7, images 10-14).  Associated mild T2 and FLAIR hyperintensity in the affected areas. No mass effect. No definite hemorrhagic transformation ; asymmetry on susceptibility weighted imaging is felt primarily due to vascular thrombus or asymmetric slow vessel flow. No asymmetry of the major intracranial venous structures identified.  No abnormal enhancement identified.  Small right mesial temporal lobe hippocampal fissure all cyst or arachnoid cyst re- identified. No significant intracranial mass lesion. No ventriculomegaly. No midline  shift. Patent basilar cisterns. Negative pituitary, cervicomedullary junction and visualized cervical spine. Visible internal auditory structures appear normal. Mastoids are clear. Minor paranasal sinus mucosal thickening. Visualized orbit soft tissues are within normal limits. Visualized scalp soft tissues are within normal limits.  IMPRESSION: 1. Interval progression of right MCA infarcts. Right operculum and parietal lobe confluent [place] affected now. Small cortical infarcts in the peri-Rolandic region. 2. New left superior frontal gyrus, left ACA - left MCA watershed type, infarct. Small right occipital lacunar infarcts, possibly also in the posterior left temporal lobe. 3. The constellation of recent CTA and MRI data suggests a proximal aortic or cardiac source of emboli. 4. No mass effect or hemorrhage.  No abnormal enhancement. 5. Major vascular flow voids appear stable to the recent CTA, including evidence of abnormal flow in the right MCA dominant posterior sylvian branch. Study discussed by telephone with Dr. Rosalin Hawking on 07/17/2014 at 13:52 .   Electronically Signed   By: Lars Pinks M.D.   On: 07/17/2014 13:55    Microbiology: Recent Results (from the past 240 hour(s))  CULTURE, BLOOD (ROUTINE X 2)     Status: None   Collection Time    07/17/14 10:25 AM      Result Value Ref Range Status   Specimen Description BLOOD  LEFT HAND   Final   Special Requests BOTTLES DRAWN AEROBIC AND ANAEROBIC 10CC   Final   Culture  Setup Time     Final   Value: 07/17/2014 14:13     Performed at [facility]   Culture     Final   Value:        BLOOD CULTURE RECEIVED NO GROWTH TO DATE CULTURE WILL BE HELD FOR 5 DAYS BEFORE ISSUING A FINAL NEGATIVE REPORT     Performed at [facility]   Report Status PENDING   Incomplete  CULTURE, BLOOD (ROUTINE X 2)     Status: None   Collection Time    07/17/14 10:35 AM      Result Value Ref Range Status   Specimen Description BLOOD RIGHT HAND   Final    Special Requests BOTTLES DRAWN AEROBIC AND ANAEROBIC 10CC   Final   Culture  Setup Time     Final   Value: 07/17/2014 14:13     Performed at [facility]   Culture     Final   Value:        BLOOD CULTURE RECEIVED NO GROWTH TO DATE CULTURE WILL BE HELD FOR 5 DAYS BEFORE ISSUING A FINAL NEGATIVE REPORT     Performed at [facility]   Report Status PENDING   Incomplete     Labs: Basic Metabolic Panel:  Recent Labs Lab 07/16/14 2024 07/16/14 2032 07/17/14 0355  NA 138 138 142  K 3.3* 3.0* 4.1  CL 101 102 108  CO2 22  --  22  GLUCOSE 145* 147* 117*  BUN 9 7 8   CREATININE 1.13* 1.30* 0.89  CALCIUM 9.2  --  8.5   Liver Function Tests:  Recent Labs Lab 07/16/14 2024 07/17/14 0355  AST 20 16  ALT 11 9  ALKPHOS 46 37*  BILITOT 0.5 0.4  PROT 7.7 6.3  ALBUMIN 3.8 3.2*   No results found for this basename: LIPASE, AMYLASE,  in the last 168 hours No results found for this basename: AMMONIA,  in the last 168 hours CBC:  Recent Labs Lab 07/16/14 2024 07/16/14 2032 07/17/14 0355 07/18/14 0701  WBC 6.6  --  6.1 5.0  NEUTROABS 2.7  --  3.5  --   HGB 13.0 13.6 11.6* 11.2*  HCT 37.3 40.0 33.7* 32.6*  MCV 93.5  --  94.1 93.9  PLT 176  --  153 147*   Cardiac Enzymes:  Recent Labs Lab 07/16/14 2024  CKTOTAL 103  CKMB 1.4  TROPONINI <0.30   BNP: BNP (last 3 results) No results found for this basename: PROBNP,  in the last 8760 hours CBG:  Recent Labs Lab 07/16/14 2128  GLUCAP 94    Additional labs:   Lipid panel: Cholesterol 104, triglycerides 54, HDL 53, LDL 14 VLDL 11  Hemoglobin A1c: 5.5  TSH: 1.060  Urine pregnancy test: Negative  UDS: Negative. Blood alcohol level <11  2-D echo 07/17/14: Study Conclusions  - Left ventricle: The cavity size was normal. Systolic function was normal. The estimated ejection fraction was in the range of 55% to 60%. Wall motion was normal; there were no regional wall motion abnormalities. - Atrial  septum: No defect or patent foramen ovale was identified.  Homocysteine: 11.7, ESR 11, antithrombin 3:85, C3 complement 88, C4 complement 18   RPR nonreactive, HIV antibody nonreactive   Folate: 14.6, vitamin B12: 313  Lupus anticoagulant: Negative  ANA positive. ANA titer 1 negative,  rheumatoid factor <10, c-ANCA screen/p-ANCA screen/atypical P-ANCA screen negative, C3 complement 88, C4 complement 18, CH 50:52, SSA and SSB <1   Addendum: Discussed with Neurology Dr. Erlinda Hong on 07/20/14 and he recommended discontinuing oral contraceptive pills-especially those containing estrogen. Called patient on 7/28 and advised her not to take the OCPs that she was on, follow alternate contraceptive measures i.e. condoms, abstinence and followup with her OB/GYN regarding alternate medications. She was also advised to take over-the-counter vitamin B12 supplements.  Signed:  Vernell Leep, MD, FACP, FHM. [facility] Pager [phone]  If 7PM-7AM, please contact night-coverage [email or web address] Password [email or web address] 07/19/2014, 5:23 PM

## 2014-07-19 NOTE — Progress Notes (Signed)
Stroke Team Progress Note  HISTORY Cristina [name] is a 28 y.o. female with a history of migraines and anxiety who presents with left arm weakness that started the night of 07/16/2014 at 740 pm. She stated that she noticed that she tried to tell her son something and was unable to say it. Her friend then looked at her mouth and noticed it was drooping and insisted that she come to the ER. She had some improvement after arrival. She denied IV drug use. She denied any drug use other than occasional marijuana. She does take birth control and smokes. She has a history of migraines and had one the night prior to admission, but no headache when initially seen. She stated that it was her typical migraine. She stated taht she does get headacehs several times per month, but that they are much better than when she was younger. She had some shortness of breath en route, but no further symptoms. Patient was not administered TPA secondary to mild improving symptoms.  She was admitted for further evaluation and treatment.  SUBJECTIVE No family members present. The patient is still having fine motor movement difficulties with her left upper extremity. LE weakness largely resolved  OBJECTIVE Most recent Vital Signs: Filed Vitals:   07/18/14 2000 07/19/14 0000 07/19/14 0400 07/19/14 0850  BP: 118/71 99/51 116/66 126/87  Pulse: 90 58 53 69  Temp: 98.9 F (37.2 C) 98 F (36.7 C) 97.9 F (36.6 C) 97.5 F (36.4 C)  TempSrc: Oral Oral Oral Oral  Resp: 18 18 18 18   Height:      Weight:   105 lb 13.1 oz (48 kg)   SpO2: 98% 97% 98% 100%   CBG (last 3)   Recent Labs  07/16/14 2128  GLUCAP 94    IV Fluid Intake:      MEDICATIONS  . aspirin  325 mg Oral Daily  . atorvastatin  10 mg Oral q1800  . enoxaparin (LOVENOX) injection  40 mg Subcutaneous Q24H  . loratadine  10 mg Oral Daily  . nicotine  21 mg Transdermal Daily  . vitamin B-12  1,000 mcg Oral Daily   PRN:  senna-docusate  Diet:  Cardiac today  for TEE Activity:  OOB with assistance DVT Prophylaxis:  Lovenox 40 mg sq daily   CLINICALLY SIGNIFICANT STUDIES Basic Metabolic Panel:   Recent Labs Lab 07/16/14 2024 07/16/14 2032 07/17/14 0355  NA 138 138 142  K 3.3* 3.0* 4.1  CL 101 102 108  CO2 22  --  22  GLUCOSE 145* 147* 117*  BUN 9 7 8   CREATININE 1.13* 1.30* 0.89  CALCIUM 9.2  --  8.5   Liver Function Tests:   Recent Labs Lab 07/16/14 2024 07/17/14 0355  AST 20 16  ALT 11 9  ALKPHOS 46 37*  BILITOT 0.5 0.4  PROT 7.7 6.3  ALBUMIN 3.8 3.2*   CBC:   Recent Labs Lab 07/16/14 2024  07/17/14 0355 07/18/14 0701  WBC 6.6  --  6.1 5.0  NEUTROABS 2.7  --  3.5  --   HGB 13.0  < > 11.6* 11.2*  HCT 37.3  < > 33.7* 32.6*  MCV 93.5  --  94.1 93.9  PLT 176  --  153 147*  < > = values in this interval not displayed. Coagulation:   Recent Labs Lab 07/16/14 2024  LABPROT 12.6  INR 0.94   Cardiac Enzymes:   Recent Labs Lab 07/16/14 2024  CKTOTAL 103  CKMB  1.4  TROPONINI <0.30   Urinalysis:   Recent Labs Lab 07/16/14 2153  COLORURINE YELLOW  LABSPEC 1.008  PHURINE 6.0  GLUCOSEU NEGATIVE  HGBUR NEGATIVE  BILIRUBINUR NEGATIVE  KETONESUR NEGATIVE  PROTEINUR NEGATIVE  UROBILINOGEN 0.2  NITRITE NEGATIVE  LEUKOCYTESUR NEGATIVE   Lipid Panel    Component Value Date/Time   CHOL 104 07/17/2014 0355   TRIG 54 07/17/2014 0355   HDL 53 07/17/2014 0355   CHOLHDL 2.0 07/17/2014 0355   VLDL 11 07/17/2014 0355   LDLCALC 40 07/17/2014 0355   HgbA1C  Lab Results  Component Value Date   HGBA1C 5.5 07/17/2014    Urine Drug Screen:     Component Value Date/Time   LABOPIA NONE DETECTED 07/16/2014 2153   COCAINSCRNUR NONE DETECTED 07/16/2014 2153   LABBENZ NONE DETECTED 07/16/2014 2153   AMPHETMU NONE DETECTED 07/16/2014 2153   THCU NONE DETECTED 07/16/2014 2153   LABBARB NONE DETECTED 07/16/2014 2153    Alcohol Level:   Recent Labs Lab 07/16/14 2024  ETH <11    CT Angio Head  07/16/2014     Intermittent occlusion of right middle cerebral artery insular branch, which could reflect thromboembolic disease or possible vasculopathy. In addition, relative asymmetric, decreased distal right anterior cerebral artery vessels, equivocal for clinically significant finding, could reflect normal variant. Findings may be better characterized on MRI of the head with and without contrast including gradient sequences.  Right parietal transcortical encephalomalacia may reflect remote infarct or possibly head trauma.   CT Angio Neck  07/16/2014   Normal CT angiogram of the neck.    CT of the brain  07/16/2014    Negative CT head.   MRI of the brain without contrast   07/16/2014    Numerous punctate acute infarctions in the right posterior temporal and parietal region consistent with micro embolic infarctions in the right middle cerebral artery territory.    MRI of the brain 07/17/2014 1. Interval progression of right MCA infarcts. Right operculum and parietal lobe confluent Cristina [name] affected now. Small cortical infarcts in the peri-Rolandic region.  2. New left superior frontal gyrus, left ACA - left MCA watershed type, infarct. Small right occipital lacunar infarcts, possibly also  in the posterior left temporal lobe.  3. The constellation of recent CTA and MRI data suggests a proximal aortic or cardiac source of emboli.  4. No mass effect or hemorrhage. No abnormal enhancement.  5. Major vascular flow voids appear stable to the recent CTA, including evidence of abnormal flow in the right MCA dominant  posterior sylvian branch.  Lower extremity Dopplers 07/17/2014 No evidence of deep vein or superficial thrombosis involving the right lower extremity and left lower extremity. - . Incidental findings are consistent with: Baker's Cyst on the left. - No evidence of Baker&'s cyst on the right.  2D Echocardiogram - ejection fraction 55-60%. No cardiac source of emboli identified.  CXR - No active  cardiopulmonary disease.  EKG - sinus tachycardia. For complete results please see formal report.   TCD with bubble 07/17/2014 Impression: negative Transcranial Doppler Bubble Study indicative of no right to left shunt.  Hypercoagulable Panel ANA - positive Rheumatoid factor - < 10 C3 complement - 88 low C4 complement - 18 within normal limits HIV - nonreactive Antithrombin III - 85 within normal limits Serum homocystine - 11.7 within normal limits RPR - nonreactive Sedimentation rate - 11 within normal limits Antinuclear body titer - negative Total complement - pending Protein C activity - 114 within  normal limits Protein C total - 76 low normal Protein S. activity - 61 slightly low Protein S. Total - 58 slightly low  Lupus anticoagulant - 38.4 within normal limits Prothrombin gene mutation - pending Cardiolipin antibodies - IgG 6; IgM 3; IgA 2 - all low Factor V Leiden - pending Beta-2 glycoprotein - within normal limits ANCA screen - negative Sojourn syndrome A and B - negative  TEE 07/18/2014 1. Small mobile vegetation on the tip of the anterior mitral leaflet - this could represent endocarditis or Libman-Sacks endocarditis.  2. No LAA thrombus  3. No PFO or atrial septal defect  4. Normal LV function and wall motion   Therapy Recommendations - no followup PT recommended. Outpatient OT along with 24-hour per day supervision/assistance.  Physical Exam    Temp:  [97.4 F (36.3 C)-98.9 F (37.2 C)] 97.5 F (36.4 C) (07/25 0850) Pulse Rate:  [47-131] 69 (07/25 0850) Resp:  [10-20] 18 (07/25 0850) BP: (99-160)/(51-99) 126/87 mmHg (07/25 0850) SpO2:  [89 %-100 %] 100 % (07/25 0850) Weight:  [105 lb 13.1 oz (48 kg)] 105 lb 13.1 oz (48 kg) (07/25 0400)  General - Well nourished, well developed, in no apparent distress.  Ophthalmologic - Sharp disc margins OU.  Cardiovascular - Regular rate and rhythm with no murmur.  Mental Status -  Level of arousal and  orientation to time, place, and person were intact. Language including expression, naming, repetition, comprehension, reading, and writing was assessed and found intact. Attention span and concentration were normal.  Cranial Nerves II - XII - II - Vision intact OU. III, IV, VI - Extraocular movements intact. V - Facial sensation intact bilaterally. VII - left lower facial weakness, improved. VIII - Hearing & vestibular intact bilaterally. X - Palate elevates symmetrically. XI - Chin turning & shoulder shrug intact bilaterally. XII - Tongue protrusion intact.  Motor Strength - The patient's strength was 4+/5 proximally at left UE, 3/5 distally LUE and 5-/5 LLE and pronator drift was present on the left. Fine motor movement difficulties with left upper extremity. Bulk was normal and fasciculations were absent.   Motor Tone - Muscle tone was assessed at the neck and appendages and was normal.  Reflexes - The patient's reflexes were normal in all extremities and she had no pathological reflexes.  Sensory - Light touch decreased on the right.   Coordination - The patient had normal movements in right hand and foot with no ataxia or dysmetria.  Tremor was absent.  Gait and Station - mild hemiparetic gait on the left but able to walk with minimal assistance.   ASSESSMENT Cristina [name] is a 28 y.o. female presenting with left arm weakness. 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 Patient does have a history of cigarette smoking, OCP use and migraines. She does report a headache the day prior to admission. On no antithrobotics prior to admission. Now on aspirin 325 mg orally every day for secondary stroke prevention. Patient with resultant left hemiparesis (arm>leg), left hemisensory deficits, left lower facial droop. Stroke work up completed except some hypercoagulable work up pending. ID consulted.   Hx migraine  LDL 40  OCP  use Cigarette smoker, advised to stop smoking Urine drug screen negative LE dopplers - negative as above TCD with bubble did not show PFO TSH - 1.06 within normal limits TEE showed MV vegetation, etiology unclear  Hospital day # 3  TREATMENT/PLAN  Continue aspirin 325 mg orally every day  for secondary stroke prevention.  Low dose lipitor for stroke prevention  MRI repeated 07/17/2014 since pt had progression of her symptoms. (See above) Interval progression of right MCA infarcts, consistent with cardioembolic stroke.  Cancel MRV (Dr. Roda ShuttersXu discussed with radiology Dr. Mosetta PuttGrady, do not feel there is cortical venous thrombosis) TEE - Small mobile vegetation on the tip of the anterior mitral leaflet - this could represent endocarditis or Libman-Sacks endocarditis. - ID consult pending Check blood cultures 07/17/2013 - no growth so far Full Hypercoagulable panel as above, some labs pending Consider rheumatology consult if necessary Therapy evals - no further physical therapy. Outpatient occupational therapy recommended Pt needs to follow up with Dr. Roda ShuttersXu in one month after discharge.  Delton Seeavid Rinehuls PA-C [facility] Pager [phone] 07/19/2014, 11:38 AM  SIGNED I, the attending vascular neurologist, have personally obtained a history, examined the patient, evaluated laboratory data, individually viewed imaging studies, and formulated the assessment and plan of care.  I have made any additions or clarifications directly to the above note and agree with the findings and plan as currently documented.   Marvel PlanJindong Reeva Davern, MD PhD 07/19/2014 8:17 PM  To contact Stroke Continuity provider, please refer to [email or web address]. After hours, contact General Neurology

## 2014-07-21 ENCOUNTER — Encounter (HOSPITAL_COMMUNITY): Payer: Self-pay | Admitting: Internal Medicine

## 2014-07-21 LAB — ANTI-NUCLEAR AB-TITER (ANA TITER): ANA Titer 1: NEGATIVE

## 2014-07-21 LAB — PROTHROMBIN GENE MUTATION

## 2014-07-21 LAB — ANTI-SMITH ANTIBODY: ENA SM Ab Ser-aCnc: <1

## 2014-07-21 LAB — ANA: Anti Nuclear Antibody(ANA): POSITIVE — AB

## 2014-07-21 LAB — FACTOR 5 LEIDEN

## 2014-07-21 LAB — ANTI-DNA ANTIBODY, DOUBLE-STRANDED: DS DNA AB: 5 [IU]/mL — AB

## 2014-07-23 LAB — MTHFR MUTATION DETECTION

## 2014-07-23 LAB — BARTONELLA ANITBODY PANEL

## 2014-07-23 LAB — CHLAMYDIA ANTIBODIES, IGG
Chlamydia Pneumoniae IgG: <1:64 {titer}
Chlamydia Psittaci IgG: <1:64 {titer}
Chlamydia psittaci Ab IgM: <1:10 {titer}
Chlamydia trachomatis IgG: <1:64 {titer}
Chlamydia trachomatis IgM: <1:10 {titer}

## 2014-07-23 LAB — CULTURE, BLOOD (ROUTINE X 2)
CULTURE: NO GROWTH
Culture: NO GROWTH

## 2014-07-23 LAB — BARTONELLA ANTIBODY PANEL
B henselae IgG: NEGATIVE
B henselae IgM: NEGATIVE

## 2014-07-24 ENCOUNTER — Ambulatory Visit: Payer: Medicaid Other | Attending: Internal Medicine | Admitting: Physical Therapy

## 2014-07-24 ENCOUNTER — Ambulatory Visit: Payer: Medicaid Other | Admitting: Occupational Therapy

## 2014-07-24 DIAGNOSIS — R279 Unspecified lack of coordination: Secondary | ICD-10-CM | POA: Insufficient documentation

## 2014-07-24 DIAGNOSIS — IMO0001 Reserved for inherently not codable concepts without codable children: Secondary | ICD-10-CM | POA: Insufficient documentation

## 2014-07-24 DIAGNOSIS — I69998 Other sequelae following unspecified cerebrovascular disease: Secondary | ICD-10-CM | POA: Insufficient documentation

## 2014-07-24 DIAGNOSIS — M6281 Muscle weakness (generalized): Secondary | ICD-10-CM | POA: Insufficient documentation

## 2014-07-25 ENCOUNTER — Telehealth: Payer: Self-pay | Admitting: *Deleted

## 2014-07-25 ENCOUNTER — Ambulatory Visit (INDEPENDENT_AMBULATORY_CARE_PROVIDER_SITE_OTHER): Payer: Medicaid Other | Admitting: Infectious Diseases

## 2014-07-25 ENCOUNTER — Encounter: Payer: Self-pay | Admitting: Infectious Diseases

## 2014-07-25 VITALS — BP 113/74 | HR 65 | Temp 97.8°F | Ht 62.0 in | Wt 102.0 lb

## 2014-07-25 DIAGNOSIS — I058 Other rheumatic mitral valve diseases: Secondary | ICD-10-CM

## 2014-07-25 DIAGNOSIS — I059 Rheumatic mitral valve disease, unspecified: Secondary | ICD-10-CM

## 2014-07-25 LAB — FUNGAL ANTIBODIES PANEL, ID-BLOOD
ASPERGILLUS FLAVUS ANTIBODIES: NEGATIVE
ASPERGILLUS NIGER ANTIBODIES: NEGATIVE
Aspergillus fumigatus: NEGATIVE
Blastomyces Abs, Qn, DID: NEGATIVE
Coccidioides Antibody ID: NEGATIVE
Histoplasma Ab, Immunodiffusion: NEGATIVE

## 2014-07-25 LAB — CULTURE, BLOOD (ROUTINE X 2): CULTURE: NO GROWTH

## 2014-07-25 NOTE — Telephone Encounter (Signed)
[facility] be scheduled with Dr. Cyndie ChimeGranfortuna for hepatology. This clinic will be notified when patient is scheduled. Cristina [name]

## 2014-07-25 NOTE — Progress Notes (Signed)
   Subjective:    Patient ID: Cristina [name], female    DOB: 08/07/1986, 28 y.o.   MRN: [id number]  HPI 28 yo F with hx of migraine, admission on 7-23 with L arm weakness, facial droop, and dysarthria. She was found on MRI to have scattered R MCA infarcts. On f/u MRI these were noted to have progressed as well as new L ACA-MCA infarcts. She underwent TEE on 7-24 showing vegetation on mitral valve.  Tmax in hospital 99.4. WBC normal  HIV (-), RPR (-), BCx ngtd. UDS (-). Was d/c home on 07-22-14.  Feels like her weakness is getting better. Started OT yesterday.   Has quit smoking. Quit taking OCPs.  No f/c. No night sweats.   SOChx, FHx reviewed.   Review of Systems  Constitutional: Negative for fever, chills and unexpected weight change.  Cardiovascular: Negative for chest pain.  Gastrointestinal: Negative for diarrhea and constipation.  Genitourinary: Negative for difficulty urinating.  Neurological: Positive for weakness. Negative for headaches.       Objective:   Physical Exam  Constitutional: She is oriented to person, place, and time. She appears well-developed and well-nourished.  HENT:  Mouth/Throat: No oropharyngeal exudate.  Eyes: EOM are normal. Pupils are equal, round, and reactive to light.  Neck: Neck supple.  Cardiovascular: Normal rate, regular rhythm and normal heart sounds.   No murmur heard. Pulmonary/Chest: Effort normal and breath sounds normal.  Abdominal: Soft. Bowel sounds are normal. There is no tenderness. There is no rebound.  Musculoskeletal: She exhibits no edema.  Lymphadenopathy:    She has no cervical adenopathy.  Neurological: She is alert and oriented to person, place, and time. No sensory deficit. She exhibits normal muscle tone.  ? Deviation to R of tongue          Assessment & Plan:

## 2014-07-25 NOTE — Assessment & Plan Note (Signed)
I cannot find evidence of infection in her. Cx negative endocarditis can be difficult to diagnose. 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  Will have her seen by hematology.

## 2014-07-28 ENCOUNTER — Ambulatory Visit: Payer: Medicaid Other | Attending: Internal Medicine | Admitting: Occupational Therapy

## 2014-07-28 DIAGNOSIS — IMO0001 Reserved for inherently not codable concepts without codable children: Secondary | ICD-10-CM | POA: Diagnosis present

## 2014-07-28 DIAGNOSIS — R279 Unspecified lack of coordination: Secondary | ICD-10-CM | POA: Insufficient documentation

## 2014-07-28 DIAGNOSIS — M6281 Muscle weakness (generalized): Secondary | ICD-10-CM | POA: Diagnosis not present

## 2014-07-28 DIAGNOSIS — I69998 Other sequelae following unspecified cerebrovascular disease: Secondary | ICD-10-CM | POA: Insufficient documentation

## 2014-07-29 ENCOUNTER — Telehealth: Payer: Self-pay | Admitting: *Deleted

## 2014-07-29 ENCOUNTER — Encounter: Payer: Self-pay | Admitting: Internal Medicine

## 2014-07-29 ENCOUNTER — Ambulatory Visit: Payer: Medicaid Other | Attending: Internal Medicine | Admitting: Internal Medicine

## 2014-07-29 VITALS — BP 121/76 | HR 82 | Temp 98.7°F | Resp 16 | Wt 104.6 lb

## 2014-07-29 DIAGNOSIS — E785 Hyperlipidemia, unspecified: Secondary | ICD-10-CM | POA: Insufficient documentation

## 2014-07-29 DIAGNOSIS — Z823 Family history of stroke: Secondary | ICD-10-CM | POA: Insufficient documentation

## 2014-07-29 DIAGNOSIS — I639 Cerebral infarction, unspecified: Secondary | ICD-10-CM

## 2014-07-29 DIAGNOSIS — Z7982 Long term (current) use of aspirin: Secondary | ICD-10-CM | POA: Insufficient documentation

## 2014-07-29 DIAGNOSIS — D649 Anemia, unspecified: Secondary | ICD-10-CM | POA: Diagnosis not present

## 2014-07-29 DIAGNOSIS — Z87891 Personal history of nicotine dependence: Secondary | ICD-10-CM | POA: Diagnosis not present

## 2014-07-29 DIAGNOSIS — I6340 Cerebral infarction due to embolism of unspecified cerebral artery: Secondary | ICD-10-CM | POA: Diagnosis not present

## 2014-07-29 DIAGNOSIS — G43909 Migraine, unspecified, not intractable, without status migrainosus: Secondary | ICD-10-CM | POA: Insufficient documentation

## 2014-07-29 DIAGNOSIS — Z139 Encounter for screening, unspecified: Secondary | ICD-10-CM

## 2014-07-29 LAB — COMPLETE METABOLIC PANEL WITH GFR
ALBUMIN: 4.2 g/dL (ref 3.5–5.2)
ALT: 14 U/L (ref 0–35)
AST: 22 U/L (ref 0–37)
Alkaline Phosphatase: 47 U/L (ref 39–117)
BUN: 13 mg/dL (ref 6–23)
CALCIUM: 9.6 mg/dL (ref 8.4–10.5)
CO2: 30 mEq/L (ref 19–32)
CREATININE: 1.05 mg/dL (ref 0.50–1.10)
Chloride: 101 mEq/L (ref 96–112)
GFR, EST AFRICAN AMERICAN: 84 mL/min
GFR, Est Non African American: 73 mL/min
Glucose, Bld: 73 mg/dL (ref 70–99)
Potassium: 4.3 mEq/L (ref 3.5–5.3)
Sodium: 139 mEq/L (ref 135–145)
Total Bilirubin: 0.8 mg/dL (ref 0.2–1.2)
Total Protein: 7.1 g/dL (ref 6.0–8.3)

## 2014-07-29 NOTE — Progress Notes (Signed)
Patient Demographics  Cristina BalesJennifer [name], is a 28 y.o. female  [id number]  [id number]  DOB - 01/25/1986  CC:  Chief Complaint  Patient presents with  . Follow-up    stroke       HPI: Cristina BalesJennifer [name] is a 28 y.o. female here today to establish medical care.Patient has history of migraine headaches, she was recently admitted with the symptoms of left-sided weakness numbness facial droop slurred speech, she had a CT scan done which was negative for any acute infarct she had an MRI done which showed multiple infarcts in right MCA, neurology was on board, she had a stroke workup done, she was started on aspirin, PT OT evaluated the patient, she was also started on Lipitor she had a transesophageal echocardiogram done which reported small mobile mitral valve vagetations, ID was on board and did not recommend antibiotics her blood cultures were negative, she was advised to follow with neurology, she also found to have anemia patient already followed up with ID and was recommended to see a hematologist. Patient has stopped taking OCPs and already quit smoking. Patient has No headache, No chest pain, No abdominal pain - No Nausea, No new weakness tingling or numbness, No Cough - SOB.  No Known Allergies Past Medical History  Diagnosis Date  . Stroke 06/2014  . Endocarditis of mitral valve     libman sachs?   Current Outpatient Prescriptions on File Prior to Visit  Medication Sig Dispense Refill  . aspirin EC 325 MG tablet Take 1 tablet (325 mg total) by mouth daily.  30 tablet  0  . atorvastatin (LIPITOR) 10 MG tablet Take 1 tablet (10 mg total) by mouth daily at 6 PM.  30 tablet  0  . loratadine (CLARITIN) 10 MG tablet Take 10 mg by mouth daily.      . nicotine (NICODERM CQ - DOSED IN MG/24 HOURS) 21 mg/24hr patch Place 1 patch (21 mg total) onto the skin daily.  28 patch  0  . Norgestimate-Ethinyl Estradiol Triphasic (ORTHO TRI-CYCLEN LO) 0.18/0.215/0.25 MG-25 MCG tab Take 1  tablet by mouth daily.       No current facility-administered medications on file prior to visit.   Family History  Problem Relation Age of Onset  . CAD Mother   . Heart disease Mother   . Stroke Other     uncle 1350s, grandfather 2360s  . Stroke Paternal Grandfather   . Cancer Paternal Grandfather     lung cancer    History   Social History  . Marital Status: Single    Spouse Name: N/A    Number of Children: N/A  . Years of Education: N/A   Occupational History  . Not on file.   Social History Main Topics  . Smoking status: Former Smoker -- 1.50 packs/day for 10 years    Quit date: 07/16/2014  . Smokeless tobacco: Never Used  . Alcohol Use: Yes     Comment: Occasionally.  . Drug Use: No  . Sexual Activity: Not on file   Other Topics Concern  . Not on file   Social History Narrative  . No narrative on file    Review of Systems: Constitutional: Negative for fever, chills, diaphoresis, activity change, appetite change and fatigue. HENT: Negative for ear pain, nosebleeds, congestion, facial swelling, rhinorrhea, neck pain, neck stiffness and ear discharge.  Eyes: Negative for pain, discharge, redness, itching and visual disturbance. Respiratory: Negative for cough, choking, chest tightness, shortness of breath, wheezing  and stridor.  Cardiovascular: Negative for chest pain, palpitations and leg swelling. Gastrointestinal: Negative for abdominal distention. Genitourinary: Negative for dysuria, urgency, frequency, hematuria, flank pain, decreased urine volume, difficulty urinating and dyspareunia.  Musculoskeletal: Negative for back pain, joint swelling, arthralgia and gait problem. Neurological: Negative for dizziness, tremors, seizures, syncope, facial asymmetry, speech difficulty, weakness, light-headedness, numbness and headaches.  Hematological: Negative for adenopathy. Does not bruise/bleed easily. Psychiatric/Behavioral: Negative for hallucinations, behavioral  problems, confusion, dysphoric mood, decreased concentration and agitation.    Objective:   Filed Vitals:   07/29/14 1204  BP: 121/76  Pulse: 82  Temp: 98.7 F (37.1 C)  Resp: 16    Physical Exam: Constitutional: Patient appears well-developed and well-nourished. No distress. HENT: Normocephalic, atraumatic, External right and left ear normal. Oropharynx is clear and moist.  Eyes: Conjunctivae and EOM are normal. PERRLA, no scleral icterus. Neck: Normal ROM. Neck supple. No JVD. No tracheal deviation. No thyromegaly. CVS: RRR, S1/S2 +, no murmurs, no gallops, no carotid bruit.  Pulmonary: Effort and breath sounds normal, no stridor, rhonchi, wheezes, rales.  Abdominal: Soft. BS +, no distension, tenderness, rebound or guarding.  Musculoskeletal: Normal range of motion. No edema and no tenderness.  Neuro: Alert. Normal reflexes, muscle tone coordination. No cranial nerve deficit. Skin: Skin is warm and dry. No rash noted. Not diaphoretic. No erythema. No pallor. Psychiatric: Normal mood and affect. Behavior, judgment, thought content normal.  Lab Results  Component Value Date   WBC 5.0 07/18/2014   HGB 11.2* 07/18/2014   HCT 32.6* 07/18/2014   MCV 93.9 07/18/2014   PLT 147* 07/18/2014   Lab Results  Component Value Date   CREATININE 0.89 07/17/2014   BUN 8 07/17/2014   NA 142 07/17/2014   K 4.1 07/17/2014   CL 108 07/17/2014   CO2 22 07/17/2014    Lab Results  Component Value Date   HGBA1C 5.5 07/17/2014   Lipid Panel     Component Value Date/Time   CHOL 104 07/17/2014 0355   TRIG 54 07/17/2014 0355   HDL 53 07/17/2014 0355   CHOLHDL 2.0 07/17/2014 0355   VLDL 11 07/17/2014 0355   LDLCALC 40 07/17/2014 0355       Assessment and plan:   1. Embolic stroke Patient is currently on aspirin and statin already scheduled to follow with the neurologist as well as she is scheduled to see a rheumatologist for further evaluation.  2. Anemia, unspecified Borderline low hemoglobin I  have advised patient to take iron supplements daily.  3. Other and unspecified hyperlipidemia Currently patient is on Lipitor 10 mg, will repeat fasting lipid panel and LFTs in 3 months  4. Screening  - COMPLETE METABOLIC PANEL WITH GFR - Vit D  25 hydroxy (rtn osteoporosis monitoring)        Health Maintenance  -Pap Smear: Up-to-date with Pap smear.  Return in about 3 months (around 10/29/2014) for hyperipidemia, stroke.    [name] Cheadle, MD

## 2014-07-29 NOTE — Progress Notes (Signed)
Patient here for hospital follow up Recently suffered a stroke Has since stopped smoking and is off her birth control

## 2014-07-29 NOTE — Progress Notes (Deleted)
Patient Demographics  Cristina [name], is a 28 y.o. female  ZOX:[id number]  [id number]:[id number]  DOB - 12/06/1986  CC:  Chief Complaint  Patient presents with  . Follow-up    stroke       HPI: Cristina [name] is a 28 y.o. female here today to establish medical care. Patient has No headache, No chest pain, No abdominal pain - No Nausea, No new weakness tingling or numbness, No Cough - SOB.  No Known Allergies Past Medical History  Diagnosis Date  . Stroke 06/2014  . Endocarditis of mitral valve     libman sachs?   Current Outpatient Prescriptions on File Prior to Visit  Medication Sig Dispense Refill  . aspirin EC 325 MG tablet Take 1 tablet (325 mg total) by mouth daily.  30 tablet  0  . atorvastatin (LIPITOR) 10 MG tablet Take 1 tablet (10 mg total) by mouth daily at 6 PM.  30 tablet  0  . loratadine (CLARITIN) 10 MG tablet Take 10 mg by mouth daily.      . nicotine (NICODERM CQ - DOSED IN MG/24 HOURS) 21 mg/24hr patch Place 1 patch (21 mg total) onto the skin daily.  28 patch  0  . Norgestimate-Ethinyl Estradiol Triphasic (ORTHO TRI-CYCLEN LO) 0.18/0.215/0.25 MG-25 MCG tab Take 1 tablet by mouth daily.       No current facility-administered medications on file prior to visit.   Family History  Problem Relation Age of Onset  . CAD Mother   . Heart disease Mother   . Stroke Other     uncle 7050s, grandfather 5460s  . Stroke Paternal Grandfather   . Cancer Paternal Grandfather     lung cancer    History   Social History  . Marital Status: Single    Spouse Name: N/A    Number of Children: N/A  . Years of Education: N/A   Occupational History  . Not on file.   Social History Main Topics  . Smoking status: Former Smoker -- 1.50 packs/day for 10 years    Quit date: 07/16/2014  . Smokeless tobacco: Never Used  . Alcohol Use: Yes     Comment: Occasionally.  . Drug Use: No  . Sexual Activity: Not on file   Other Topics Concern  . Not on file   Social History  Narrative  . No narrative on file    Review of Systems: Constitutional: Negative for fever, chills, diaphoresis, activity change, appetite change and fatigue. HENT: Negative for ear pain, nosebleeds, congestion, facial swelling, rhinorrhea, neck pain, neck stiffness and ear discharge.  Eyes: Negative for pain, discharge, redness, itching and visual disturbance. Respiratory: Negative for cough, choking, chest tightness, shortness of breath, wheezing and stridor.  Cardiovascular: Negative for chest pain, palpitations and leg swelling. Gastrointestinal: Negative for abdominal distention. Genitourinary: Negative for dysuria, urgency, frequency, hematuria, flank pain, decreased urine volume, difficulty urinating and dyspareunia.  Musculoskeletal: Negative for back pain, joint swelling, arthralgia and gait problem. Neurological: Negative for dizziness, tremors, seizures, syncope, facial asymmetry, speech difficulty, weakness, light-headedness, numbness and headaches.  Hematological: Negative for adenopathy. Does not bruise/bleed easily. Psychiatric/Behavioral: Negative for hallucinations, behavioral problems, confusion, dysphoric mood, decreased concentration and agitation.    Objective:   Filed Vitals:   07/29/14 1204  BP: 121/76  Pulse: 82  Temp: 98.7 F (37.1 C)  Resp: 16    Physical Exam: Constitutional: Patient appears well-developed and well-nourished. No distress. HENT: Normocephalic, atraumatic, External right and left ear normal. Oropharynx  is clear and moist.  Eyes: Conjunctivae and EOM are normal. PERRLA, no scleral icterus. Neck: Normal ROM. Neck supple. No JVD. No tracheal deviation. No thyromegaly. CVS: RRR, S1/S2 +, no murmurs, no gallops, no carotid bruit.  Pulmonary: Effort and breath sounds normal, no stridor, rhonchi, wheezes, rales.  Abdominal: Soft. BS +, no distension, tenderness, rebound or guarding.  Musculoskeletal: Normal range of motion. No edema and no  tenderness.  Lymphadenopathy: No lymphadenopathy noted, cervical, inguinal or axillary Neuro: Alert. Normal reflexes, muscle tone coordination. No cranial nerve deficit. Skin: Skin is warm and dry. No rash noted. Not diaphoretic. No erythema. No pallor. Psychiatric: Normal mood and affect. Behavior, judgment, thought content normal.  Lab Results  Component Value Date   WBC 5.0 07/18/2014   HGB 11.2* 07/18/2014   HCT 32.6* 07/18/2014   MCV 93.9 07/18/2014   PLT 147* 07/18/2014   Lab Results  Component Value Date   CREATININE 0.89 07/17/2014   BUN 8 07/17/2014   NA 142 07/17/2014   K 4.1 07/17/2014   CL 108 07/17/2014   CO2 22 07/17/2014    Lab Results  Component Value Date   HGBA1C 5.5 07/17/2014   Lipid Panel     Component Value Date/Time   CHOL 104 07/17/2014 0355   TRIG 54 07/17/2014 0355   HDL 53 07/17/2014 0355   CHOLHDL 2.0 07/17/2014 0355   VLDL 11 07/17/2014 0355   LDLCALC 40 07/17/2014 0355       Assessment and plan:   1. Anemia, unspecified ***  2. Other and unspecified hyperlipidemia ***  3. Screening *** - COMPLETE METABOLIC PANEL WITH GFR - Vit D  25 hydroxy (rtn osteoporosis monitoring)  4. Embolic stroke ***        Health Maintenance -Colonoscopy: -Pap Smear: -Mammogram: -Vaccinations:  -TdAP  -PNA (PPSV23) (one dose after 65) (or one dose before 65 if chronic conditions)  -Zoster (1 dose after 60 yrs)  -Influenza  Return in about 3 months (around 10/29/2014) for hyperipidemia, stroke.    The patient was given clear instructions to go to ER or return to medical center if symptoms don't improve, worsen or new problems develop. The patient verbalized understanding. The patient was told to call to get lab results if they haven't heard anything in the next week.     [name] Cheadle, MD

## 2014-07-29 NOTE — Telephone Encounter (Signed)
Called and left patient a voice mail to return my call regarding her appointment at [facility] clinic with Dr. Cyndie ChimeGranfortuna for hematology. The appointment is 08/12/14 at 2:00 pm and she needs to bring $25.00 for the visit. [name]

## 2014-07-29 NOTE — Telephone Encounter (Signed)
Notified patient of appointment date, time, and copay. Cristina [name]

## 2014-07-30 ENCOUNTER — Telehealth: Payer: Self-pay

## 2014-07-30 LAB — VITAMIN D 25 HYDROXY (VIT D DEFICIENCY, FRACTURES): Vit D, 25-Hydroxy: 54 ng/mL (ref 30–89)

## 2014-07-30 NOTE — Telephone Encounter (Signed)
Message copied by Lestine MountJUAREZ, Francena Zender L on Wed Jul 30, 2014 10:58 AM ------      Message from: [name] CheadleADVANI, DEEPAK      Created: Wed Jul 30, 2014  9:36 AM       Call and let the Patient know that blood work is normal.       ------

## 2014-07-30 NOTE — Telephone Encounter (Signed)
Patient is aware of her lab results 

## 2014-07-31 ENCOUNTER — Ambulatory Visit: Payer: Medicaid Other | Admitting: Occupational Therapy

## 2014-07-31 DIAGNOSIS — IMO0001 Reserved for inherently not codable concepts without codable children: Secondary | ICD-10-CM | POA: Diagnosis not present

## 2014-08-04 ENCOUNTER — Inpatient Hospital Stay: Payer: Self-pay | Admitting: Internal Medicine

## 2014-08-12 ENCOUNTER — Encounter: Payer: Self-pay | Admitting: Oncology

## 2014-08-12 ENCOUNTER — Ambulatory Visit (INDEPENDENT_AMBULATORY_CARE_PROVIDER_SITE_OTHER): Payer: Medicaid Other | Admitting: Oncology

## 2014-08-12 VITALS — BP 103/70 | HR 101 | Temp 97.6°F | Ht 64.0 in | Wt 106.5 lb

## 2014-08-12 DIAGNOSIS — I63311 Cerebral infarction due to thrombosis of right middle cerebral artery: Secondary | ICD-10-CM

## 2014-08-12 DIAGNOSIS — D6851 Activated protein C resistance: Secondary | ICD-10-CM

## 2014-08-12 DIAGNOSIS — I6330 Cerebral infarction due to thrombosis of unspecified cerebral artery: Secondary | ICD-10-CM

## 2014-08-12 DIAGNOSIS — D6859 Other primary thrombophilia: Secondary | ICD-10-CM

## 2014-08-12 HISTORY — DX: Activated protein C resistance: D68.51

## 2014-08-12 NOTE — Patient Instructions (Signed)
To lab today We will call you with results when available Return to see blood specialist as needed  You have a mutation in clotting factor #5. (Factor V Leiden) This is a minor risk factor for clotting in most people but can interact with other common risk factors to increase clotting risk. You should avoid estrogen containing contraceptives. You should advise you doctors about this if you need to have any surgery or if you get pregnant.

## 2014-08-12 NOTE — Progress Notes (Signed)
Patient ID: Cristina LymeJennifer L [name], female   DOB: 12/31/1985, 28 y.o.   MRN: [id number] New Patient Hematology   Cristina LymeJennifer L [name] [id number] 01/30/1986 28 y.o. 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 She had migraine headaches when she was a teenager but they have subsided over time. At present she only gets a rare occasional headache. She did have a headache the night before she developed additional neurologic symptoms. She took Tylenol and went to bed. She went out the next evening for dinner. When she returned home with her friends she suddenly had a episode of expressive aphasia and then noted left facial weakness. She then developed left upper extremity paresthesias and weakness. She thought she might be having a stroke. Her friend is a Engineer, civil (consulting)nurse and agreed and got her to the hospital where she was admitted on 07/16/2014. Her neurologic deficits resolved rapidly and thrombolytic therapy was not given. A CT scan of the brain was unremarkable but an MRI initially done without contrast on July 22 showed numerous punctate acute infarctions in the right posterior temporal and parietal region consistent with microembolic infarctions in the territory of the right middle cerebral artery. Largest 3 mm. No cerebral edema or hemorrhage. No mass lesions. The scan was repeated the next day with contrast and read as progression of right MCA infarcts now with confluent involvement in the operculum, right parietal lobe, and scattered cortical diffusion restriction in the superior peri-Rolandic and premotor areas. Transcranial Doppler bubble study was negative. Transthoracic echocardiogram showed a normal ejection fraction 55-60%, normal wall motion, no evidence for a patent foramen ovale or atrial septal defect and no mural thrombi. A  transesophageal echocardiogram suggested a small mobile vegetation on the anterior leaflet of the mitral valve. Lower extremity venous Doppler studies showed no clot. She had no signs or symptoms of infection. She did have a formal infectious disease consultation. At the end of the day, it was not felt that this was an infectious process and antibiotic therapy was not recommended.  Multiple laboratory testing was done to exclude infectious in vasculitis as etiologies of her stroke. Initial ANA weakly positive repeat negative. Sedimentation rate 11 mm. Rheumatoid factor negative. Lupus anticoagulant anticardiolipin antibodies antibodies to beta 2 glycoprotein 1 all negative. She was found to be a heterozygote for the factor V Leiden gene mutation. Prothrombin gene mutation not detected. Normal plasma homocysteine. Normal protein C antigenic and functional activities. Borderline decrease total protein S 58% of control (60-150), and borderline decrease protein S activity 61% (69-129). Antithrombin normal. HIV serology negative. Sjogren's serology negative. Marland Kitchen. ANCA negative. She has a mutation in MTFHR gene which is irrelevant.  She was started on aspirin and Lipitor. Of note her cholesterol and triglycerides were perfectly normal.  She has no signs or symptoms of collagen vascular disease although she does admit to cold extremities which  sometimes appear blue and she has had at least one episode where one of the fingers on her right hand blanched and became white and painful consistent with a Raynaud's phenomenon.  Of note she had just started back on estrogen containing oral contraceptives Ortho Tri-Cyclen about 7 months prior to this event. She was also smoking 1-1/2 packs of cigarettes daily.  There is a family history of early strokes in a paternal grandfather who had a  stroke in his 2s and a maternal uncle who had a stroke in his 78s. A maternal grandmother and a paternal grandmother died of  myocardial infarctions. She has a half brother age 15 who is healthy.   PMH: No history of MI, hypertension, heart murmur, rheumatic fever, stomach ulcers, diabetes, asthma, emphysema, hepatitis, yellow jaundice, mononucleosis, thyroid trouble, inflammatory arthritis, or seizure. Past Medical History  Diagnosis Date  . Stroke 06/2014  . Endocarditis of mitral valve     libman sachs?  . Heterozygous factor V Leiden mutation 08/12/2014    07/17/14    Past Surgical History  Procedure Laterality Date  . Right ear surgery    . Tee without cardioversion N/A 07/18/2014    Procedure: TRANSESOPHAGEAL ECHOCARDIOGRAM (TEE);  Surgeon: Chrystie Nose, MD;  Location: [facility] ENDOSCOPY;  Service: Cardiovascular;  Laterality: N/A;    Allergies: No Known Allergies  Medications: Current outpatient prescriptions:aspirin EC 325 MG tablet, Take 1 tablet (325 mg total) by mouth daily., Disp: 30 tablet, Rfl: 0;  atorvastatin (LIPITOR) 10 MG tablet, Take 1 tablet (10 mg total) by mouth daily at 6 PM., Disp: 30 tablet, Rfl: 0;  loratadine (CLARITIN) 10 MG tablet, Take 10 mg by mouth daily., Disp: , Rfl:  nicotine (NICODERM CQ - DOSED IN MG/24 HOURS) 21 mg/24hr patch, Place 1 patch (21 mg total) onto the skin daily., Disp: 28 patch, Rfl: 0;  Norgestimate-Ethinyl Estradiol Triphasic (ORTHO TRI-CYCLEN LO) 0.18/0.215/0.25 MG-25 MCG tab, Take 1 tablet by mouth daily., Disp: , Rfl:    Social History:   reports that she quit smoking about 3 weeks ago. She has never used smokeless tobacco. She reports that she drinks alcohol. She reports that she does not use illicit drugs.  Family History: Family History  Problem Relation Age of Onset  . CAD Mother   . Heart disease Mother   . Stroke Other     uncle 42s, grandfather 13s  . Stroke Paternal Grandfather   . Cancer Paternal Grandfather     lung cancer     Review of Systems: See history of present illness Remaining ROS negative.  Physical Exam: Blood pressure  103/70, pulse 101, temperature 97.6 F (36.4 C), temperature source Oral, height 5\' 4"  (1.626 m), weight 106 lb 8 oz (48.308 kg), last menstrual period 07/18/2014, SpO2 99.00%. Wt Readings from Last 3 Encounters:  08/12/14 106 lb 8 oz (48.308 kg)  07/29/14 104 lb 9.6 oz (47.446 kg)  07/25/14 102 lb (46.267 kg)     General appearance: Petite well-nourished Caucasian woman HENNT: Pharynx no erythema, exudate, mass, or ulcer. No thyromegaly or thyroid nodules Lymph nodes: No cervical, supraclavicular, or axillary lymphadenopathy Breasts:  Lungs: Clear to auscultation, resonant to percussion throughout Heart: Regular rhythm, no murmur, no gallop, no rub, no click, no edema Abdomen: Soft, nontender, normal bowel sounds, no mass, no organomegaly Extremities: No edema, no calf tenderness. There is cyanosis of the fingertips and the toes. Musculoskeletal: no joint deformities GU:  Vascular: Radial pulses 2+ symmetric. Ulnar pulses absent symmetric. Carotid pulses 2+, no bruits, distal pulses: Dorsalis pedis and posterior tibial pulses not palpable. Neurologic: Alert, oriented, PERRLA, optic discs sharp and vessels normal, no hemorrhage or exudate, cranial nerves grossly normal, motor strength 5 over 5, reflexes 1+ symmetric, upper body coordination normal, gait normal, vibration sensation mildly decreased over the fingertips bilaterally. Skin: No rash or ecchymosis    Lab Results: Lab Results  Component Value Date   WBC 5.0 07/18/2014   HGB 11.2*  07/18/2014   HCT 32.6* 07/18/2014   MCV 93.9 07/18/2014   PLT 147* 07/18/2014     Chemistry      Component Value Date/Time   NA 139 07/29/2014 1303   K 4.3 07/29/2014 1303   CL 101 07/29/2014 1303   CO2 30 07/29/2014 1303   BUN 13 07/29/2014 1303   CREATININE 1.05 07/29/2014 1303   CREATININE 0.89 07/17/2014 0355      Component Value Date/Time   CALCIUM 9.6 07/29/2014 1303   ALKPHOS 47 07/29/2014 1303   AST 22 07/29/2014 1303   ALT 14 07/29/2014 1303    BILITOT 0.8 07/29/2014 1303    CBC at time of admission to the hospital on July 22 with hemoglobin 13, hematocrit 37, MCV 93, white count 6600, 41% neutrophils, 50% lymphocytes, 7% monocytes, platelets 176,000. Repeat differential the next day with 58% neutrophils and 34% lymphocytes. Baseline pro time 12.6 seconds, PTT 30 seconds    Radiological Studies: See discussion above     Impression and Plan: #1. Idiopathic stroke felt to be  embolic with no clear obvious source. Question vegetation versus fibroelastoma, anterior leaflet mitral valve. No ASD or PFO. Negative collagen vascular evaluation. Negative lupus anticoagulant/anticardiolipin antibody evaluation.  The factor V Leiden gene mutation is a mild risk factor for almost exclusively venous and not arterial thrombotic events. It is very common occurring in 5% of all Caucasians. I do not believe that this was in any way related to her stroke. There is a very strong family history of strokes at early age. This may be some form of a familial vasculopathy. Additional risk factors at time of this event included estrogen containing oral contraceptives and heavy cigarette smoking. These risk factors also tend to be more commonly associated with venous rather than arterial thrombi.  She does have some distal cyanosis on exam today and gives a history suggestive of Raynaud's. I am going to go ahead and check a serum protein electrophoresis, immunoelectrophoresis, and cryoglobulin screen. I do not think that she should be put on any other anticoagulant other than the antiplatelet agent that she is currently on.      Levert Feinstein, MD 08/12/2014, 5:47 PM

## 2014-08-12 NOTE — Progress Notes (Signed)
Patient ID: Cristina [name], female   DOB: 10/06/1986, 28 y.o.   MRN: [id number] Addendum to impression and plan: I did educate and advise the patient with respect to the significance of the factor V Leiden gene mutation. She should avoid estrogen containing contraceptives. She has a retroverted uterus and therefore a IUD is not a good choice for her. She should use a low-dose progestational agent.

## 2014-08-13 ENCOUNTER — Ambulatory Visit: Payer: Medicaid Other | Admitting: Occupational Therapy

## 2014-08-13 DIAGNOSIS — IMO0001 Reserved for inherently not codable concepts without codable children: Secondary | ICD-10-CM | POA: Diagnosis not present

## 2014-08-13 LAB — IGG, IGA, IGM
IGG (IMMUNOGLOBIN G), SERUM: 1570 mg/dL (ref 690–1700)
IgA: 268 mg/dL (ref 69–380)
IgM, Serum: 98 mg/dL (ref 52–322)

## 2014-08-14 LAB — IMMUNOFIXATION ELECTROPHORESIS
IgA: 268 mg/dL (ref 69–380)
IgG (Immunoglobin G), Serum: 1570 mg/dL (ref 690–1700)
IgM, Serum: 98 mg/dL (ref 52–322)
Total Protein, Serum Electrophoresis: 7.1 g/dL (ref 6.0–8.3)

## 2014-08-15 ENCOUNTER — Ambulatory Visit: Payer: Medicaid Other | Admitting: Occupational Therapy

## 2014-08-15 DIAGNOSIS — IMO0001 Reserved for inherently not codable concepts without codable children: Secondary | ICD-10-CM | POA: Diagnosis not present

## 2014-08-18 LAB — COLD AGGLUTININ TITER

## 2014-08-20 ENCOUNTER — Ambulatory Visit: Payer: Medicaid Other | Admitting: Occupational Therapy

## 2014-08-20 DIAGNOSIS — IMO0001 Reserved for inherently not codable concepts without codable children: Secondary | ICD-10-CM | POA: Diagnosis not present

## 2014-08-21 ENCOUNTER — Encounter: Payer: Self-pay | Admitting: Neurology

## 2014-08-21 ENCOUNTER — Ambulatory Visit (INDEPENDENT_AMBULATORY_CARE_PROVIDER_SITE_OTHER): Payer: Self-pay | Admitting: Neurology

## 2014-08-21 VITALS — BP 109/66 | HR 65 | Ht 63.0 in | Wt 109.0 lb

## 2014-08-21 DIAGNOSIS — R894 Abnormal immunological findings in specimens from other organs, systems and tissues: Secondary | ICD-10-CM

## 2014-08-21 DIAGNOSIS — M3211 Endocarditis in systemic lupus erythematosus: Secondary | ICD-10-CM | POA: Insufficient documentation

## 2014-08-21 DIAGNOSIS — R768 Other specified abnormal immunological findings in serum: Secondary | ICD-10-CM | POA: Insufficient documentation

## 2014-08-21 DIAGNOSIS — F17200 Nicotine dependence, unspecified, uncomplicated: Secondary | ICD-10-CM | POA: Insufficient documentation

## 2014-08-21 DIAGNOSIS — I63311 Cerebral infarction due to thrombosis of right middle cerebral artery: Secondary | ICD-10-CM

## 2014-08-21 DIAGNOSIS — I6330 Cerebral infarction due to thrombosis of unspecified cerebral artery: Secondary | ICD-10-CM

## 2014-08-21 DIAGNOSIS — I39 Endocarditis and heart valve disorders in diseases classified elsewhere: Secondary | ICD-10-CM

## 2014-08-21 DIAGNOSIS — M329 Systemic lupus erythematosus, unspecified: Secondary | ICD-10-CM

## 2014-08-21 MED ORDER — ATORVASTATIN CALCIUM 10 MG PO TABS: 10.0000 mg | ORAL_TABLET | Freq: Every day | ORAL | Status: DC

## 2014-08-21 MED ORDER — ASPIRIN EC 325 MG PO TBEC: 325.0000 mg | DELAYED_RELEASE_TABLET | Freq: Every day | ORAL | Status: DC

## 2014-08-21 NOTE — Patient Instructions (Signed)
-   will refer you to the rheumatologist for evaluation of autoimmune disease - continue ASA and lipitor for stroke prevention - follow up with PCP for stroke risk factor modification - avoid OCP - quit smoking - able to back to work - follow up in 2 months.

## 2014-08-21 NOTE — Progress Notes (Signed)
STROKE NEUROLOGY FOLLOW UP NOTE  NAME: Cristina [name] DOB: 1986/03/18  REASON FOR VISIT: stroke follow up HISTORY FROM: pt and chart  Today we had the pleasure of seeing Cristina [name] in follow-up at our Neurology Clinic. Pt was accompanied by no one.   History Summary Cristina [name] is a 28 y.o. female presenting with left arm weakness and facial droop. Initial MRI showed right MCA scattered embolic infarcts, and then repeat MRI showed bilateral MCA embolic stroke, consistent with cardioembolic. Patient does have a history of cigarette smoking, OCP use and migraines. She does report a headache the day prior to admission. However, further extensive work up showed no DVT, no PFO, and negative CTA and CUS. 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 ID consulted but culture negative and other work up also negative. No Abx started and infectious endocarditis felt not likely. Her symptoms improved a lot and she was discharged with ASA 325 and low dose lipitor for stroke prevention.  Interval History During the interval time, the patient has been doing well. She had no recurrent strokes and her left arm and hand function much improved. She went to see ID and was discharged from their clinic and she was referred to see hematologist Dr. 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 Requesting to go back to work.    REVIEW OF SYSTEMS: Full 14 system review of systems performed and notable only for those listed below and in HPI above, all others are negative:  Constitutional: fatigue  Cardiovascular: N/A    Ear/Nose/Throat: N/A  Skin: N/A  Eyes: N/A  Respiratory: N/A  Gastroitestinal: constipation  Genitourinary: N/A Hematology/Lymphatic: N/A  Endocrine: N/A  Musculoskeletal: N/A  Allergy/Immunology: N/A  Neurological: numbness  Psychiatric: N/A  The following represents the patient's updated allergies and side effects list: No Known Allergies  Labs since last visit of relevance include the following: Results for orders placed in visit on 08/12/14  IGG, IGA, IGM      Result Value Ref Range   IgG (Immunoglobin G), Serum 1570  690 - 1700 mg/dL   IgA 409  69 - 811 mg/dL   IgM, Serum 98  52 - 914 mg/dL  IMMUNOFIXATION ELECTROPHORESIS      Result Value Ref Range   Total Protein, Serum Electrophoresis 7.1  6.0 - 8.3 g/dL   IgG (Immunoglobin G), Serum 1570  690 - 1700 mg/dL   IgA 782  69 - 956 mg/dL   IgM, Serum 98  52 - 213 mg/dL   Immunofix Electr Int SEE NOTE    COLD AGGLUTININ TITER      Result Value Ref Range   Cold Agglutinin Titer REPORT      The neurologically relevant items on the patient's problem list were reviewed on today's visit.  Neurologic Examination  A problem focused neurological exam (12 or more points of the single system neurologic examination, vital signs counts as 1 point, cranial nerves count for 8 points) was performed.  Blood pressure 109/66, pulse 65, height  (1.6 m), weight 109 lb (49.442 kg).  General - Well nourished, well developed, in no apparent distress.  Ophthalmologic -  Sharp disc margins OU.  Cardiovascular - Regular rate and rhythm with no murmur.  Mental Status -  Level of arousal and orientation to time, place, and person were intact. Language including expression, naming, repetition, comprehension, reading, and writing was assessed and found intact. Attention span and concentration were normal. Recent and remote memory were intact. Fund of Knowledge was assessed and was intact.  Cranial Nerves II - XII - II - Visual field  intact OU. III, IV, VI - Extraocular movements intact. V - Facial sensation intact bilaterally. VII - Facial movement intact bilaterally. VIII - Hearing & vestibular intact bilaterally. X - Palate elevates symmetrically. XI - Chin turning & shoulder shrug intact bilaterally. XII - Tongue protrusion intact.  Motor Strength - The patient's strength was normal in all extremities and pronator drift was absent.  Bulk was normal and fasciculations were absent.   Motor Tone - Muscle tone was assessed at the neck and appendages and was normal.  Reflexes - The patient's reflexes were normal in all extremities and she had no pathological reflexes.  Sensory - Light touch, temperature/pinprick, vibration and proprioception, and Romberg testing were assessed and were normal.    Coordination - The patient had normal movements in the hands and feet with no ataxia or dysmetria.  Tremor was absent.  Gait and Station - The patient's transfers, posture, gait, station, and turns were observed as normal.  Data reviewed: I personally reviewed the images and agree with the radiology interpretations.  CT Angio Head 07/16/2014 Intermittent occlusion of right middle cerebral artery insular branch, which could reflect thromboembolic disease or possible vasculopathy. In addition, relative asymmetric, decreased distal right anterior cerebral artery vessels, equivocal for clinically significant finding, could reflect normal variant. Findings may be better characterized on MRI of the head with and without contrast including gradient sequences. Right parietal transcortical encephalomalacia may reflect remote infarct or possibly head trauma.   CT Angio Neck 07/16/2014 Normal CT angiogram of the neck.   CT of the brain 07/16/2014 Negative CT head.   MRI of the brain without contrast 07/16/2014 Numerous punctate acute infarctions in the right posterior temporal and parietal region consistent with micro embolic infarctions in  the right middle cerebral artery territory.   MRI of the brain  07/17/2014  1. Interval progression of right MCA infarcts. Right operculum and parietal lobe confluent [name] affected now. Small cortical infarcts in the peri-Rolandic region.  2. New left superior frontal gyrus, left ACA - left MCA watershed type, infarct. Small right occipital lacunar infarcts, possibly also  in the posterior left temporal lobe.  3. The constellation of recent CTA and MRI data suggests a proximal aortic or cardiac source of emboli.  4. No mass effect or hemorrhage. No abnormal enhancement.  5. Major vascular flow voids appear stable to the recent CTA, including evidence of abnormal flow in the right MCA dominant  posterior sylvian branch.   Lower extremity Dopplers  07/17/2014  No evidence of deep vein or superficial thrombosis involving the right lower extremity and left lower extremity. - . Incidental findings are consistent with: Baker's Cyst on the left. - No evidence of Baker&'s cyst on the right.   2D Echocardiogram - ejection fraction 55-60%. No cardiac source of emboli identified.   CXR - No active cardiopulmonary disease.   EKG - sinus tachycardia. For complete results please see formal report.   TCD with bubble  07/17/2014  Impression: negative Transcranial Doppler Bubble Study indicative of no right to left shunt.  Hypercoagulable Panel  ANA - positive x 2 dsDNA - 5 - indeterminate Anti-smith - neg Rheumatoid factor - < 10  ANCA screen - negative  Sojourn syndrome A and B - negative  C3 complement - 88 low  C4 complement - 18 within normal limits  HIV - nonreactive  Antithrombin III - 85 within normal limits  Serum homocystine - 11.7 within normal limits  RPR - nonreactive  Sedimentation rate - 11 within normal limits  Antinuclear body titer - negative  Total complement - 52, normal  Protein C activity - 114 within normal limits  Protein C total - 76 low normal  Protein S.  activity - 61 slightly low  Protein S. 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 Small mobile vegetation on the tip of the anterior mitral leaflet - this could represent endocarditis or Libman-Sacks endocarditis.  2. No LAA thrombus  3. No PFO or atrial septal defect  4. Normal LV function and wall motion  LDL 40 and A1C 5.5  Assessment: As you may recall, she is a 28 y.o. Caucasian female with PMH of migraine, OCP use and smoker admitted one month ago due to bilaterally MCA stroke due to cardioemboli. Extensive work up done as listed above, but most likely etiology is related to the small mobile vegetation in MV found on TEE. ID ruled out infectious endocarditis. 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 Will need rheumatology consult.  Plan:  - rheumatology consult for cardioembolic stroke with concerns of libman-Sacks endocarditis, positive ANA, indeterminate dsDNA as well as evidence of reynold's syndrome. - continue ASA and low dose of lipitor, may consider to take off lipitor if etiology clearer - stop OCP and smoking - agree with back to work - RTC in 2 months.  Orders Placed This Encounter  Procedures  . Ambulatory referral to Rheumatology    Referral Priority:  Routine    Referral Type:  Consultation    Referral Reason:  Specialty Services Required    Requested Specialty:  Rheumatology    Number of Visits Requested:  1    Patient Instructions  - will refer you to the rheumatologist for evaluation of autoimmune disease - continue ASA and lipitor for stroke prevention - follow up with PCP for stroke  risk factor modification - avoid OCP - quit smoking - able to back to work - follow up in 2 months.   Marvel Plan, MD PhD [facility] [address], [address] [place], [place] [address] [phone]

## 2014-08-22 ENCOUNTER — Ambulatory Visit: Payer: Medicaid Other | Admitting: Occupational Therapy

## 2014-08-22 DIAGNOSIS — IMO0001 Reserved for inherently not codable concepts without codable children: Secondary | ICD-10-CM | POA: Diagnosis not present

## 2014-08-25 ENCOUNTER — Encounter: Payer: Self-pay | Admitting: Occupational Therapy

## 2014-08-27 ENCOUNTER — Encounter: Payer: Self-pay | Admitting: Occupational Therapy

## 2014-09-03 ENCOUNTER — Encounter: Payer: Self-pay | Admitting: Occupational Therapy

## 2014-09-05 ENCOUNTER — Encounter: Payer: Self-pay | Admitting: Occupational Therapy

## 2014-09-10 ENCOUNTER — Encounter: Payer: Self-pay | Admitting: Occupational Therapy

## 2014-09-12 ENCOUNTER — Encounter: Payer: Self-pay | Admitting: Occupational Therapy

## 2014-09-16 ENCOUNTER — Telehealth: Payer: Self-pay | Admitting: Neurology

## 2014-09-16 NOTE — Telephone Encounter (Signed)
Patient called and stated that [facility] needed a NPI  For Dr. Roda Shutters.  I called [place] office and they didn't have her there. I advised her  i would call them first thing in the morning and give them that information.

## 2014-09-16 NOTE — Telephone Encounter (Signed)
Patient stated referral to [facility] Rheumatologist requesting NPI number.  Patients appointment is scheduled 10/12.

## 2014-09-17 NOTE — Telephone Encounter (Signed)
I called [facility] and they stated they could not tell what insurance she had. I gave them her medicaid auth paper number and it does not say [place] access.

## 2014-09-18 ENCOUNTER — Telehealth: Payer: Self-pay | Admitting: *Deleted

## 2014-09-18 NOTE — Telephone Encounter (Signed)
Pt called/informed antibody levels are normal per Dr Cyndie Chime. No questions.

## 2014-09-18 NOTE — Telephone Encounter (Signed)
Message copied by Hassan Buckler on Thu Sep 18, 2014 12:13 PM ------      Message from: Levert Feinstein      Created: Wed Sep 17, 2014  7:58 PM       Call pt: lab last month shows she has normal antibody levels ------

## 2014-09-19 ENCOUNTER — Telehealth: Payer: Self-pay | Admitting: Neurology

## 2014-09-19 NOTE — Telephone Encounter (Signed)
Talked to Cristina [name] she stated the patient needed to be rescheduled, due to her doctor not being in the office that day.Cristina [name] had the wrong number and she also advised [place] that the patient now has [place] access, and needs a referral from her PCP.

## 2014-09-19 NOTE — Telephone Encounter (Signed)
Misty Stanley with [facility] is calling and has questions about the referral for patient. Please call.

## 2014-09-21 NOTE — Telephone Encounter (Signed)
Dear Dr. Orpah Cobb:  My name is Marvel Plan and I am one of the stroke neurologist in [facility]. I have seen Ms. Cristina [name] in hospital and clinic for cardioembolic stroke for the last 2 months. She was found to have mobile vegetations at the tip of mitral valve, consistent with infectious endocarditis vs. Libman-Sacks endocarditis. 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 Her ANA positive and dsDNA indeterminate and evidence of Reynold's phenomena. I would recommend rheumatology consultation. If you are agree, please refer her to a rheumatologist at your earliest convenience. Thank you very much.  Marvel Plan, MD PhD Stroke Neurology 09/21/2014 6:14 PM

## 2014-09-22 ENCOUNTER — Other Ambulatory Visit: Payer: Self-pay | Admitting: Internal Medicine

## 2014-09-22 DIAGNOSIS — R768 Other specified abnormal immunological findings in serum: Secondary | ICD-10-CM

## 2014-09-22 NOTE — Telephone Encounter (Signed)
Hi  Dr. Roda Shutters,   I have put in the referral to Rheumatology.   Sincerely,   [name] Cheadle, MD.

## 2014-10-30 ENCOUNTER — Encounter: Payer: Self-pay | Admitting: Neurology

## 2014-10-30 ENCOUNTER — Ambulatory Visit (INDEPENDENT_AMBULATORY_CARE_PROVIDER_SITE_OTHER): Payer: Medicaid Other | Admitting: Neurology

## 2014-10-30 VITALS — BP 108/77 | HR 85 | Ht 63.0 in | Wt 114.6 lb

## 2014-10-30 DIAGNOSIS — M3211 Endocarditis in systemic lupus erythematosus: Secondary | ICD-10-CM

## 2014-10-30 DIAGNOSIS — I63311 Cerebral infarction due to thrombosis of right middle cerebral artery: Secondary | ICD-10-CM

## 2014-10-30 DIAGNOSIS — R768 Other specified abnormal immunological findings in serum: Secondary | ICD-10-CM

## 2014-10-30 MED ORDER — ATORVASTATIN CALCIUM 10 MG PO TABS: 10.0000 mg | ORAL_TABLET | Freq: Every day | ORAL | Status: DC

## 2014-10-30 NOTE — Progress Notes (Signed)
[name] NEUROLOGY FOLLOW UP NOTE  NAME: Cristina [name] DOB: 11/21/1986  REASON FOR VISIT: [name] follow up HISTORY FROM: pt and chart  Today we had Cristina pleasure of seeing Cristina [name] in follow-up at our Neurology Clinic. Pt was accompanied by no one.   History Summary Cristina [name] is a 28 y.o. female presenting with left arm weakness and facial droop. Initial MRI showed right MCA scattered embolic infarcts, and then repeat MRI showed bilateral MCA embolic [name], consistent with cardioembolic. [name] does have a history of cigarette smoking, OCP use and migraines. Cristina [name] does report a headache Cristina day prior to admission. However, further extensive work up showed no DVT, no PFO, and negative CTA and CUS. But on TEE exam, it was found that Cristina [name] had mobile vegetation at her MV, concerning for infectious endocarditis or Libman-Sacks endocarditis. ID consulted but culture negative and other work up also negative. No Abx started and infectious endocarditis felt not likely. Her symptoms improved a lot and Cristina [name] was discharged with ASA 325 and low dose lipitor for [name] prevention.  Follow up 09/19/14 - Cristina [name] has been doing well. Cristina [name] had no recurrent strokes and her left arm and hand function much improved. Cristina [name] went to see ID and was discharged from their clinic and Cristina [name] was referred to see hematologist Dr. 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 Requesting to go back to work.    Interval History During Cristina interval time, Cristina [name] has been doing well most of Cristina part. However, Cristina [name] had one episode of feeling generalized weakness for 2 days without other neurological symptoms. Since [name], Cristina [name] noticed that Cristina [name]  has condition of episodic fingertip pallor and numbness, initially in Cristina left hand but now also in right hand, and recently happens daily. Cristina [name] went to rheumatology consult two weeks ago but denied due to late on Cristina appointment. Cristina [name] stated that Cristina [name] [place] going [place]ologist in [place].   REVIEW OF SYSTEMS: Full 14 system review of systems performed and notable only for those listed below and in HPI above, all others are negative:  Constitutional: fatigue  Cardiovascular: N/A  Ear/Nose/Throat: runny nose  Skin: N/A  Eyes: N/A  Respiratory: N/A  Gastroitestinal: constipation  Genitourinary: N/A Hematology/Lymphatic: N/A  Endocrine: N/A  Musculoskeletal: aching muscles  Allergy/Immunology: N/A  Neurological: numbness, memory loss, dizziness, headache, numbness Psychiatric: agitation, nervous, anxious  Cristina [name]: No Known Allergies  Labs since last visit of relevance include Cristina following: Results for orders placed or performed in visit on 08/12/14  IgG, IgA, IgM  Result Value Ref Range   IgG (Immunoglobin G), Serum 1570 690 - 1700 mg/dL   IgA 914268 69 - 782380 mg/dL   IgM, Serum 98 52 - 956322 mg/dL  Immunofixation electrophoresis  Result Value Ref Range   Total Protein, Serum Electrophoresis 7.1 6.0 - 8.3 g/dL   IgG (Immunoglobin G), Serum 1570 690 - 1700 mg/dL   IgA 213268 69 - 086380 mg/dL   IgM, Serum 98 52 - 578322 mg/dL   Immunofix Electr Int SEE NOTE   Cold Agglutinin Titer  Result Value Ref Range   Cold Agglutinin Titer REPORT     Cristina neurologically  relevant [name] on Cristina [name]'s problem [name] were reviewed on today's visit.  Neurologic Examination  A problem focused neurological exam (12 or more points of Cristina single system neurologic examination, vital signs counts as 1 point, cranial nerves count for 8 points) was performed.  Blood pressure 108/77, pulse 85, height 5\' 3"  (1.6 m), weight 114 lb 9.6 oz  (51.982 kg).  General - Well nourished, well developed, in no apparent distress.  Ophthalmologic - Sharp disc margins OU.  Cardiovascular - Regular rate and rhythm with no murmur.  Mental Status -  Level of arousal and orientation to time, place, and person were intact. Language including expression, naming, repetition, comprehension, reading, and writing was assessed and found intact. Attention span and concentration were normal. Recent and remote memory were intact. Fund of Knowledge was assessed and was intact.  Cranial Nerves II - XII - II - Visual field intact OU. III, IV, VI - Extraocular movements intact. V - Facial sensation intact bilaterally. VII - Facial movement intact bilaterally. VIII - Hearing & vestibular intact bilaterally. X - Palate elevates symmetrically. XI - Chin turning & shoulder shrug intact bilaterally. XII - Tongue protrusion intact.  Motor Strength - Cristina [name]'s strength was normal in all extremities and pronator drift was absent.  Bulk was normal and fasciculations were absent.   Motor Tone - Muscle tone was assessed at Cristina neck and appendages and was normal.  Reflexes - Cristina [name]'s reflexes were normal in all extremities and Cristina [name] had no pathological reflexes.  Sensory - Light touch, temperature/pinprick, vibration and proprioception, and Romberg testing were assessed and were normal.    Coordination - Cristina [name] had normal movements in Cristina hands and feet with no ataxia or dysmetria.  Tremor was absent.  Gait and Station - Cristina [name]'s transfers, posture, gait, station, and turns were observed as normal.  Data reviewed: I personally reviewed Cristina images and agree with Cristina radiology interpretations.  CT Angio Head 07/16/2014 Intermittent occlusion of right middle cerebral artery insular branch, which could reflect thromboembolic disease or possible vasculopathy. In addition, relative asymmetric, decreased distal right anterior cerebral artery  vessels, equivocal for clinically significant finding, could reflect normal variant. Findings may be better characterized on MRI of Cristina head with and without contrast including gradient sequences. Right parietal transcortical encephalomalacia may reflect remote infarct or possibly head trauma.   CT Angio Neck 07/16/2014 Normal CT angiogram of Cristina neck.   CT of Cristina brain 07/16/2014 Negative CT head.   MRI of Cristina brain without contrast 07/16/2014 Numerous punctate acute infarctions in Cristina right posterior temporal and parietal region consistent with micro embolic infarctions in Cristina right middle cerebral artery territory.   MRI of Cristina brain  07/17/2014  1. Interval progression of right MCA infarcts. Right operculum and parietal lobe confluent [name] affected now. Small cortical infarcts in Cristina peri-Rolandic region.  2. New left superior frontal gyrus, left ACA - left MCA watershed type, infarct. Small right occipital lacunar infarcts, possibly also  in Cristina posterior left temporal lobe.  3. Cristina constellation of recent CTA and MRI data suggests a proximal aortic or cardiac source of emboli.  4. No mass effect or hemorrhage. No abnormal enhancement.  5. Major vascular flow voids appear stable to Cristina recent CTA, including evidence of abnormal flow in Cristina right MCA dominant  posterior sylvian branch.   Lower extremity Dopplers  07/17/2014  No evidence of deep vein or superficial thrombosis involving Cristina right lower extremity and left lower extremity. - .  Incidental findings are consistent with: Baker's Cyst on Cristina left. - No evidence of Baker&'s cyst on Cristina right.   2D Echocardiogram - ejection fraction 55-60%. No cardiac source of emboli identified.   CXR - No active cardiopulmonary disease.   EKG - sinus tachycardia. For complete results please see formal report.   TCD with bubble  07/17/2014  Impression: negative Transcranial Doppler Bubble Study indicative of no right to left shunt.    Hypercoagulable Panel  ANA - positive x 2 dsDNA - 5 - indeterminate Anti-smith - neg Rheumatoid factor - < 10  ANCA screen - negative  Sojourn syndrome A and B - negative  C3 complement - 88 low  C4 complement - 18 within normal limits  HIV - nonreactive  Antithrombin III - 85 within normal limits  Serum homocystine - 11.7 within normal limits  RPR - nonreactive  Sedimentation rate - 11 within normal limits  Antinuclear body titer - negative  Total complement - 52, normal  Protein C activity - 114 within normal limits  Protein C total - 76 low normal  Protein S. activity - 61 slightly low  Protein S. Total - 58 slightly low  Lupus anticoagulant - 38.4 within normal limits  Beta-2 glycoprotein - within normal limits  Prothrombin gene mutation - neg  Cardiolipin antibodies - IgG 6; IgM 3; IgA 2 - all low  Factor V Leiden - heterozygous for Cristina Factor V Leiden (R506Q) mutation in Cristina Factor V gene   TEE  07/18/2014  1. Small mobile vegetation on Cristina tip of Cristina anterior mitral leaflet - this could represent endocarditis or Libman-Sacks endocarditis.  2. No LAA thrombus  3. No PFO or atrial septal defect  4. Normal LV function and wall motion  LDL 40 and A1C 5.5  Assessment: As you may recall, Cristina [name] is a 28 y.o. Caucasian female with PMH of migraine, OCP use and smoker admitted one month ago due to bilaterally MCA [name] due to cardioemboli. Extensive work up done as listed above, but most likely [name] is related to Cristina small mobile vegetation in MV found on TEE. ID ruled out infectious endocarditis. 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 Will need rheumatology consult. According to literature and uptodate, libman-sacks endocarditis most commonly occur in two conditions, advanced malignancy and SLE with antiphospholipid antibody syndrome, they both need life long anticoagulation. I am  not sure whether this pt needs life long anticoagulation in Cristina absence of advance cancer or APL syndrome. I am a little reluctant to put her on life long anticoagulation now since Cristina [name] is so young. Will need rheumatology input and will ask expert on libman-sacks endocarditis treatment.  Plan:  - rheumatology consult for cardioembolic [name] with concerns of libman-Sacks endocarditis, positive ANA, indeterminate dsDNA as well as evidence of reynold's syndrome. - continue ASA and low dose of lipitor, may consider to take off lipitor if [name] clearer.  - may need to consider anticoagulation if recurrent [name] - stop OCP and smoking - any [name] like symptoms, Cristina [name] knows to go to ER or call 911 for evaluation. - RTC in 2 months.  No orders of Cristina defined types were placed in this encounter.    [name] Instructions  - continue ASA and lipitor for [name] prevention - please discuss with Dr. Orpah Cobb regarding rheumatology referral - will check with expert regarding Cristina treatment option for limban-sacks endocarditis - may consider anticoagulation  if recurrent [name] for now - any [name] like symptoms, go to ER or call 911 for check out - follow up in 2 months.     Marvel PlanJindong Morocco Gipe, MD PhD [facility] [address], [address] [place], [place] [address] [phone]

## 2014-10-30 NOTE — Patient Instructions (Signed)
-   continue ASA and lipitor for stroke prevention - please discuss with Dr. 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

## 2014-11-05 ENCOUNTER — Telehealth: Payer: Self-pay | Admitting: Neurology

## 2014-11-05 DIAGNOSIS — M3211 Endocarditis in systemic lupus erythematosus: Secondary | ICD-10-CM

## 2014-11-05 DIAGNOSIS — I63311 Cerebral infarction due to thrombosis of right middle cerebral artery: Secondary | ICD-10-CM

## 2014-11-05 MED ORDER — WARFARIN SODIUM 5 MG PO TABS
5.0000 mg | ORAL_TABLET | Freq: Every day | ORAL | Status: DC
Start: 2014-11-05 — End: 2014-12-01

## 2014-11-05 NOTE — Telephone Encounter (Signed)
Talked with pt over the phone. 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 Then, we will repeat her TEE in about 3-6 months to evaluate the vegetation. Depends on the condition, we may discontinue the anticoagulation and put her back on ASA or plavix.   She will see Dr. Orpah CobbAdvani next Tuesday and I asked her to check INR when she sees Dr. Orpah CobbAdvani. Goal INR 2-3. Once INR on target, ASA can be then discontinued. I have prescribed her coumadin 5mg  daily today.   She expressed understanding and appreciation. Her rheumatology appointment has been set up.   Marvel PlanJindong Lyndsi Altic, MD PhD Stroke Neurology 11/05/2014 7:02 PM

## 2014-11-11 ENCOUNTER — Encounter: Payer: Self-pay | Admitting: Internal Medicine

## 2014-11-11 ENCOUNTER — Ambulatory Visit: Payer: Medicaid Other | Attending: Internal Medicine | Admitting: Internal Medicine

## 2014-11-11 VITALS — BP 124/82 | HR 64 | Temp 98.5°F | Resp 16 | Wt 114.8 lb

## 2014-11-11 DIAGNOSIS — Z8673 Personal history of transient ischemic attack (TIA), and cerebral infarction without residual deficits: Secondary | ICD-10-CM | POA: Insufficient documentation

## 2014-11-11 DIAGNOSIS — Z7901 Long term (current) use of anticoagulants: Secondary | ICD-10-CM | POA: Insufficient documentation

## 2014-11-11 DIAGNOSIS — Z87891 Personal history of nicotine dependence: Secondary | ICD-10-CM | POA: Diagnosis not present

## 2014-11-11 DIAGNOSIS — R768 Other specified abnormal immunological findings in serum: Secondary | ICD-10-CM

## 2014-11-11 DIAGNOSIS — D6851 Activated protein C resistance: Secondary | ICD-10-CM | POA: Diagnosis not present

## 2014-11-11 DIAGNOSIS — M3211 Endocarditis in systemic lupus erythematosus: Secondary | ICD-10-CM

## 2014-11-11 DIAGNOSIS — R760 Raised antibody titer: Secondary | ICD-10-CM | POA: Insufficient documentation

## 2014-11-11 DIAGNOSIS — I63311 Cerebral infarction due to thrombosis of right middle cerebral artery: Secondary | ICD-10-CM

## 2014-11-11 DIAGNOSIS — Z7982 Long term (current) use of aspirin: Secondary | ICD-10-CM | POA: Insufficient documentation

## 2014-11-11 NOTE — Patient Instructions (Signed)
Warfarin: What You Need to Know Warfarin is an anticoagulant. Anticoagulants help prevent the formation of blood clots. They also help stop the growth of blood clots. Warfarin is sometimes referred to as a "blood thinner."  Normally, when body tissues are cut or damaged, the blood clots in order to prevent blood loss. Sometimes clots form inside your blood vessels and obstruct the flow of blood through your circulatory system (thrombosis). These clots may travel through your bloodstream and become lodged in smaller blood vessels in your brain, which can cause a stroke, or in your lungs (pulmonary embolism). WHO SHOULD USE WARFARIN? Warfarin is prescribed for people at risk of developing harmful blood clots:  People with surgically implanted mechanical heart valves, irregular heart rhythms called atrial fibrillation, and certain clotting disorders.  People who have developed harmful blood clotting in the past, including those who have had a stroke or a pulmonary embolism, or thrombosis in their legs (deep vein thrombosis [DVT]).  People with an existing blood clot, such as a pulmonary embolism. WARFARIN DOSING Warfarin tablets come in different strengths. Each tablet strength is a different color, with the amount of warfarin (in milligrams) clearly printed on the tablet. If the color of your tablet is different than usual when you receive a new prescription, report it immediately to your pharmacist or health care provider. WARFARIN MONITORING The goal of warfarin therapy is to lessen the clotting tendency of blood but not prevent clotting completely. Your health care provider will monitor the anticoagulation effect of warfarin closely and adjust your dose as needed. For your safety, blood tests called prothrombin time (PT) or international normalized ratio (INR) are used to measure the effects of warfarin. Both of these tests can be done with a finger stick or a blood draw. The longer it takes the  blood to clot, the higher the PT or INR. Your health care provider will inform you of your "target" PT or INR range. If, at any time, your PT or INR is above the target range, there is a risk of bleeding. If your PT or INR is below the target range, there is a risk of clotting. Whether you are started on warfarin while you are in the hospital or in your health care provider's office, you will need to have your PT or INR checked within one week of starting the medicine. Initially, some people are asked to have their PT or INR checked as much as twice a week. Once you are on a stable maintenance dose, the PT or INR is checked less often, usually once every 2 to 4 weeks. The warfarin dose may be adjusted if the PT or INR is not within the target range. It is important to keep all laboratory and health care provider follow-up appointments. Not keeping appointments could result in a chronic or permanent injury, pain, or disability because warfarin is a medicine that requires close monitoring. WHAT ARE THE SIDE EFFECTS OF WARFARIN?  Too much warfarin can cause bleeding (hemorrhage) from any part of the body. This may include bleeding from the gums, blood in the urine, bloody or dark stools, a nosebleed that is not easily stopped, coughing up blood, or vomiting blood.  Too little warfarin can increase the risk of blood clots.  Too little or too much warfarin can also increase the risk of a stroke.  Warfarin use may cause a skin rash or irritation, an unusual fever, continual nausea or stomach upset, or severe pain in your joints or back.   SPECIAL PRECAUTIONS WHILE TAKING WARFARIN Warfarin should be taken exactly as directed. It is very important to take warfarin as directed since bleeding or blood clots could result in chronic or permanent injury, pain, or disability.  Take your medicine at the same time every day. If you forget to take your dose, you can take it if it is within 6 hours of when it was  due.  Do not change the dose of warfarin on your own to make up for missed or extra doses.  If you miss more than 2 doses in a row, you should contact your health care provider for advice. Avoid situations that cause bleeding. You may have a tendency to bleed more easily than usual while taking warfarin. The following actions can limit bleeding:  Using a softer toothbrush.  Flossing with waxed floss rather than unwaxed floss.  Shaving with an electric razor rather than a blade.  Limiting the use of sharp objects.  Avoiding potentially harmful activities, such as contact sports. Warfarin and Pregnancy or Breastfeeding  Warfarin is not advised during the first trimester of pregnancy due to an increased risk of birth defects. In certain situations, a woman may take warfarin after her first trimester of pregnancy. A woman who becomes pregnant or plans to become pregnant while taking warfarin should notify her health care provider immediately.  Although warfarin does not pass into breast milk, a woman who wishes to breastfeed while taking warfarin should also consult with her health care provider. Alcohol, Smoking, and Illicit Drug Use  Alcohol affects how warfarin works in the body. It is best to avoid alcoholic drinks or consume very small amounts while taking warfarin. In general, alcohol intake should be limited to 1 oz (30 mL) of liquor, 6 oz (180 mL) of wine, or 12 oz (360 mL) of beer each day. Notify your health care provider if you change your alcohol intake.  Smoking affects how warfarin works. It is best to avoid smoking while taking warfarin. Notify your health care provider if you change your smoking habits.  It is best to avoid all illicit drugs while taking warfarin since there are few studies that show how warfarin interacts with these drugs. Other Medicines and Dietary Supplements Many prescription and over-the-counter medicines can interfere with warfarin. Be sure all of your  health care providers know you are taking warfarin. Notify your health care provider who prescribed warfarin for you or your pharmacist before starting or stopping any new medicines, including over-the-counter vitamins, dietary supplements, and pain medicines. Your warfarin dose may need to be adjusted. Some common over-the-counter medicines that may increase the risk of bleeding while taking warfarin include:   Acetaminophen.  Aspirin.  Nonsteroidal anti-inflammatory medicines (NSAIDs), such as ibuprofen or naproxen.  Vitamin E. Dietary Considerations  Foods that have moderate or high amounts of vitamin K can interfere with warfarin. Avoid major changes in your diet or notify your health care provider before changing your diet. Eat a consistent amount of foods that have moderate or high amounts of vitamin K. Eating less foods containing vitamin K can increase the risk of bleeding. Eating more foods containing vitamin K can increase the risk of blood clots. Additional questions about dietary considerations can be discussed with a dietitian. Foods that are very high in vitamin K:  Greens, such as Swiss chard and beet, collard, mustard, or turnip greens (fresh or frozen, cooked).  Kale (fresh or frozen, cooked).  Parsley (raw).  Spinach (cooked). Foods that are high   in vitamin K:  Asparagus (frozen, cooked).  Beans, green (frozen, cooked).  Broccoli.  Bok choy (cooked).  Brussels sprouts (fresh or frozen, cooked).  Cabbage (cooked).   Coleslaw. Foods that are moderately high in vitamin K:  Blueberries.  Black-eyed peas.  Endive (raw).  Green leaf lettuce (raw).  Green scallions (raw).  Kale (raw).  Okra (frozen, cooked).  Plantains (fried).  Romaine lettuce (raw).  Sauerkraut (canned).  Spinach (raw). CALL YOUR CLINIC OR HEALTH CARE PROVIDER IF YOU:  Plan to have any surgery or procedure.  Feel sick, especially if you have diarrhea or  vomiting.  Experience or anticipate any major changes in your diet.  Start or stop a prescription or over-the-counter medicine.  Become, plan to become, or think you may be pregnant.  Are having heavier than usual menstrual periods.  Have had a fall, accident, or any symptoms of bleeding or unusual bruising.  Develop an unusual fever. CALL 911 IN THE [place] OR GO TO THE EMERGENCY DEPARTMENT IF YOU:   Think you may be having an allergic reaction to warfarin. The signs of an allergic reaction could include itching, rash, hives, swelling, chest tightness, or trouble breathing.  See signs of blood in your urine. The signs could include reddish, pinkish, or tea-colored urine.  See signs of blood in your stools. The signs could include bright red or black stools.  Vomit or cough up blood. In these instances, the blood could have either a bright red or a "coffee-grounds" appearance.  Have bleeding that will not stop after applying pressure for 30 minutes such as cuts, nosebleeds, or other injuries.  Have severe pain in your joints or back.  Have a new and severe headache.  Have sudden weakness or numbness of your face, arm, or leg, especially on one side of your body.  Have sudden confusion or trouble understanding.  Have sudden trouble seeing in one or both eyes.  Have sudden trouble walking, dizziness, loss of balance, or coordination.  Have trouble speaking or understanding (aphasia). Document Released: 12/12/2005 Document Revised: 04/28/2014 Document Reviewed: 06/07/2013 Exit[employer] Patient Information 2015 [employer]. This information is not intended to replace advice given to you by your health care provider. Make sure you discuss any questions you have with your health care provider.  

## 2014-11-11 NOTE — Progress Notes (Signed)
Patient here for follow up on her lupus Needs a referral to rheumatology Patient states she has not yet started  Her coumadin

## 2014-11-11 NOTE — Progress Notes (Signed)
MRN: [id number] Name: Cristina [name]  Sex: female Age: 28 y.o. DOB: 05/06/1986  Allergies: Review of patient's allergies indicates no known allergies.  Chief Complaint  Patient presents with  . Follow-up    HPI: Patient is 28 y.o. female who history of stroke currently following up with neurologist her she also has positive ANA and Libman-Sacks endocarditis, she was referred to rheumatology but apparently she missed her appointment and she is requesting another referral as per patient [place] and would like to see the a rheumatologist there, she was also prescribed her Coumadin by her neurologist but as per patient she has not started this medication yet, she is going to start taking this medication tonight. Patient denies any acute symptoms. As per patient she has already quit smoking.  Past Medical History  Diagnosis Date  . Stroke 06/2014  . Endocarditis of mitral valve     libman sachs?  . Heterozygous factor V Leiden mutation 08/12/2014    07/17/14    Past Surgical History  Procedure Laterality Date  . Right ear surgery    . Tee without cardioversion N/A 07/18/2014    Procedure: TRANSESOPHAGEAL ECHOCARDIOGRAM (TEE);  Surgeon: Chrystie NoseKenneth C. Hilty, MD;  [facility];  Service: Cardiovascular;  Laterality: N/A;      Medication List       This list is accurate as of: 11/11/14  4:26 PM.  Always use your most recent med list.               aspirin EC 325 MG tablet  Take 1 tablet (325 mg total) by mouth daily.     atorvastatin 10 MG tablet  Commonly known as:  LIPITOR  Take 1 tablet (10 mg total) by mouth daily at 6 PM.     loratadine 10 MG tablet  Commonly known as:  CLARITIN  Take 10 mg by mouth daily.     warfarin 5 MG tablet  Commonly known as:  COUMADIN  Take 1 tablet (5 mg total) by mouth daily.        No orders of the defined types were placed in this encounter.     There is no immunization history on file for this  patient.  Family History  Problem Relation Age of Onset  . CAD Mother   . Heart disease Mother   . Stroke Other     uncle 8950s, grandfather 8760s  . Stroke Paternal Grandfather   . Cancer Paternal Grandfather     lung cancer     History  Substance Use Topics  . Smoking status: Former Smoker -- 1.50 packs/day for 10 years    Quit date: 07/16/2014  . Smokeless tobacco: Never Used     Comment: Quit x 3 weeks  . Alcohol Use: 0.0 oz/week    0 Not specified per week     Comment: Occasionally.    Review of Systems   As noted in HPI  Filed Vitals:   11/11/14 1608  BP: 124/82  Pulse: 64  Temp: 98.5 F (36.9 C)  Resp: 16    Physical Exam  Physical Exam  Constitutional: No distress.  Eyes: EOM are normal. Pupils are equal, round, and reactive to light.  Cardiovascular: Normal rate and regular rhythm.   Pulmonary/Chest: Breath sounds normal. No respiratory distress. She has no wheezes. She has no rales.  Musculoskeletal: She exhibits no edema.    CBC    Component Value Date/Time   WBC  5.0 07/18/2014 0701   RBC 3.47* 07/18/2014 0701   HGB 11.2* 07/18/2014 0701   HCT 32.6* 07/18/2014 0701   PLT 147* 07/18/2014 0701   MCV 93.9 07/18/2014 0701   LYMPHSABS 2.1 07/17/2014 0355   MONOABS 0.4 07/17/2014 0355   EOSABS 0.1 07/17/2014 0355   BASOSABS 0.0 07/17/2014 0355    CMP     Component Value Date/Time   NA 139 07/29/2014 1303   K 4.3 07/29/2014 1303   CL 101 07/29/2014 1303   CO2 30 07/29/2014 1303   GLUCOSE 73 07/29/2014 1303   BUN 13 07/29/2014 1303   CREATININE 1.05 07/29/2014 1303   CREATININE 0.89 07/17/2014 0355   CALCIUM 9.6 07/29/2014 1303   PROT 7.1 07/29/2014 1303   ALBUMIN 4.2 07/29/2014 1303   AST 22 07/29/2014 1303   ALT 14 07/29/2014 1303   ALKPHOS 47 07/29/2014 1303   BILITOT 0.8 07/29/2014 1303   GFRNONAA 73 07/29/2014 1303   GFRNONAA 88* 07/17/2014 0355   GFRAA 84 07/29/2014 1303   GFRAA >90 07/17/2014 0355    Lab Results  Component  Value Date/Time   CHOL 104 07/17/2014 03:55 AM    No components found for: HGA1C  Lab Results  Component Value Date/Time   AST 22 07/29/2014 01:03 PM    Assessment and Plan  Positive ANA (antinuclear antibody)/Libman-Sacks endocarditis - Plan: Ambulatory referral to Rheumatology Patient is given another referral. She is also following up with neurologist.  Cerebral infarction due to thrombosis of right middle cerebral artery Patient will start taking Coumadin for tonight and come back next week on Monday for INR check.   Return for  on 11/17/2014 for INR check .  [name] CheadleADVANI, Duran Ohern, MD

## 2014-11-17 ENCOUNTER — Ambulatory Visit: Payer: Medicaid Other | Attending: Internal Medicine | Admitting: Pharmacist

## 2014-11-17 DIAGNOSIS — I058 Other rheumatic mitral valve diseases: Secondary | ICD-10-CM

## 2014-11-17 DIAGNOSIS — I059 Rheumatic mitral valve disease, unspecified: Secondary | ICD-10-CM

## 2014-11-17 LAB — POCT INR: INR: 1.1

## 2014-11-24 ENCOUNTER — Ambulatory Visit: Payer: Medicaid Other | Attending: Internal Medicine | Admitting: Pharmacist

## 2014-11-24 DIAGNOSIS — I8290 Acute embolism and thrombosis of unspecified vein: Secondary | ICD-10-CM | POA: Diagnosis not present

## 2014-11-24 LAB — POCT INR: INR: 1

## 2014-12-01 ENCOUNTER — Ambulatory Visit: Payer: Medicaid Other | Attending: Internal Medicine | Admitting: Pharmacist

## 2014-12-01 DIAGNOSIS — I63311 Cerebral infarction due to thrombosis of right middle cerebral artery: Secondary | ICD-10-CM

## 2014-12-01 DIAGNOSIS — M3211 Endocarditis in systemic lupus erythematosus: Secondary | ICD-10-CM

## 2014-12-01 DIAGNOSIS — I639 Cerebral infarction, unspecified: Secondary | ICD-10-CM

## 2014-12-01 LAB — POCT INR: INR: 1

## 2014-12-01 MED ORDER — WARFARIN SODIUM 5 MG PO TABS: 5.0000 mg | ORAL_TABLET | Freq: Every day | ORAL | Status: DC

## 2014-12-09 ENCOUNTER — Encounter: Payer: Self-pay | Admitting: Internal Medicine

## 2014-12-09 ENCOUNTER — Ambulatory Visit: Payer: Medicaid Other | Attending: Internal Medicine | Admitting: Pharmacist

## 2014-12-09 DIAGNOSIS — D6851 Activated protein C resistance: Secondary | ICD-10-CM

## 2014-12-16 ENCOUNTER — Ambulatory Visit: Payer: Medicaid Other | Attending: Internal Medicine | Admitting: Pharmacist

## 2014-12-16 DIAGNOSIS — I639 Cerebral infarction, unspecified: Secondary | ICD-10-CM

## 2014-12-16 LAB — POCT INR: INR: 1.1

## 2014-12-17 ENCOUNTER — Telehealth: Payer: Self-pay | Admitting: Internal Medicine

## 2014-12-17 NOTE — Telephone Encounter (Signed)
Left message for pt to continue taking prescribed Coumadin 7.5 mg tab daily until seen by Dr. Daleen SquibbWall next Wednesday @ 945 am

## 2014-12-17 NOTE — Telephone Encounter (Signed)
Pt had INR checked yesterday but had to leave for work before she could talk to nurse regarding dosage changes.  Pt calling to find out whether Dr ordered a script change or if dosage/directions changed. Please f/u with pt.

## 2014-12-24 ENCOUNTER — Encounter: Payer: Self-pay | Admitting: Cardiology

## 2014-12-24 ENCOUNTER — Ambulatory Visit: Payer: Medicaid Other | Attending: Cardiology | Admitting: Cardiology

## 2014-12-24 VITALS — BP 107/74 | HR 84 | Temp 98.7°F | Resp 16

## 2014-12-24 DIAGNOSIS — D6851 Activated protein C resistance: Secondary | ICD-10-CM

## 2014-12-24 DIAGNOSIS — Z8673 Personal history of transient ischemic attack (TIA), and cerebral infarction without residual deficits: Secondary | ICD-10-CM | POA: Insufficient documentation

## 2014-12-24 DIAGNOSIS — Z87891 Personal history of nicotine dependence: Secondary | ICD-10-CM | POA: Diagnosis not present

## 2014-12-24 DIAGNOSIS — M3211 Endocarditis in systemic lupus erythematosus: Secondary | ICD-10-CM | POA: Diagnosis present

## 2014-12-24 DIAGNOSIS — Z7901 Long term (current) use of anticoagulants: Secondary | ICD-10-CM | POA: Insufficient documentation

## 2014-12-24 DIAGNOSIS — D688 Other specified coagulation defects: Secondary | ICD-10-CM

## 2014-12-24 DIAGNOSIS — I63511 Cerebral infarction due to unspecified occlusion or stenosis of right middle cerebral artery: Secondary | ICD-10-CM

## 2014-12-24 DIAGNOSIS — Z0000 Encounter for general adult medical examination without abnormal findings: Secondary | ICD-10-CM

## 2014-12-24 LAB — POCT INR: INR: 1

## 2014-12-24 MED ORDER — MEDROXYPROGESTERONE ACETATE 150 MG/ML IM SUSP
150.0000 mg | Freq: Once | INTRAMUSCULAR | Status: AC
  Administered 2014-12-24: 150 mg via INTRAMUSCULAR

## 2014-12-24 NOTE — Progress Notes (Signed)
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  She has a very complicated history of the last 4 months. Admitted in July with left sided body weakness and facial droop. Further evaluation showed multiple cerebral infarcts particularly of the right middle cerebral artery territory. Echocardiogram showed thickening of the mitral valve and a transesophageal echocardiogram showed a small vegetation on the anterior leaflet of the mitral valve. Blood cultures were negative and was not given antibiotics. She had a hypercoagulable panel in consultation with Dr. Cyndie ChimeGranfortuna . ANA was positive and anti-DNA was indeterminant. She also was heterozygous for factor V assay.  She was discharged home on 325 mg of aspirin a day. Prior to admission she was a smoker but quit. She also was on oral contraceptives with estrogen and this was discontinued.  She's had follow-up with neurology with Dr.Xu who continued her aspirin but then started  her on Coumadin several months ago. Her INRs have never been therapeutic since starting the drug. She is 1.0 today. She has been compliant and is quite intelligent and informed about the drug as well as her medical condition.  She also saw Dr. Gavin [facility]le [place] who is a rheumatologist. He started her on nifedipine for Raynauds and plaqunil for lupus.   She continues not to smoke. She is anxious to get back on contraception and is not a candidate for an IUD. She needs progesterone per Dr Cyndie ChimeGranfortuna.   She denies any chest pain or palpitations. She has had no further neurological symptoms.  Past Medical History  Diagnosis Date  . Stroke 06/2014  . Endocarditis of mitral valve     libman sachs?  . Heterozygous factor V Leiden mutation 08/12/2014    07/17/14    Current Outpatient Prescriptions  Medication Sig Dispense Refill  . amLODipine (NORVASC) 2.5  MG tablet Take 2.5 mg by mouth every other day.    Marland Kitchen. atorvastatin (LIPITOR) 10 MG tablet Take 1 tablet (10 mg total) by mouth daily at 6 PM. 90 tablet 3  . hydroxychloroquine (PLAQUENIL) 200 MG tablet Take 200 mg by mouth daily.    Marland Kitchen. loratadine (CLARITIN) 10 MG tablet Take 10 mg by mouth daily.    Marland Kitchen. warfarin (COUMADIN) 5 MG tablet Take 1 tablet (5 mg total) by mouth daily. 30 tablet 0  . aspirin EC 325 MG tablet Take 1 tablet (325 mg total) by mouth daily. (Patient not taking: Reported on 12/24/2014) 90 tablet 3   No current facility-administered medications for this visit.    No Known Allergies  Family History  Problem Relation Age of Onset  . CAD Mother   . Heart disease Mother   . Stroke Other     uncle 3150s, grandfather 3460s  . Stroke Paternal Grandfather   . Cancer Paternal Grandfather     lung cancer     History   Social History  . Marital Status: Single    Spouse Name: N/A    Number of Children: 1  . Years of Education: 12   Occupational History  . [employer]     Social History Main Topics  . Smoking status: Former Smoker -- 1.50 packs/day for 10 years    Quit date: 07/16/2014  . Smokeless tobacco: Never Used     Comment: Quit x 3 weeks  . Alcohol Use: 0.0 oz/week    0 Not specified per week  Comment: Occasionally.  . Drug Use: No  . Sexual Activity: Yes   Other Topics Concern  . Not on file   Social History Narrative   Patient is single with one child.   Patient is right handed.   Patient has 12 th grade education.   Patient drinks 4-5 cups daily    ROS ALL NEGATIVE EXCEPT THOSE NOTED IN HPI  PE  General Appearance: well developed, well nourished in no acute distress HEENT: symmetrical face, PERRLA, good dentition  Neck: no JVD, thyromegaly, or adenopathy, trachea midline Chest: symmetric without deformity Cardiac: PMI non-displaced, RRR, normal S1, S2, no gallop or murmur Lung: clear to ausculation and percussion Vascular: all pulses full  without bruits  Abdominal: nondistended, nontender, good bowel sounds, no HSM, no bruits Extremities: no cyanosis, clubbing or edema, no sign of DVT, no varicosities  Skin: normal color, no rashes Neuro: alert and oriented x 3, non-focal Pysch: normal affect  EKG  BMET    Component Value Date/Time   NA 139 07/29/2014 1303   K 4.3 07/29/2014 1303   CL 101 07/29/2014 1303   CO2 30 07/29/2014 1303   GLUCOSE 73 07/29/2014 1303   BUN 13 07/29/2014 1303   CREATININE 1.05 07/29/2014 1303   CREATININE 0.89 07/17/2014 0355   CALCIUM 9.6 07/29/2014 1303   GFRNONAA 73 07/29/2014 1303   GFRNONAA 88* 07/17/2014 0355   GFRAA 84 07/29/2014 1303   GFRAA >90 07/17/2014 0355    Lipid Panel     Component Value Date/Time   CHOL 104 07/17/2014 0355   TRIG 54 07/17/2014 0355   HDL 53 07/17/2014 0355   CHOLHDL 2.0 07/17/2014 0355   VLDL 11 07/17/2014 0355   LDLCALC 40 07/17/2014 0355    CBC    Component Value Date/Time   WBC 5.0 07/18/2014 0701   RBC 3.47* 07/18/2014 0701   HGB 11.2* 07/18/2014 0701   HCT 32.6* 07/18/2014 0701   PLT 147* 07/18/2014 0701   MCV 93.9 07/18/2014 0701   MCH 32.3 07/18/2014 0701   MCHC 34.4 07/18/2014 0701   RDW 12.4 07/18/2014 0701   LYMPHSABS 2.1 07/17/2014 0355   MONOABS 0.4 07/17/2014 0355   EOSABS 0.1 07/17/2014 0355   BASOSABS 0.0 07/17/2014 0355

## 2014-12-24 NOTE — Patient Instructions (Addendum)
Thanks for coming to see Dr. Daleen SquibbWall today! The plan is to stop taking Coumadin and continue Aspirin EC 325 mg tab daily Return to follow up with provider  Return for for DEPO shot on March 17-31st.

## 2014-12-24 NOTE — Assessment & Plan Note (Signed)
This is a clinical and echocardiographic diagnosis. She does have thickened mitral valve leaflets and the small mobile target could've been this all along and not a clot. I've explained this to the patient in detail and she was already well educated on this. This does not call for anticoagulation. No follow-up echocardiogram is needed or indicated.

## 2014-12-24 NOTE — Progress Notes (Signed)
Pt here to see Dr. Daleen SquibbWall to evaluate continued Coumadin therapy s/p Hx Heterozygous factor V Leiden mutation  Pt has been non therapeutic every week with taking Coumadin  States she has been experiencing bright red blood with wiping  Denies pain at this time VSS-

## 2014-12-24 NOTE — Assessment & Plan Note (Signed)
Her presentation this summer suggested multiple cerebral emboli. At the time she was a smoker and also was on oral contraceptives. Thorough anticoagulation workup did not reveal an indication for anticoagulation beyond a full strength aspirin. I've discussed this today with Dr Cyndie ChimeGranfortuna who recommends we stop Coumadin. We'll continue with a full strength aspirin. She was advised to  never smoke again and we will also start her on depo-progesterone which we administered today. Patient understands that there is still a small risk for clot formation but given the option prefers not to be on anticoagulation. She also does not need to be on Lipitor. Note that her cholesterol was very low prior to beginning the statin.

## 2014-12-24 NOTE — Assessment & Plan Note (Signed)
See above comments.

## 2014-12-31 ENCOUNTER — Ambulatory Visit: Payer: Medicaid Other | Admitting: Cardiology

## 2015-01-06 ENCOUNTER — Other Ambulatory Visit: Payer: Self-pay

## 2015-01-06 ENCOUNTER — Encounter: Payer: Self-pay | Admitting: Neurology

## 2015-01-06 ENCOUNTER — Ambulatory Visit (INDEPENDENT_AMBULATORY_CARE_PROVIDER_SITE_OTHER): Payer: Medicaid Other | Admitting: Neurology

## 2015-01-06 VITALS — BP 104/67 | HR 73 | Ht 63.0 in | Wt 116.4 lb

## 2015-01-06 DIAGNOSIS — R768 Other specified abnormal immunological findings in serum: Secondary | ICD-10-CM

## 2015-01-06 DIAGNOSIS — M3211 Endocarditis in systemic lupus erythematosus: Secondary | ICD-10-CM

## 2015-01-06 DIAGNOSIS — I639 Cerebral infarction, unspecified: Secondary | ICD-10-CM

## 2015-01-06 DIAGNOSIS — I63419 Cerebral infarction due to embolism of unspecified middle cerebral artery: Secondary | ICD-10-CM | POA: Insufficient documentation

## 2015-01-06 DIAGNOSIS — M329 Systemic lupus erythematosus, unspecified: Secondary | ICD-10-CM | POA: Insufficient documentation

## 2015-01-06 NOTE — Progress Notes (Signed)
STROKE NEUROLOGY FOLLOW UP NOTE  NAME: Cristina [name] DOB: 06/03/1986  REASON FOR VISIT: stroke follow up HISTORY FROM: pt and chart  Today we had the pleasure of seeing Cristina [name] in follow-up at our Neurology Clinic. Pt was accompanied by no one.   History Summary Ms. Cristina [name] is a 29 y.o. female presenting with left arm weakness and facial droop. Initial MRI showed right MCA scattered embolic infarcts, and then repeat MRI showed bilateral MCA embolic stroke, consistent with cardioembolic. Patient does have a history of cigarette smoking, OCP use and migraines. She does report a headache the day prior to admission. However, further extensive work up showed no DVT, no PFO, and negative CTA and CUS. 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 ID consulted but culture negative and other work up also negative. No Abx started and infectious endocarditis felt not likely. Her symptoms improved a lot and she was discharged with ASA 325 and low dose lipitor for stroke prevention.  Follow up 09/19/14 - the patient has been doing well. She had no recurrent strokes and her left arm and hand function much improved. She went to see ID and was discharged from their clinic and she was referred to see hematologist Dr. 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 Requesting to go back to work.    Follow up 10/30/14 - she has been doing well most of the part. However, she had one episode of feeling generalized weakness for 2 days without other neurological symptoms. Since stroke, she noticed that she has condition of episodic  fingertip pallor and numbness, initially in the left hand but now also in right hand, and recently happens daily. She went to rheumatology consult two weeks ago but denied due to late on the appointment. She stated that she is going to see a rheumatologist in [place].   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 Unfortunately, her INR never therapeutic although she claims compliance. She went to see [facility] 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 She also saw Dr. Daleen SquibbWall from cardiology and recommended no anticoagulation. She was taken off coumadin and currently on ASA 325. She also got depo-progesterone for contraception as pregnancy may pose big risk for her and her fetus due to condition of lupus.  REVIEW OF SYSTEMS: Full 14 system review of systems performed and notable only for those listed below and in HPI above, all others are negative:  Constitutional: N/A  Cardiovascular: N/A  Ear/Nose/Throat: runny nose  Skin: rash  Eyes: blurry vision  Respiratory: N/A  Gastroitestinal: constipation  Genitourinary: N/A Hematology/Lymphatic: N/A  Endocrine: N/A  Musculoskeletal: aching muscles  Allergy/Immunology: N/A  Neurological: headache, numbness Psychiatric: anxious  The following represents the patient's updated allergies and side effects list: No Known Allergies  Labs since last visit of relevance include the following: Results for orders placed or performed in visit on 12/24/14  INR  Result Value Ref Range   INR  1.0     The neurologically relevant items on the patient's problem list were reviewed on today's visit.  Neurologic Examination  A problem focused neurological exam (12 or more points of the single system neurologic examination, vital signs counts  as 1 point, cranial nerves count for 8 points) was performed.  Blood pressure 104/67, pulse 73, height  (1.6 m), weight 116 lb 6.4 oz (52.799 kg).  General - Well nourished, well developed, in no apparent distress.  Ophthalmologic - Sharp disc margins OU.  Cardiovascular - Regular rate and rhythm with no murmur.  Mental Status -  Level of arousal and orientation to time, place, and person were intact. Language including expression, naming, repetition, comprehension, reading, and writing was assessed and found intact. Attention span and concentration were normal. Recent and remote memory were intact. Fund of Knowledge was assessed and was intact.  Cranial Nerves II - XII - II - Visual field intact OU. III, IV, VI - Extraocular movements intact. V - Facial sensation intact bilaterally. VII - Facial movement intact bilaterally. VIII - Hearing & vestibular intact bilaterally. X - Palate elevates symmetrically. XI - Chin turning & shoulder shrug intact bilaterally. XII - Tongue protrusion intact.  Motor Strength - The patient's strength was normal in all extremities and pronator drift was absent.  Bulk was normal and fasciculations were absent.   Motor Tone - Muscle tone was assessed at the neck and appendages and was normal.  Reflexes - The patient's reflexes were normal in all extremities and she had no pathological reflexes.  Sensory - Light touch, temperature/pinprick, vibration and proprioception, and Romberg testing were assessed and were normal.    Coordination - The patient had normal movements in the hands and feet with no ataxia or dysmetria.  Tremor was absent.  Gait and Station - The patient's transfers, posture, gait, station, and turns were observed as normal.  Data reviewed: I personally reviewed the images and agree with the radiology interpretations.  CT Angio Head 07/16/2014 Intermittent occlusion of right middle cerebral artery insular branch, which could  reflect thromboembolic disease or possible vasculopathy. In addition, relative asymmetric, decreased distal right anterior cerebral artery vessels, equivocal for clinically significant finding, could reflect normal variant. Findings may be better characterized on MRI of the head with and without contrast including gradient sequences. Right parietal transcortical encephalomalacia may reflect remote infarct or possibly head trauma.   CT Angio Neck 07/16/2014 Normal CT angiogram of the neck.   CT of the brain 07/16/2014 Negative CT head.   MRI of the brain without contrast 07/16/2014 Numerous punctate acute infarctions in the right posterior temporal and parietal region consistent with micro embolic infarctions in the right middle cerebral artery territory.   MRI of the brain  07/17/2014  1. Interval progression of right MCA infarcts. Right operculum and parietal lobe confluent [name] affected now. Small cortical infarcts in the peri-Rolandic region.  2. New left superior frontal gyrus, left ACA - left MCA watershed type, infarct. Small right occipital lacunar infarcts, possibly also  in the posterior left temporal lobe.  3. The constellation of recent CTA and MRI data suggests a proximal aortic or cardiac source of emboli.  4. No mass effect or hemorrhage. No abnormal enhancement.  5. Major vascular flow voids appear stable to the recent CTA, including evidence of abnormal flow in the right MCA dominant  posterior sylvian branch.   Lower extremity Dopplers  07/17/2014  No evidence of deep vein or superficial thrombosis involving the  right lower extremity and left lower extremity. - . Incidental findings are consistent with: Baker's Cyst on the left. - No evidence of Baker&'s cyst on the right.   2D Echocardiogram - ejection fraction 55-60%. No cardiac source of emboli identified.   CXR - No active cardiopulmonary disease.   EKG - sinus tachycardia. For complete results please see formal report.     TCD with bubble  07/17/2014  Impression: negative Transcranial Doppler Bubble Study indicative of NO right to left shunt.   Hypercoagulable Panel  ANA - positive x 2 dsDNA - 5 - indeterminate Anti-smith - neg Rheumatoid factor - < 10  ANCA screen - negative  Sojourn syndrome A and B - negative  C3 complement - 88 low  C4 complement - 18 within normal limits  HIV - nonreactive  Antithrombin III - 85 within normal limits  Serum homocystine - 11.7 within normal limits  RPR - nonreactive  Sedimentation rate - 11 within normal limits  Antinuclear body titer - negative  Total complement - 52, normal  Protein C activity - 114 within normal limits  Protein C total - 76 low normal  Protein S. activity - 61 slightly low  Protein S. 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 Small mobile vegetation on the tip of the anterior mitral leaflet - this could represent endocarditis or Libman-Sacks endocarditis.  2. No LAA thrombus  3. No PFO or atrial septal defect  4. Normal LV function and wall motion  LDL 40 and A1C 5.5  Assessment: As you may recall, she is a 29 y.o. Caucasian female with PMH of migraine, OCP use and smoker admitted in 06/2014 due to bilaterally MCA stroke due to cardioemboli. Extensive work up done as listed above, but most likely etiology is related to the small mobile vegetation in MV found on TEE. ID ruled out infectious endocarditis. 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 She was put on amlodipine QOD and plaquenil.   She was put on coumadin due to emblic stroke and mobile  MV vegetation showing on TEE. Unfortunately, INR never therapeutic. She saw Dr. Daleen Squibb from cardiology and was taken off anticoagulation and currently on ASA 325mg . She was also received depo-progesterone for contraception.   She was smoker and taken OCP and has slightly low protein S and heterozygous factor V mutation. However, that is only exclusive related to venous clotting disorder. Pt has no PFO on TCD bubble study, so these risk factors can not explain her bilateral cardioembolic MCA strokes. 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 I will repeat her TEE again to look at the vegetation, if it disappears, no anticoagulation needed, if persists, anticoagulation is still the way to go. I may consider NOAC after discussion with her insurance. Of course, I would like opinion from her rheumatologist also.   Plan:  - continue ASA 325 for stroke prevention.  - repeat TEE. - follow up with rheumatologist tomorrow - would like opinion from rheumatologist  especially the opinion regarding anticoagulation. - continue abstain from smoking - any stroke like symptoms, she knows to go to ER or call 911 for evaluation. - RTC in 6 months.  Orders Placed This Encounter  Procedures  . 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 Depending on the result, will think about anticoagulation at that time - follow up with rheumatologist tomorrow.  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 Gavin Potters regarding his opinion about anticoagulation in the setting of Lupus and embolic stroke and limban-sacks endocarditis. - follow up in 6 months.    Marvel Plan, MD PhD [facility] [address] [place], [place] [address] [phone]

## 2015-01-06 NOTE — Patient Instructions (Signed)
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 Depending on the result, will think about anticoagulation at that time - follow up with rheumatologist tomorrow.  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 Gavin PottersKernodle regarding his opinion about anticoagulation in the setting of Lupus and embolic stroke and limban-sacks endocarditis. - follow up in 6 months.

## 2015-01-07 ENCOUNTER — Telehealth: Payer: Self-pay | Admitting: Neurology

## 2015-01-07 ENCOUNTER — Telehealth: Payer: Self-pay

## 2015-01-07 ENCOUNTER — Telehealth: Payer: Self-pay | Admitting: *Deleted

## 2015-01-07 NOTE — Telephone Encounter (Signed)
LMVM of patient to inform her that [facility] is attempting to contact her and to please call them back to schedule a test requested by her provider.

## 2015-01-07 NOTE — Telephone Encounter (Signed)
-----   Message from Pricilla HolmSharon B Ferguson sent at 01/06/2015  2:28 PM EST ----- Regarding: Cristina [name] Jeniecea with [facility]  Need to set up patient for TEE  - she spoke with Apple Dearmas.   [phone]

## 2015-01-07 NOTE — Telephone Encounter (Signed)
This pts Neurology office called on 1/12 to request the pt be set up for a TEE for cardioembolic stroke per Dr Scheryl MartenJindong.  Multiple attempts have been made to reach out to this pt to have the TEE set up.  Pt has not returned a call back to our office.  Updated the pts Neurology office [facility] Dr Scheryl MartenJindong to inform them of multiple attempts with no call back.  [facility], and requesting they reach out to the pt to follow-up with our office.  Jeniece verbalized understanding and agrees with this plan.  Will route this message to triage pool for continuation of follow-up.

## 2015-01-07 NOTE — Telephone Encounter (Signed)
-----   Message from Sharon B Ferguson sent at 01/06/2015  2:28 PM EST ----- Regarding: Cristina [name] with [facility]  Need to set up patient for Cristina  - she spoke with Ivy.   [phone] ext [phone] 

## 2015-01-07 NOTE — Telephone Encounter (Signed)
Patient calls back this pm. She is not able to schedule a TEE till next week. Scheduled for 01/13/2015 at 9:00 with Dr. Anne FuSkains. Instruction letter mailed to patient.

## 2015-01-07 NOTE — Telephone Encounter (Signed)
Attempted to call patient twice. Have left her messages  With no return call Cannot schedule a TEE without knowing her schedule.

## 2015-01-13 ENCOUNTER — Ambulatory Visit (HOSPITAL_COMMUNITY)
Admission: RE | Admit: 2015-01-13 | Discharge: 2015-01-13 | Disposition: A | Discharge status: Home / Self Care | Payer: Medicaid Other | Source: Ambulatory Visit | Attending: Cardiology | Admitting: Cardiology

## 2015-01-13 ENCOUNTER — Encounter (HOSPITAL_COMMUNITY): Payer: Self-pay | Admitting: *Deleted

## 2015-01-13 ENCOUNTER — Encounter (HOSPITAL_COMMUNITY)
Admission: RE | Disposition: A | Discharge status: Home / Self Care | Payer: Self-pay | Source: Ambulatory Visit | Attending: Cardiology

## 2015-01-13 DIAGNOSIS — I7300 Raynaud's syndrome without gangrene: Secondary | ICD-10-CM | POA: Insufficient documentation

## 2015-01-13 DIAGNOSIS — Z87891 Personal history of nicotine dependence: Secondary | ICD-10-CM | POA: Insufficient documentation

## 2015-01-13 DIAGNOSIS — I059 Rheumatic mitral valve disease, unspecified: Secondary | ICD-10-CM | POA: Insufficient documentation

## 2015-01-13 DIAGNOSIS — Z823 Family history of stroke: Secondary | ICD-10-CM | POA: Diagnosis not present

## 2015-01-13 DIAGNOSIS — Z7982 Long term (current) use of aspirin: Secondary | ICD-10-CM | POA: Insufficient documentation

## 2015-01-13 DIAGNOSIS — Z7901 Long term (current) use of anticoagulants: Secondary | ICD-10-CM | POA: Insufficient documentation

## 2015-01-13 DIAGNOSIS — I638 Other cerebral infarction: Secondary | ICD-10-CM

## 2015-01-13 DIAGNOSIS — M329 Systemic lupus erythematosus, unspecified: Secondary | ICD-10-CM | POA: Diagnosis not present

## 2015-01-13 DIAGNOSIS — I639 Cerebral infarction, unspecified: Secondary | ICD-10-CM | POA: Diagnosis present

## 2015-01-13 HISTORY — PX: TEE WITHOUT CARDIOVERSION: SHX5443

## 2015-01-13 SURGERY — ECHOCARDIOGRAM, TRANSESOPHAGEAL
Anesthesia: Moderate Sedation

## 2015-01-13 MED ORDER — MIDAZOLAM HCL 10 MG/2ML IJ SOLN
INTRAMUSCULAR | Status: DC | PRN
  Administered 2015-01-13 (×2): 2 mg via INTRAVENOUS

## 2015-01-13 MED ORDER — BUTAMBEN-TETRACAINE-BENZOCAINE 2-2-14 % EX AERO
INHALATION_SPRAY | CUTANEOUS | Status: DC | PRN
  Administered 2015-01-13: 2 via TOPICAL

## 2015-01-13 MED ORDER — MIDAZOLAM HCL 5 MG/ML IJ SOLN
INTRAMUSCULAR | Status: AC
  Filled 2015-01-13: qty 2

## 2015-01-13 MED ORDER — FENTANYL CITRATE 0.05 MG/ML IJ SOLN
INTRAMUSCULAR | Status: AC
  Filled 2015-01-13: qty 2

## 2015-01-13 MED ORDER — SODIUM CHLORIDE 0.9 % IV SOLN: INTRAVENOUS | Status: DC

## 2015-01-13 MED ORDER — FENTANYL CITRATE 0.05 MG/ML IJ SOLN
INTRAMUSCULAR | Status: DC | PRN
  Administered 2015-01-13: 25 ug via INTRAVENOUS

## 2015-01-13 NOTE — CV Procedure (Signed)
    TEE  INDICATION: CVA, prior endocarditis  No mass No Vegetation (prior mitral vegetation has healed) Negative bubble study  Normal EF  Reassuring  Donato SchultzSKAINS, Cristina Gallogly, MD

## 2015-01-13 NOTE — Progress Notes (Signed)
Echocardiogram Echocardiogram Transesophageal has been performed.  Cristina [name], Cristina [name] 01/13/2015, 8:41 AM

## 2015-01-13 NOTE — Discharge Instructions (Signed)
Transesophageal Echocardiogram °Transesophageal echocardiography (TEE) is a special type of test that produces images of the heart by using sound waves (echocardiogram). This type of echocardiography can obtain better images of the heart than standard echocardiography. TEE is done by passing a flexible tube down the esophagus. The heart is located in front of the esophagus. Because the heart and esophagus are close to one another, your health care provider can take very clear, detailed pictures of the heart via ultrasound waves. °TEE may be done: °· If your health care provider needs more information based on standard echocardiography findings. °· If you had a stroke. This might have happened because a clot formed in your heart. TEE can visualize different areas of the heart and check for clots. °· To check valve anatomy and function. °· To check for infection on the inside of your heart (endocarditis). °· To evaluate the dividing wall (septum) of the heart and presence of a hole that did not close after birth (patent foramen ovale or atrial septal defect). °· To help diagnose a tear in the wall of the aorta (aortic dissection). °· During cardiac valve surgery. This allows the surgeon to assess the valve repair before closing the chest. °· During a variety of other cardiac procedures to guide positioning of catheters. °· Sometimes before a cardioversion, which is a shock to convert heart rhythm back to normal. °LET YOUR HEALTH CARE PROVIDER KNOW ABOUT:  °· Any allergies you have. °· All medicines you are taking, including vitamins, herbs, eye drops, creams, and over-the-counter medicines. °· Previous problems you or members of your family have had with the use of anesthetics. °· Any blood disorders you have. °· Previous surgeries you have had. °· Medical conditions you have. °· Swallowing difficulties. °· An esophageal obstruction. °RISKS AND COMPLICATIONS  °Generally, TEE is a safe procedure. However, as with any  procedure, complications can occur. Possible complications include an esophageal tear (rupture). °BEFORE THE PROCEDURE  °· Do not eat or drink for 6 hours before the procedure or as directed by your health care provider. °· Arrange for someone to drive you home after the procedure. Do not drive yourself home. During the procedure, you will be given medicines that can continue to make you feel drowsy and can impair your reflexes. °· An IV access tube will be started in the arm. °PROCEDURE  °· A medicine to help you relax (sedative) will be given through the IV access tube. °· A medicine may be sprayed or gargled to numb the back of the throat. °· Your blood pressure, heart rate, and breathing (vital signs) will be monitored during the procedure. °· The TEE probe is a long, flexible tube. The tip of the probe is placed into the back of the mouth, and you will be asked to swallow. This helps to pass the tip of the probe into the esophagus. Once the tip of the probe is in the correct area, your health care provider can take pictures of the heart. °· TEE is usually not a painful procedure. You may feel the probe press against the back of the throat. The probe does not enter the trachea and does not affect your breathing. °AFTER THE PROCEDURE  °· You will be in bed, resting, until you have fully returned to consciousness. °· When you first awaken, your throat may feel slightly sore and will probably still feel numb. This will improve slowly over time. °· You will not be allowed to eat or drink until it   is clear that the numbness has improved. °· Once you have been able to drink, urinate, and sit on the edge of the bed without feeling sick to your stomach (nausea) or dizzy, you may be cleared to go home. °· You should have a friend or family member with you for the next 24 hours after your procedure. °Document Released: 03/04/2003 Document Revised: 12/17/2013 Document Reviewed: 06/13/2013 °[employer]® Patient Information  ©2015 [employer]. This information is not intended to replace advice given to you by your health care provider. Make sure you discuss any questions you have with your health care provider. ° °

## 2015-01-13 NOTE — Interval H&P Note (Signed)
History and Physical Interval Note:  01/13/2015 8:28 AM  [name]  has presented today for surgery, with the diagnosis of CVA  The various methods of treatment have been discussed with the patient and family. After consideration of risks, benefits and other options for treatment, the patient has consented to  Procedure(s): TRANSESOPHAGEAL ECHOCARDIOGRAM (TEE) (N/A) as a surgical intervention .  The patient's history has been reviewed, patient examined, no change in status, stable for surgery.  I have reviewed the patient's chart and labs.  Questions were answered to the patient's satisfaction.     Bianney Rockwood

## 2015-01-13 NOTE — H&P (View-Only) (Signed)
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  She has a very complicated history of the last 4 months. Admitted in July with left sided body weakness and facial droop. Further evaluation showed multiple cerebral infarcts particularly of the right middle cerebral artery territory. Echocardiogram showed thickening of the mitral valve and a transesophageal echocardiogram showed a small vegetation on the anterior leaflet of the mitral valve. Blood cultures were negative and was not given antibiotics. She had a hypercoagulable panel in consultation with Dr. Cyndie ChimeGranfortuna . ANA was positive and anti-DNA was indeterminant. She also was heterozygous for factor V assay.  She was discharged home on 325 mg of aspirin a day. Prior to admission she was a smoker but quit. She also was on oral contraceptives with estrogen and this was discontinued.  She's had follow-up with neurology with Dr.Xu who continued her aspirin but then started  her on Coumadin several months ago. Her INRs have never been therapeutic since starting the drug. She is 1.0 today. She has been compliant and is quite intelligent and informed about the drug as well as her medical condition.  She also saw Dr. Gavin [place] who [place] a rheumatologist. He started her on nifedipine for Raynauds and plaqunil for lupus.   She continues not to smoke. She is anxious to get back on contraception and is not a candidate for an IUD. She needs progesterone per Dr Cyndie ChimeGranfortuna.   She denies any chest pain or palpitations. She has had no further neurological symptoms.  Past Medical History  Diagnosis Date  . Stroke 06/2014  . Endocarditis of mitral valve     libman sachs?  . Heterozygous factor V Leiden mutation 08/12/2014    07/17/14    Current Outpatient Prescriptions  Medication Sig Dispense Refill  . amLODipine (NORVASC) 2.5  MG tablet Take 2.5 mg by mouth every other day.    Marland Kitchen. atorvastatin (LIPITOR) 10 MG tablet Take 1 tablet (10 mg total) by mouth daily at 6 PM. 90 tablet 3  . hydroxychloroquine (PLAQUENIL) 200 MG tablet Take 200 mg by mouth daily.    Marland Kitchen. loratadine (CLARITIN) 10 MG tablet Take 10 mg by mouth daily.    Marland Kitchen. warfarin (COUMADIN) 5 MG tablet Take 1 tablet (5 mg total) by mouth daily. 30 tablet 0  . aspirin EC 325 MG tablet Take 1 tablet (325 mg total) by mouth daily. (Patient not taking: Reported on 12/24/2014) 90 tablet 3   No current facility-administered medications for this visit.    No Known Allergies  Family History  Problem Relation Age of Onset  . CAD Mother   . Heart disease Mother   . Stroke Other     uncle 3150s, grandfather 3460s  . Stroke Paternal Grandfather   . Cancer Paternal Grandfather     lung cancer     History   Social History  . Marital Status: Single    Spouse Name: N/A    Number of Children: 1  . Years of Education: 12   Occupational History  . [employer]     Social History Main Topics  . Smoking status: Former Smoker -- 1.50 packs/day for 10 years    Quit date: 07/16/2014  . Smokeless tobacco: Never Used     Comment: Quit x 3 weeks  . Alcohol Use: 0.0 oz/week    0 Not specified per week  Comment: Occasionally.  . Drug Use: No  . Sexual Activity: Yes   Other Topics Concern  . Not on file   Social History Narrative   Patient is single with one child.   Patient is right handed.   Patient has 12 th grade education.   Patient drinks 4-5 cups daily    ROS ALL NEGATIVE EXCEPT THOSE NOTED IN HPI  PE  General Appearance: well developed, well nourished in no acute distress HEENT: symmetrical face, PERRLA, good dentition  Neck: no JVD, thyromegaly, or adenopathy, trachea midline Chest: symmetric without deformity Cardiac: PMI non-displaced, RRR, normal S1, S2, no gallop or murmur Lung: clear to ausculation and percussion Vascular: all pulses full  without bruits  Abdominal: nondistended, nontender, good bowel sounds, no HSM, no bruits Extremities: no cyanosis, clubbing or edema, no sign of DVT, no varicosities  Skin: normal color, no rashes Neuro: alert and oriented x 3, non-focal Pysch: normal affect  EKG  BMET    Component Value Date/Time   NA 139 07/29/2014 1303   K 4.3 07/29/2014 1303   CL 101 07/29/2014 1303   CO2 30 07/29/2014 1303   GLUCOSE 73 07/29/2014 1303   BUN 13 07/29/2014 1303   CREATININE 1.05 07/29/2014 1303   CREATININE 0.89 07/17/2014 0355   CALCIUM 9.6 07/29/2014 1303   GFRNONAA 73 07/29/2014 1303   GFRNONAA 88* 07/17/2014 0355   GFRAA 84 07/29/2014 1303   GFRAA >90 07/17/2014 0355    Lipid Panel     Component Value Date/Time   CHOL 104 07/17/2014 0355   TRIG 54 07/17/2014 0355   HDL 53 07/17/2014 0355   CHOLHDL 2.0 07/17/2014 0355   VLDL 11 07/17/2014 0355   LDLCALC 40 07/17/2014 0355    CBC    Component Value Date/Time   WBC 5.0 07/18/2014 0701   RBC 3.47* 07/18/2014 0701   HGB 11.2* 07/18/2014 0701   HCT 32.6* 07/18/2014 0701   PLT 147* 07/18/2014 0701   MCV 93.9 07/18/2014 0701   MCH 32.3 07/18/2014 0701   MCHC 34.4 07/18/2014 0701   RDW 12.4 07/18/2014 0701   LYMPHSABS 2.1 07/17/2014 0355   MONOABS 0.4 07/17/2014 0355   EOSABS 0.1 07/17/2014 0355   BASOSABS 0.0 07/17/2014 0355

## 2015-01-14 ENCOUNTER — Encounter (HOSPITAL_COMMUNITY): Payer: Self-pay | Admitting: Cardiology

## 2015-01-21 ENCOUNTER — Encounter: Payer: Self-pay | Admitting: Oncology

## 2015-01-21 NOTE — Progress Notes (Unsigned)
Patient ID: Cristina [name], female   DOB: 04/24/1986, 29 y.o.   MRN: [id number] Hematology note: This is a young woman who had what appears to have been an embolic stroke back in July 2015:  "Numerous punctate acute infarctions in the right posterior temporal and parietal region consistent with micro embolic infarctions in theright middle cerebral artery territory."  As part of her evaluation, she was found to be a heterozygote for the factor V Leiden gene mutation. She is a cigarette smoker. She was using oral contraceptives containing estrogen. I saw her for in office consultation on 08/12/2014. I did not feel that these were risk factors for her arterial event. Infectious etiology was excluded at time of initial hospitalization and infectious disease consultation. 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 Subsequent to her visit with me, she was also evaluated by cardiology, Dr. Juanito Doomom Wall, and had follow-up visits with neurology, Dr. Roda ShuttersXu. After my initial discussions with Dr. Daleen SquibbWall and Dr.Xu, I recommended aspirin alone as thromboprophylaxis. She was referred to a rheumatologist, Dr. Gavin [facility]le. I have been able to contact Dr. Gavin PottersKernodle and we discussed her management. In addition to the Reynaud's, at time of his exam she had a lievedo reticularis rash. He repeated a number of studies with similar results. Although ANA was positive, titer was 1:40. Anti-DNA antibodies low indeterminate level at 5 international units, negative less than 4, positive greater than 10. Rheumatoid factor negative. Anti-Smith antibodies not detected. Cold agglutinins were not detected. Serum immunoglobulins normal and no monoclonal proteins on IFE. Repeat lupus anticoagulant testing and antiphospholipid antibody and beta 2 glycoprotein 1 antibody testing was negative.  I went back and found the original TEE report  which I could not find at time of my initial consultation, only references in other physician's notes stating that presence of a vegetation on the mitral valve was indeterminate, rule out vegetation versus fibroelastoma. The original report states that a small mobile vegetation was well visualized on the tip of the anterior mitral leaflet.   Dr. Gavin PottersKernodle felt there was enough evidence clinically to support a diagnosis of a developing collagen vascular disorder and started the patient on Plaquenil. Full dose anticoagulation with warfarin initiated.  A repeat transesophageal echocardiogram was done on January 19. No vegetations were visualized. In my opinion, this makes it less likely that the original lesion was a fibroelastoma and more likely that this was, in fact,  Libman Sacks endocarditis.  I reviewed the literature and most of the recommendations for anticoagulation were made based on a very inadequate database derived primarily from patients with cancer who had marantic endocarditis. The suggestion that warfarin was ineffective in preventing new events was extracted from this patient population.  When one looks at the data with respect to patients with collagen vascular disorders and specifically antiphospholipid antibody syndrome, there are really no good data with respect to the optimal type or duration of anticoagulant. Everyone refers to a 1987 review article on the subject that states that there is an approximate 50% recurrence of thrombotic events in people with nonbacterial thromboemboli. When I went back and looked at this article, they are describing patients with Trousseau syndrome in the setting of cancer. There are no prospective randomized studies in any population. The embolic event rate in patients with lupus or antiphospholipid antibody syndrome has not been clearly defined.  Expert opinion as voiced  by Dr. Fonnie Jarvis, international coagulation expert at [facility] medical school and  published in the Up  to date database, reflects recommendations based on the inadequate data above. I am certainly not an international expert but in my opinion, there is no reason to believe that, outside of the setting of Trousseau syndrome, that standard anticoagulation with either warfarin or one of the new Xa or direct thrombin inhibitors wouldn't be good therapy for patients with lupus or antiphospholipid antibody related disease. I don't think we have an answer with respect to duration of therapy although there are good data that patients with antiphospholipid antibody syndrome with previous extracranial thrombotic events have a high recurrence of thrombosis and should be on long-term anticoagulation. At our recent national hematology meeting in December 2015, even this recommendation was coming under question.  Impression: After discussion with all of my colleagues noted above, given this patient's presentation and possibility of a developing collagen vascular disorder, and the likelihood that this was a Shirlean Schlein embolic event, I have reevaluated my initial recommendation and I concur with addition of full dose anticoagulation either with warfarin or a direct Xa inhibitor such as Xarelto. I am inclined to continue low-dose aspirin 81 mg for additional protection. Per my discussion with Dr.Xu, he is trying to obtain insurance coverage for the patient to take Xarelto since she was having a difficult time with warfarin.

## 2015-01-23 ENCOUNTER — Other Ambulatory Visit: Payer: Self-pay | Admitting: Neurology

## 2015-01-23 DIAGNOSIS — I6340 Cerebral infarction due to embolism of unspecified cerebral artery: Secondary | ICD-10-CM

## 2015-01-23 DIAGNOSIS — M3211 Endocarditis in systemic lupus erythematosus: Secondary | ICD-10-CM

## 2015-01-23 MED ORDER — RIVAROXABAN 20 MG PO TABS: 20.0000 mg | ORAL_TABLET | Freq: Every day | ORAL | Status: DC

## 2015-01-23 NOTE — Progress Notes (Signed)
After further discussion with Dr. Cyndie ChimeGranfortuna and Dr. Daleen SquibbWall over the staff messages, we have reached concensus that at the time being, she may be benefit more from taking anticoagulation for stroke prevention given her collagen vascular disease,  limban-Sacks endocarditis with embolic stroke. I discussed with her over the phone and she expressed understanding.   I also check with out pharmacy tech and was told that she need only to pay $15 per month for the Xarelto. Therefore, I ordered Xarelto to her and she will let me know if there is any problem getting the medication. In terms of ASA, I would hold off on that since her embolic stroke is more due to clot formation instead of platelet aggregation, and also combination with ASA will certainly increase her bleeding risk especially if the therapy would be long term.   Cristina PlanJindong Reinhardt Licausi, MD PhD Stroke Neurology 01/23/2015 6:07 PM

## 2015-03-04 ENCOUNTER — Encounter: Payer: Self-pay | Admitting: Internal Medicine

## 2015-03-04 ENCOUNTER — Ambulatory Visit: Payer: Medicaid Other | Attending: Internal Medicine | Admitting: Internal Medicine

## 2015-03-04 VITALS — BP 112/76 | HR 93 | Temp 98.0°F | Resp 16

## 2015-03-04 DIAGNOSIS — F419 Anxiety disorder, unspecified: Secondary | ICD-10-CM

## 2015-03-04 DIAGNOSIS — Z8673 Personal history of transient ischemic attack (TIA), and cerebral infarction without residual deficits: Secondary | ICD-10-CM

## 2015-03-04 DIAGNOSIS — F329 Major depressive disorder, single episode, unspecified: Secondary | ICD-10-CM

## 2015-03-04 DIAGNOSIS — M329 Systemic lupus erythematosus, unspecified: Secondary | ICD-10-CM

## 2015-03-04 DIAGNOSIS — F418 Other specified anxiety disorders: Secondary | ICD-10-CM

## 2015-03-04 MED ORDER — CITALOPRAM HYDROBROMIDE 10 MG PO TABS: 10.0000 mg | ORAL_TABLET | Freq: Every day | ORAL | Status: DC

## 2015-03-04 NOTE — Progress Notes (Signed)
MRN: [id number] Name: Cristina [name]  Sex: female Age: 29 y.o. DOB: 10/03/1986  Allergies: Review of patient's allergies indicates no known allergies.  Chief Complaint  Patient presents with  . Follow-up    HPI: Patient is 29 y.o. female who history of lupus, Libman-Sacks endocarditis, history of stroke, currently patient being followed up with by neurologist and rheumatologist, and is on Xarelto for stroke prevention, patient reported to have symptoms of anxiety as well as depression for the last several months as per patient sometimes it affects her daily life denies any SI or HI, patient would like to try some medication.  Past Medical History  Diagnosis Date  . Stroke 06/2014  . Endocarditis of mitral valve     libman sachs?  . Heterozygous factor V Leiden mutation 08/12/2014    07/17/14    Past Surgical History  Procedure Laterality Date  . Right ear surgery    . Tee without cardioversion N/A 07/18/2014    Procedure: TRANSESOPHAGEAL ECHOCARDIOGRAM (TEE);  Surgeon: Chrystie Nose, MD;  Location: [facility] ENDOSCOPY;  Service: Cardiovascular;  Laterality: N/A;  . Tee without cardioversion N/A 01/13/2015    Procedure: TRANSESOPHAGEAL ECHOCARDIOGRAM (TEE);  Surgeon: Donato Schultz, MD;  Location: [facility] ENDOSCOPY;  Service: Cardiovascular;  Laterality: N/A;      Medication List       This list is accurate as of: 03/04/15 10:43 AM.  Always use your most recent med list.               amLODipine 2.5 MG tablet  Commonly known as:  NORVASC  Take 2.5 mg by mouth every other day.     citalopram 10 MG tablet  Commonly known as:  CELEXA  Take 1 tablet (10 mg total) by mouth daily.     hydroxychloroquine 200 MG tablet  Commonly known as:  PLAQUENIL  Take 200 mg by mouth daily.     loratadine 10 MG tablet  Commonly known as:  CLARITIN  Take 10 mg by mouth daily as needed.     rivaroxaban 20 MG Tabs tablet  Commonly known as:  XARELTO  Take 1 tablet (20 mg total) by mouth daily  with supper.        Meds ordered this encounter  Medications  . citalopram (CELEXA) 10 MG tablet    Sig: Take 1 tablet (10 mg total) by mouth daily.    Dispense:  30 tablet    Refill:  3     There is no immunization history on file for this patient.  Family History  Problem Relation Age of Onset  . CAD Mother   . Heart disease Mother   . Stroke Other     uncle 20s, grandfather 98s  . Stroke Paternal Grandfather   . Cancer Paternal Grandfather     lung cancer     History  Substance Use Topics  . Smoking status: Former Smoker -- 1.50 packs/day for 10 years    Quit date: 07/16/2014  . Smokeless tobacco: Never Used     Comment: Quit x 3 weeks  . Alcohol Use: 0.0 oz/week    0 Standard drinks or equivalent per week     Comment: Occasionally.    Review of Systems   As noted in HPI  Filed Vitals:   03/04/15 0946  BP: 112/76  Pulse: 93  Temp: 98 F (36.7 C)  Resp: 16    Physical Exam  Physical Exam  Constitutional: No distress.  Eyes: EOM  are normal. Pupils are equal, round, and reactive to light.  Cardiovascular: Normal rate and regular rhythm.   Pulmonary/Chest: Breath sounds normal. No respiratory distress. She has no wheezes. She has no rales.  Musculoskeletal: She exhibits no edema.  Psychiatric:  anxious    CBC    Component Value Date/Time   WBC 5.0 07/18/2014 0701   RBC 3.47* 07/18/2014 0701   HGB 11.2* 07/18/2014 0701   HCT 32.6* 07/18/2014 0701   PLT 147* 07/18/2014 0701   MCV 93.9 07/18/2014 0701   LYMPHSABS 2.1 07/17/2014 0355   MONOABS 0.4 07/17/2014 0355   EOSABS 0.1 07/17/2014 0355   BASOSABS 0.0 07/17/2014 0355    CMP     Component Value Date/Time   NA 139 07/29/2014 1303   K 4.3 07/29/2014 1303   CL 101 07/29/2014 1303   CO2 30 07/29/2014 1303   GLUCOSE 73 07/29/2014 1303   BUN 13 07/29/2014 1303   CREATININE 1.05 07/29/2014 1303   CREATININE 0.89 07/17/2014 0355   CALCIUM 9.6 07/29/2014 1303   PROT 7.1 07/29/2014 1303     ALBUMIN 4.2 07/29/2014 1303   AST 22 07/29/2014 1303   ALT 14 07/29/2014 1303   ALKPHOS 47 07/29/2014 1303   BILITOT 0.8 07/29/2014 1303   GFRNONAA 73 07/29/2014 1303   GFRNONAA 88* 07/17/2014 0355   GFRAA 84 07/29/2014 1303   GFRAA >90 07/17/2014 0355    Lab Results  Component Value Date/Time   CHOL 104 07/17/2014 03:55 AM    No components found for: HGA1C  Lab Results  Component Value Date/Time   AST 22 07/29/2014 01:03 PM    Assessment and Plan  Lupus (systemic lupus erythematosus) Currently patient is on Plaquenil and following up with rheumatologist  History of stroke Patient following up with her neurologist as well as on Xarelto for stroke prevention  Anxiety and depression - Plan: Have started patient on low-dose citalopram (CELEXA) 10 MG tablet, advised patient to get immediate medical attention if she has any worsening symptoms or develop any new symptoms, she understands verbalized instructions, also advise patient that medication will take at least 3-4 weeks to work, she will let us know if needed will titrate the dose.   Health Maintenance  -Vaccinations:  Patient declined flu shot.  Return in about 3 months (around 06/04/2015), or if symptoms worsen or fail to improve, for anxiety.   This note has been created with Education officer, environmentalDragon speech recognition software and smart phrase technology. Any transcriptional errors are unintentional.    [name] CheadleADVANI, Mabrey Howland, MD

## 2015-03-04 NOTE — Progress Notes (Signed)
Patient here for follow up on her stroke Patient states since her stroke she has been feeling very emotional Her neurologist has said he can prescribe something for her but she was not Ready at that time

## 2015-03-17 ENCOUNTER — Ambulatory Visit: Payer: Medicaid Other | Attending: Internal Medicine | Admitting: *Deleted

## 2015-03-17 DIAGNOSIS — Z3042 Encounter for surveillance of injectable contraceptive: Secondary | ICD-10-CM

## 2015-03-17 DIAGNOSIS — Z3009 Encounter for other general counseling and advice on contraception: Secondary | ICD-10-CM | POA: Insufficient documentation

## 2015-03-17 MED ORDER — MEDROXYPROGESTERONE ACETATE 150 MG/ML IM SUSP
150.0000 mg | Freq: Once | INTRAMUSCULAR | Status: AC
  Administered 2015-03-17: 150 mg via INTRAMUSCULAR

## 2015-03-17 NOTE — Progress Notes (Signed)
Patient presents for Depo Provera injection States feeling well Last injection received 12/24/14 Next injection due June 7- June 16, 2015  Patient aware that f/u with PCP due 06/04/2015

## 2015-06-02 ENCOUNTER — Ambulatory Visit: Payer: Medicaid Other

## 2015-06-04 ENCOUNTER — Ambulatory Visit: Payer: Medicaid Other | Admitting: Internal Medicine

## 2015-06-05 ENCOUNTER — Ambulatory Visit: Payer: Medicaid Other | Attending: Internal Medicine | Admitting: *Deleted

## 2015-06-05 DIAGNOSIS — Z308 Encounter for other contraceptive management: Secondary | ICD-10-CM | POA: Diagnosis present

## 2015-06-05 MED ORDER — MEDROXYPROGESTERONE ACETATE 150 MG/ML IM SUSP
150.0000 mg | Freq: Once | INTRAMUSCULAR | Status: AC
  Administered 2015-06-05: 150 mg via INTRAMUSCULAR

## 2015-06-05 NOTE — Progress Notes (Signed)
Patient presents for Depo Provera injection States feeling well Last injection received 03/17/15 Next injection due 8/26 - 09/04/2015

## 2015-06-09 ENCOUNTER — Ambulatory Visit: Payer: Medicaid Other | Attending: Internal Medicine | Admitting: Internal Medicine

## 2015-06-09 ENCOUNTER — Encounter: Payer: Self-pay | Admitting: Internal Medicine

## 2015-06-09 VITALS — BP 104/71 | HR 71 | Temp 98.0°F | Resp 16 | Wt 126.0 lb

## 2015-06-09 DIAGNOSIS — M329 Systemic lupus erythematosus, unspecified: Secondary | ICD-10-CM | POA: Diagnosis not present

## 2015-06-09 DIAGNOSIS — F329 Major depressive disorder, single episode, unspecified: Secondary | ICD-10-CM

## 2015-06-09 DIAGNOSIS — Z8673 Personal history of transient ischemic attack (TIA), and cerebral infarction without residual deficits: Secondary | ICD-10-CM | POA: Diagnosis not present

## 2015-06-09 DIAGNOSIS — F418 Other specified anxiety disorders: Secondary | ICD-10-CM

## 2015-06-09 DIAGNOSIS — F419 Anxiety disorder, unspecified: Principal | ICD-10-CM

## 2015-06-09 DIAGNOSIS — F32A Depression, unspecified: Secondary | ICD-10-CM

## 2015-06-09 MED ORDER — CITALOPRAM HYDROBROMIDE 10 MG PO TABS: 10.0000 mg | ORAL_TABLET | Freq: Every day | ORAL | Status: DC

## 2015-06-09 NOTE — Progress Notes (Signed)
Patient here for follow up on her anxiety Was recently prescribed celexa and feels it is helping Patient also needs refill on this medications

## 2015-06-09 NOTE — Progress Notes (Signed)
MRN: [id number] Name: Cristina [name]  Sex: female Age: 29 y.o. DOB: 02/12/1986  Allergies: Review of patient's allergies indicates no known allergies.  Chief Complaint  Patient presents with  . Follow-up    HPI: Patient is 29 y.o. female who has history of stroke, lupus, currently following up with rheumatologist as well as neurologist and is on Xarelto for stroke prevention, on the last visit she was started on Celexa for anxiety/depression symptoms, she reports improvement and is requesting refill on her medication, she currently denies any acute symptoms.  Past Medical History  Diagnosis Date  . Stroke 06/2014  . Endocarditis of mitral valve     libman sachs?  . Heterozygous factor V Leiden mutation 08/12/2014    07/17/14    Past Surgical History  Procedure Laterality Date  . Right ear surgery    . Tee without cardioversion N/A 07/18/2014    Procedure: TRANSESOPHAGEAL ECHOCARDIOGRAM (TEE);  Surgeon: Chrystie Nose, MD;  Location: [facility] ENDOSCOPY;  Service: Cardiovascular;  Laterality: N/A;  . Tee without cardioversion N/A 01/13/2015    Procedure: TRANSESOPHAGEAL ECHOCARDIOGRAM (TEE);  Surgeon: Donato Schultz, MD;  Location: [facility] ENDOSCOPY;  Service: Cardiovascular;  Laterality: N/A;      Medication List       This list is accurate as of: 06/09/15 10:17 AM.  Always use your most recent med list.               amLODipine 2.5 MG tablet  Commonly known as:  NORVASC  Take 2.5 mg by mouth every other day.     citalopram 10 MG tablet  Commonly known as:  CELEXA  Take 1 tablet (10 mg total) by mouth daily.     hydroxychloroquine 200 MG tablet  Commonly known as:  PLAQUENIL  Take 200 mg by mouth daily.     loratadine 10 MG tablet  Commonly known as:  CLARITIN  Take 10 mg by mouth daily as needed.     rivaroxaban 20 MG Tabs tablet  Commonly known as:  XARELTO  Take 1 tablet (20 mg total) by mouth daily with supper.        Meds ordered this encounter  Medications   . citalopram (CELEXA) 10 MG tablet    Sig: Take 1 tablet (10 mg total) by mouth daily.    Dispense:  30 tablet    Refill:  5     There is no immunization history on file for this patient.  Family History  Problem Relation Age of Onset  . CAD Mother   . Heart disease Mother   . Stroke Other     uncle 8s, grandfather 24s  . Stroke Paternal Grandfather   . Cancer Paternal Grandfather     lung cancer     History  Substance Use Topics  . Smoking status: Former Smoker -- 1.50 packs/day for 10 years    Quit date: 07/16/2014  . Smokeless tobacco: Never Used     Comment: Quit x 3 weeks  . Alcohol Use: 0.0 oz/week    0 Standard drinks or equivalent per week     Comment: Occasionally.    Review of Systems   As noted in HPI  Filed Vitals:   06/09/15 0946  BP: 104/71  Pulse: 71  Temp: 98 F (36.7 C)  Resp: 16    Physical Exam  Physical Exam  Constitutional: No distress.  Eyes: EOM are normal. Pupils are equal, round, and reactive to light.  Cardiovascular: Regular  rhythm.   Pulmonary/Chest: Breath sounds normal. No respiratory distress. She has no wheezes. She has no rales.  Musculoskeletal: She exhibits no edema.    CBC    Component Value Date/Time   WBC 5.0 07/18/2014 0701   WBC 10.8 08/01/2012 0855   RBC 3.47* 07/18/2014 0701   RBC 3.84 08/01/2012 0855   HGB 11.2* 07/18/2014 0701   HGB 13.4 08/01/2012 0855   HCT 32.6* 07/18/2014 0701   HCT 30.3* 08/02/2012 0545   PLT 147* 07/18/2014 0701   PLT 166 08/01/2012 0855   MCV 93.9 07/18/2014 0701   MCV 98 08/01/2012 0855   LYMPHSABS 2.1 07/17/2014 0355   LYMPHSABS 2.5 08/01/2012 0855   MONOABS 0.4 07/17/2014 0355   MONOABS 0.5 08/01/2012 0855   EOSABS 0.1 07/17/2014 0355   EOSABS 0.1 08/01/2012 0855   BASOSABS 0.0 07/17/2014 0355   BASOSABS 0.1 08/01/2012 0855    CMP     Component Value Date/Time   NA 139 07/29/2014 1303   NA 135* 08/01/2012 0855   K 4.3 07/29/2014 1303   K 5.1 08/01/2012 0855     CL 101 07/29/2014 1303   CL 104 08/01/2012 0855   CO2 30 07/29/2014 1303   CO2 22 08/01/2012 0855   GLUCOSE 73 07/29/2014 1303   GLUCOSE 79 08/01/2012 0855   BUN 13 07/29/2014 1303   BUN 7 08/01/2012 0855   CREATININE 1.05 07/29/2014 1303   CREATININE 0.89 07/17/2014 0355   CREATININE 0.65 08/01/2012 0855   CALCIUM 9.6 07/29/2014 1303   CALCIUM 9.0 08/01/2012 0855   PROT 7.1 07/29/2014 1303   ALBUMIN 4.2 07/29/2014 1303   AST 22 07/29/2014 1303   AST 49* 08/01/2012 0855   ALT 14 07/29/2014 1303   ALKPHOS 47 07/29/2014 1303   BILITOT 0.8 07/29/2014 1303   GFRNONAA 73 07/29/2014 1303   GFRNONAA 88* 07/17/2014 0355   GFRNONAA >60 08/01/2012 0855   GFRAA 84 07/29/2014 1303   GFRAA >90 07/17/2014 0355   GFRAA >60 08/01/2012 0855    Lab Results  Component Value Date/Time   CHOL 104 07/17/2014 03:55 AM    Lab Results  Component Value Date/Time   HGBA1C 5.5 07/17/2014 03:55 AM    Lab Results  Component Value Date/Time   AST 22 07/29/2014 01:03 PM   AST 49* 08/01/2012 08:55 AM    Assessment and Plan  Anxiety and depression - Plan: symptoms have been stable, continue with citalopram (CELEXA) 10 MG tablet  History of stroke Currently on Xarelto Has a scheduled appointment with neurology next month.  Lupus (systemic lupus erythematosus) Patient is on Plaquenil and is following up with her rheumatologist.    Return in about 4 months (around 10/09/2015), or if symptoms worsen or fail to improve.   This note has been created with Education officer, environmental. Any transcriptional errors are unintentional.    [name] Cheadle, MD

## 2015-07-07 ENCOUNTER — Encounter: Payer: Self-pay | Admitting: Neurology

## 2015-07-07 ENCOUNTER — Ambulatory Visit (INDEPENDENT_AMBULATORY_CARE_PROVIDER_SITE_OTHER): Payer: Medicaid Other | Admitting: Neurology

## 2015-07-07 VITALS — BP 99/66 | HR 78 | Resp 14 | Ht 63.0 in | Wt 125.0 lb

## 2015-07-07 DIAGNOSIS — M3211 Endocarditis in systemic lupus erythematosus: Secondary | ICD-10-CM

## 2015-07-07 DIAGNOSIS — I63419 Cerebral infarction due to embolism of unspecified middle cerebral artery: Secondary | ICD-10-CM | POA: Diagnosis not present

## 2015-07-07 DIAGNOSIS — M329 Systemic lupus erythematosus, unspecified: Secondary | ICD-10-CM

## 2015-07-07 DIAGNOSIS — R51 Headache: Secondary | ICD-10-CM

## 2015-07-07 DIAGNOSIS — R519 Headache, unspecified: Secondary | ICD-10-CM | POA: Insufficient documentation

## 2015-07-07 MED ORDER — BUTALBITAL-APAP-CAFFEINE 50-325-40 MG PO TABS: 1.0000 | ORAL_TABLET | ORAL | Status: DC | PRN

## 2015-07-07 NOTE — Patient Instructions (Signed)
-   continue Xarelto for stroke prevention - continue other medications  - will do some research to see how long you need to be on Xarelto - for HA, will prescribe fioricet for headache abortive meds. Use at begnining of headache, not more than 2 pills a day and not more than 5 per week - follow up in one year

## 2015-07-08 NOTE — Progress Notes (Signed)
STROKE NEUROLOGY FOLLOW UP NOTE  NAME: Cristina [name] DOB: Jan 16, 1986  REASON FOR VISIT: stroke follow up HISTORY FROM: pt and chart  Today we had the pleasure of seeing Cristina [name] in follow-up at our Neurology Clinic. Pt was accompanied by no one.   History Summary Cristina [name] is a 29 y.o. female presenting with left arm weakness and facial droop. Initial MRI showed right MCA scattered embolic infarcts, and then repeat MRI showed bilateral MCA embolic stroke, consistent with cardioembolic. Patient does have a history of cigarette smoking, OCP use and migraines. Cristina [name] does report a headache the day prior to admission. However, further extensive work up showed no DVT, no PFO, and negative CTA and CUS. But on TEE exam, it was found that Cristina [name] had mobile vegetation at Cristina [name] MV, concerning for infectious endocarditis or Libman-Sacks endocarditis. ID consulted but culture negative and other work up also negative. No Abx started and infectious endocarditis felt not likely. Cristina [name] symptoms improved a lot and Cristina [name] was discharged with ASA 325 and low dose lipitor for stroke prevention.  Follow up 09/19/14 - the patient has been doing well. Cristina [name] had no recurrent strokes and Cristina [name] left arm and hand function much improved. Cristina [name] went to see ID and was discharged from their clinic and Cristina [name] was referred to see hematologist Dr. 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 Requesting to go back to work.    Follow up 10/30/14 - Cristina [name] has been doing well most of the part. However, Cristina [name] had one episode of feeling generalized weakness for 2 days without other neurological symptoms. Since stroke, Cristina [name] noticed that Cristina [name] has condition of episodic  fingertip pallor and numbness, initially in the left hand but now also in right hand, and recently happens daily. Cristina [name] went to rheumatology consult two weeks ago but denied due to late on the appointment. Cristina [name] stated that Cristina [name] is going to see a rheumatologist in [place].   Follow up 01/06/15 - we decided to put Cristina [name] on anticoagulation (coumadin) as Cristina [name] MV mobile vegetation and suspected lupus put Cristina [name] on high risk of recurrent embolic strokes. Unfortunately, Cristina [name] INR never therapeutic although Cristina [name] claims compliance. Cristina [name] went to see Dr. Clide Dales in [facility] for lupus, and Cristina [name] was put on low dose amlodipine QOD for Reynold's and also plaquenil for lupus. Cristina [name] also saw Dr. Daleen Squibb from cardiology and recommended no anticoagulation. Cristina [name] was taken off coumadin and currently on ASA 325. Cristina [name] also got depo-progesterone for contraception as pregnancy may pose big risk for Cristina [name] and Cristina [name] fetus due to condition of lupus.  Interval History During the interval time, Cristina [name] had repeat TEE which showed resolution of the vegetation at MV. Discussed with Dr. Oneita Kras and he feels anticoagulation is needed. Due to difficulty with INR, Cristina [name] was started on Xarelto and Cristina [name] tolerated well. Cristina [name] also continued to follow up with rheumatology Dr. Clide Dales and continued on norvasc and plaquenil. Cristina [name] recently had some blurry vision and was seen by ophthalmology and ruled out plaquenil side effect. 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 Cristina [name] is on celexa for depression. Cristina [name] has medicaid.    REVIEW OF SYSTEMS: Full 14 system review of systems performed and notable only for those listed below and in HPI above, all others are negative:  Constitutional: N/A  Cardiovascular: N/A  Ear/Nose/Throat:  Skin:  Eyes: blurry vision  Respiratory: N/A  Gastroitestinal: constipation  Genitourinary: N/A Hematology/Lymphatic: N/A  Endocrine: N/A  Musculoskeletal:  Allergy/Immunology: N/A  Neurological: headache Psychiatric: anxious  The following represents the  patient's updated allergies and side effects list: No Known Allergies  Labs since last visit of relevance include the following: Results for orders placed or performed in visit on 12/24/14  INR  Result Value Ref Range   INR 1.0     The neurologically relevant items on the patient's problem list were reviewed on today's visit.  Neurologic Examination  A problem focused neurological exam (12 or more points of the single system neurologic examination, vital signs counts as 1 point, cranial nerves count for 8 points) was performed.  Blood pressure 99/66, pulse 78, resp. rate 14, height 5\' 3"  (1.6 m), weight 125 lb (56.7 kg).  General - Well nourished, well developed, in no apparent distress.  Ophthalmologic - Sharp disc margins OU.  Cardiovascular - Regular rate and rhythm with no murmur.  Mental Status -  Level of arousal and orientation to time, place, and person were intact. Language including expression, naming, repetition, comprehension, reading, and writing was assessed and found intact. Attention span and concentration were normal. Recent and remote memory were intact. Fund of Knowledge was assessed and was intact.  Cranial Nerves II - XII - II - Visual field intact OU. III, IV, VI - Extraocular movements intact. V - Facial sensation intact bilaterally. VII - Facial movement intact bilaterally. VIII - Hearing & vestibular intact bilaterally. X - Palate elevates symmetrically. XI - Chin turning & shoulder shrug intact bilaterally. XII - Tongue protrusion intact.  Motor Strength - The patient's strength was normal in all extremities and pronator drift was absent.  Bulk was normal and fasciculations were absent.   Motor Tone - Muscle tone was assessed at the neck and appendages and was normal.  Reflexes - The patient's reflexes were normal in all extremities and Cristina [name] had no pathological reflexes.  Sensory - Light touch, temperature/pinprick, vibration and proprioception,  and Romberg testing were assessed and were normal.    Coordination - The patient had normal movements in the hands and feet with no ataxia or dysmetria.  Tremor was absent.  Gait and Station - The patient's transfers, posture, gait, station, and turns were observed as normal.  Data reviewed: I personally reviewed the images and agree with the radiology interpretations.  CT Angio Head 07/16/2014 Intermittent occlusion of right middle cerebral artery insular branch, which could reflect thromboembolic disease or possible vasculopathy. In addition, relative asymmetric, decreased distal right anterior cerebral artery vessels, equivocal for clinically significant finding, could reflect normal variant. Findings may be better characterized on MRI of the head with and without contrast including gradient sequences. Right parietal transcortical encephalomalacia may reflect remote infarct or possibly head trauma.   CT Angio Neck 07/16/2014 Normal CT angiogram of the neck.   CT of the brain 07/16/2014 Negative CT head.   MRI of the brain without contrast 07/16/2014 Numerous punctate acute infarctions in the right posterior temporal and parietal region consistent with micro embolic infarctions in the right middle cerebral artery territory.   MRI of the brain  07/17/2014  1. Interval progression of right MCA infarcts. Right operculum and parietal lobe confluent [name] affected now. Small cortical infarcts in the peri-Rolandic region.  2. New left superior frontal gyrus, left ACA - left MCA watershed type, infarct. Small right occipital lacunar  infarcts, possibly also  in the posterior left temporal lobe.  3. The constellation of recent CTA and MRI data suggests a proximal aortic or cardiac source of emboli.  4. No mass effect or hemorrhage. No abnormal enhancement.  5. Major vascular flow voids appear stable to the recent CTA, including evidence of abnormal flow in the right MCA dominant  posterior sylvian  branch.   Lower extremity Dopplers  07/17/2014  No evidence of deep vein or superficial thrombosis involving the right lower extremity and left lower extremity. - . Incidental findings are consistent with: Baker's Cyst on the left. - No evidence of Baker&'s cyst on the right.   2D Echocardiogram - ejection fraction 55-60%. No cardiac source of emboli identified.   CXR - No active cardiopulmonary disease.   EKG - sinus tachycardia. For complete results please see formal report.   TCD with bubble  07/17/2014  Impression: negative Transcranial Doppler Bubble Study indicative of NO right to left shunt.    Hypercoagulable Panel  ANA - positive x 2 dsDNA - 5 - indeterminate Anti-smith - neg Rheumatoid factor - < 10  ANCA screen - negative  Sojourn syndrome A and B - negative  C3 complement - 88 low  C4 complement - 18 within normal limits  HIV - nonreactive  Antithrombin III - 85 within normal limits  Serum homocystine - 11.7 within normal limits  RPR - nonreactive  Sedimentation rate - 11 within normal limits  Antinuclear body titer - negative  Total complement - 52, normal  Protein C activity - 114 within normal limits  Protein C total - 76 low normal  Protein S. activity - 61 slightly low  Protein S. 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 Small mobile vegetation on the tip of the anterior mitral leaflet - this could represent endocarditis or Libman-Sacks endocarditis.  2. No LAA thrombus  3. No PFO or atrial septal defect  4. Normal LV function and wall motion  TEE 01/13/15 - Left ventricle: Systolic function was normal. The estimated ejection fraction was in the range of 55% to 60%. Wall  motion was normal; there were no regional wall motion abnormalities. - Mitral valve: There was mild regurgitation. No vegetation (as was seen on prior TEE). - Left atrium: No evidence of thrombus in the atrial cavity or appendage. - Right atrium: No evidence of thrombus in the atrial cavity or appendage. - Atrial septum: No defect or patent foramen ovale was identified.  LDL 40 and A1C 5.5  Assessment: As you may recall, Cristina [name] is a 29 y.o. Caucasian female with PMH of migraine, OCP use and smoker admitted in 06/2014 due to bilaterally MCA stroke due to cardioemboli. Extensive work up done as listed above, but most likely etiology is related to the small mobile vegetation in MV found on TEE. ID ruled out infectious endocarditis. 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 Cristina [name] was put on amlodipine QOD and plaquenil.   Cristina [name] was put on coumadin due to emblic stroke and mobile MV vegetation showing on TEE. Unfortunately, INR never therapeutic. Cristina [name]  saw Dr. Daleen Squibb from cardiology and was taken off anticoagulation and put on ASA . Cristina [name] was also received depo-progesterone for contraception.   Cristina [name] was smoker and taken OCP and has slightly low protein S and heterozygous factor V mutation. However, that is only exclusive related to venous clotting disorder. Pt has no PFO on TCD bubble study, so these risk factors can not explain Cristina [name] bilateral cardioembolic MCA strokes. 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 However, repeat Cristina [name] TEE showed resolution of the vegetation. Cristina [name] was seen by Dr. Oneita Kras and considered still need anticoagulation. Due to difficulty with INR, Cristina [name] was started on Xarelto. Cristina [name] tolerated well so far. However, how long Cristina [name] need to be on [facility] is  still debatable.    Plan:  - continue Xarelto for stroke prevention.  - continue plaqenil, norvasc and celexa. - continue to follow up with rheumatologist - would like more literature search regarding the during of the [facility] therapy. - continue abstain from smoking. - RTC in one year.  No orders of the defined types were placed in this encounter.    Patient Instructions  - continue Xarelto for stroke prevention - continue other medications  - will do some research to see how long you need to be on Xarelto - for HA, will prescribe fioricet for headache abortive meds. Use at begnining of headache, not more than 2 pills a day and not more than 5 per week - follow up in one year    Marvel Plan, MD PhD [facility] [address] [place], [place] [address] [phone]

## 2015-07-27 ENCOUNTER — Other Ambulatory Visit: Payer: Self-pay | Admitting: Neurology

## 2015-08-21 ENCOUNTER — Encounter (HOSPITAL_COMMUNITY): Payer: Self-pay | Admitting: Emergency Medicine

## 2015-08-21 ENCOUNTER — Emergency Department (HOSPITAL_COMMUNITY)
Admission: EM | Admit: 2015-08-21 | Discharge: 2015-08-21 | Disposition: A | Discharge status: Home / Self Care | Payer: Medicaid Other | Source: Home / Self Care | Attending: Family Medicine | Admitting: Family Medicine

## 2015-08-21 DIAGNOSIS — J020 Streptococcal pharyngitis: Secondary | ICD-10-CM

## 2015-08-21 LAB — POCT RAPID STREP A: STREPTOCOCCUS, GROUP A SCREEN (DIRECT): NEGATIVE

## 2015-08-21 MED ORDER — AMOXICILLIN 500 MG PO CAPS: 500.0000 mg | ORAL_CAPSULE | Freq: Two times a day (BID) | ORAL | Status: DC

## 2015-08-21 NOTE — Discharge Instructions (Signed)
Pharyngitis Pharyngitis is a sore throat (pharynx). There is redness, pain, and swelling of your throat. HOME CARE   Drink enough fluids to keep your pee (urine) clear or pale yellow.  Only take medicine as told by your doctor.  You may get sick again if you do not take medicine as told. Finish your medicines, even if you start to feel better.  Do not take aspirin.  Rest.  Rinse your mouth (gargle) with salt water ( tsp of salt per 1 qt of water) every 1-2 hours. This will help the pain.  If you are not at risk for choking, you can suck on hard candy or sore throat lozenges. GET HELP IF:  You have large, tender lumps on your neck.  You have a rash.  You cough up green, yellow-brown, or bloody spit. GET HELP RIGHT AWAY IF:   You have a stiff neck.  You drool or cannot swallow liquids.  You throw up (vomit) or are not able to keep medicine or liquids down.  You have very bad pain that does not go away with medicine.  You have problems breathing (not from a stuffy nose). MAKE SURE YOU:   Understand these instructions.  Will watch your condition.  Will get help right away if you are not doing well or get worse. Document Released: 05/30/2008 Document Revised: 10/02/2013 Document Reviewed: 08/19/2013 [facility] Patient Information 2015 [place], [place]. This information is not intended to replace advice given to you by your health care provider. Make sure you discuss any questions you have with your health care provider.    Going to treat you for strep throat given the fevers and appearance of the throat. Feel better!

## 2015-08-21 NOTE — ED Notes (Signed)
C/o ST onset Tuesday associated w/fevers, odynophagia and bilateral ear pain Taking OTC tyle w/no relief Alert... No acute distress.

## 2015-08-21 NOTE — ED Provider Notes (Signed)
CSN: [id number]     Arrival date & time 08/21/15  1609 History   First MD Initiated Contact with Patient 08/21/15 1723     Chief Complaint  Patient presents with  . Sore Throat   (Consider location/radiation/quality/duration/timing/severity/associated sxs/prior Treatment) HPI Comments: Patient presents with a 3 day history of sore throat, ear pain, fevers and malaise. No cough or congestion. No sinus congestion. No prior exposures.   Patient is a 29 y.o. female presenting with pharyngitis. The history is provided by the patient.  Sore Throat    Past Medical History  Diagnosis Date  . Stroke 06/2014  . Endocarditis of mitral valve     libman sachs?  . Heterozygous factor V Leiden mutation 08/12/2014    07/17/14   Past Surgical History  Procedure Laterality Date  . Right ear surgery    . Tee without cardioversion N/A 07/18/2014    Procedure: TRANSESOPHAGEAL ECHOCARDIOGRAM (TEE);  Surgeon: Chrystie Nose, MD;  Location: [facility] ENDOSCOPY;  Service: Cardiovascular;  Laterality: N/A;  . Tee without cardioversion N/A 01/13/2015    Procedure: TRANSESOPHAGEAL ECHOCARDIOGRAM (TEE);  Surgeon: Donato Schultz, MD;  Location: [facility] ENDOSCOPY;  Service: Cardiovascular;  Laterality: N/A;   Family History  Problem Relation Age of Onset  . CAD Mother   . Heart disease Mother   . Stroke Other     uncle 60s, grandfather 6s  . Stroke Paternal Grandfather   . Cancer Paternal Grandfather     lung cancer    Social History  Substance Use Topics  . Smoking status: Former Smoker -- 1.50 packs/day for 10 years    Quit date: 07/16/2014  . Smokeless tobacco: Never Used     Comment: Quit x 3 weeks  . Alcohol Use: 0.0 oz/week    0 Standard drinks or equivalent per week     Comment: Occasionally.   OB History    No data available     Review of Systems  All other systems reviewed and are negative.   Allergies  Review of patient's allergies indicates no known allergies.  Home Medications   Prior to  Admission medications   Medication Sig Start Date End Date Taking? Authorizing Provider  amLODipine (NORVASC) 2.5 MG tablet Take 2.5 mg by mouth every other day.   Yes Historical Provider, MD  hydroxychloroquine (PLAQUENIL) 200 MG tablet Take 200 mg by mouth daily.   Yes Historical Provider, MD  loratadine (CLARITIN) 10 MG tablet Take 10 mg by mouth daily as needed.    Yes Historical Provider, MD  XARELTO 20 MG TABS tablet TAKE ONE TABLET BY MOUTH ONCE DAILY WITH  SUPPER 07/27/15  Yes Marvel Plan, MD  amoxicillin (AMOXIL) 500 MG capsule Take 1 capsule (500 mg total) by mouth 2 (two) times daily. 08/21/15   Riki Sheer, PA-C  butalbital-acetaminophen-caffeine (FIORICET, ESGIC) [phone] MG per tablet Take 1 tablet by mouth every 4 (four) hours as needed for headache. No more than 2 a day and no more than 5 a week. 07/07/15   Marvel Plan, MD  citalopram (CELEXA) 10 MG tablet Take 1 tablet (10 mg total) by mouth daily. 06/09/15   [name] Cheadle, MD   Meds Ordered and Administered this Visit  Medications - No data to display  BP 116/82 mmHg  Pulse 88  Temp(Src) 100.9 F (38.3 C) (Oral)  Resp 18  SpO2 99% No data found.   Physical Exam  Constitutional: She is oriented to person, place, and time. She appears well-developed and well-nourished.  No distress.  HENT:  Head: Normocephalic and atraumatic.  Mouth/Throat: No oropharyngeal exudate.  Moderate oropharynx injection is noted. Mild swelling. No exudate  Eyes: Right eye exhibits no discharge. Left eye exhibits no discharge.  Neck: Neck supple.  Cardiovascular: Normal rate and regular rhythm.   Pulmonary/Chest: Effort normal and breath sounds normal.  Lymphadenopathy:    She has cervical adenopathy.  Neurological: She is alert and oriented to person, place, and time.  Skin: Skin is warm and dry. No rash noted. She is not diaphoretic.  Psychiatric: Her behavior is normal.  Nursing note and vitals reviewed.   ED Course  Procedures  (including critical care time)  Labs Review Labs Reviewed  POCT RAPID STREP A    Imaging Review No results found.   Visual Acuity Review  Right Eye Distance:   Left Eye Distance:   Bilateral Distance:    Right Eye Near:   Left Eye Near:    Bilateral Near:         MDM   1. Strep pharyngitis    Based on presentation, fevers, and exam cover for strep. Tylenol for fevers. F/U if needed.    Riki Sheer, PA-C 08/21/15 1744

## 2015-08-24 ENCOUNTER — Ambulatory Visit: Payer: Medicaid Other | Attending: Internal Medicine | Admitting: *Deleted

## 2015-08-24 DIAGNOSIS — Z3049 Encounter for surveillance of other contraceptives: Secondary | ICD-10-CM | POA: Diagnosis not present

## 2015-08-24 DIAGNOSIS — Z3042 Encounter for surveillance of injectable contraceptive: Secondary | ICD-10-CM | POA: Diagnosis not present

## 2015-08-24 LAB — CULTURE, GROUP A STREP

## 2015-08-24 MED ORDER — MEDROXYPROGESTERONE ACETATE 150 MG/ML IM SUSP
150.0000 mg | Freq: Once | INTRAMUSCULAR | Status: AC
  Administered 2015-08-24: 150 mg via INTRAMUSCULAR

## 2015-08-24 NOTE — ED Notes (Signed)
Final report of strep positive , treatment adequate w rx provided day of visit

## 2015-08-24 NOTE — ED Notes (Signed)
Called and left detailed message on VM regarding result, and to be sure to take the RX as written until completed

## 2015-08-24 NOTE — Progress Notes (Signed)
Patient presents for Depo Provera injection States taking antibiotic for b/l ear infection Instructed patient to use second form of birth control while on antibiotic such as abstinence or condoms with spermicide Last injection received 06/05/15 Next injection due 11/14 - 11/23/2015

## 2015-11-11 ENCOUNTER — Ambulatory Visit: Payer: Medicaid Other

## 2015-11-18 ENCOUNTER — Ambulatory Visit: Payer: Medicaid Other | Attending: Family Medicine

## 2015-11-18 VITALS — BP 108/72 | HR 98 | Temp 98.4°F | Resp 18 | Ht 62.0 in | Wt 129.0 lb

## 2015-11-18 DIAGNOSIS — Z3042 Encounter for surveillance of injectable contraceptive: Secondary | ICD-10-CM | POA: Insufficient documentation

## 2015-11-18 MED ORDER — MEDROXYPROGESTERONE ACETATE 150 MG/ML IM SUSP
150.0000 mg | Freq: Once | INTRAMUSCULAR | Status: AC
  Administered 2015-11-18: 150 mg via INTRAMUSCULAR

## 2015-11-18 NOTE — Progress Notes (Signed)
Patient here for depo injection. Patient feels good today, denies pain.  Last injection 08/24/15. Injection given today. Next injection due 02/02/15-02/16/15.

## 2015-11-18 NOTE — Patient Instructions (Signed)
Please return 02/02/15-02/16/15 for you next depo injection.

## 2015-12-24 ENCOUNTER — Encounter: Payer: Self-pay | Admitting: Family Medicine

## 2015-12-24 ENCOUNTER — Ambulatory Visit: Payer: Medicaid Other | Attending: Family Medicine | Admitting: Family Medicine

## 2015-12-24 VITALS — BP 107/64 | HR 84 | Temp 99.2°F | Resp 16 | Ht 62.0 in | Wt 131.0 lb

## 2015-12-24 DIAGNOSIS — F0631 Mood disorder due to known physiological condition with depressive features: Secondary | ICD-10-CM | POA: Diagnosis not present

## 2015-12-24 DIAGNOSIS — Z7901 Long term (current) use of anticoagulants: Secondary | ICD-10-CM | POA: Insufficient documentation

## 2015-12-24 DIAGNOSIS — Z8673 Personal history of transient ischemic attack (TIA), and cerebral infarction without residual deficits: Secondary | ICD-10-CM | POA: Insufficient documentation

## 2015-12-24 DIAGNOSIS — Z0000 Encounter for general adult medical examination without abnormal findings: Secondary | ICD-10-CM | POA: Insufficient documentation

## 2015-12-24 DIAGNOSIS — I639 Cerebral infarction, unspecified: Secondary | ICD-10-CM | POA: Diagnosis not present

## 2015-12-24 DIAGNOSIS — M329 Systemic lupus erythematosus, unspecified: Secondary | ICD-10-CM | POA: Diagnosis not present

## 2015-12-24 DIAGNOSIS — I7300 Raynaud's syndrome without gangrene: Secondary | ICD-10-CM | POA: Insufficient documentation

## 2015-12-24 DIAGNOSIS — I69398 Other sequelae of cerebral infarction: Secondary | ICD-10-CM

## 2015-12-24 DIAGNOSIS — IMO0002 Reserved for concepts with insufficient information to code with codable children: Secondary | ICD-10-CM

## 2015-12-24 MED ORDER — BUPROPION HCL ER (XL) 150 MG PO TB24: 150.0000 mg | ORAL_TABLET | Freq: Every day | ORAL | Status: DC

## 2015-12-24 NOTE — Progress Notes (Signed)
Patient ID: Cristina [name], female   DOB: 10-Sep-1986, 29 y.o.   MRN: [id number]   Subjective:  Patient ID: Cristina [name], female    DOB: 26-Nov-1986  Age: 29 y.o. MRN: [id number]  CC: Establish Care   HPI Cristina [name] presents for    1. Meet new PCP  2. Labile mood: developed irritability and tearfulness after her stroke. She has been on celexa. celexa has helped her control her mood. However, she has developed decreased sex drive. She is open to med change.  3. Lupus: managed by rheumatology, Dr. Gavin Potters with [facility], notes in care everywhere. She is  on plaquenil. Has labs done early this month, reviewed in care everywhere by me today.   4. Hx of stroke in setting of fact V leiden mutation and lupus: managed by neurology. On xarelto. No bleeding or bruising. Cr 0.9 on 12/02/2015.   5. Raynaud's: on norvasc every other day per rheumatology. Doing well this winter. No digit pain.      Outpatient Prescriptions Prior to Visit  Medication Sig Dispense Refill  . amLODipine (NORVASC) 2.5 MG tablet Take 2.5 mg by mouth every other day.    Marland Kitchen amoxicillin (AMOXIL) 500 MG capsule Take 1 capsule (500 mg total) by mouth 2 (two) times daily. 20 capsule 0  . butalbital-acetaminophen-caffeine (FIORICET, ESGIC) 50-325-40 MG per tablet Take 1 tablet by mouth every 4 (four) hours as needed for headache. No more than 2 a day and no more than 5 a week. 15 tablet 0  . citalopram (CELEXA) 10 MG tablet Take 1 tablet (10 mg total) by mouth daily. 30 tablet 5  . hydroxychloroquine (PLAQUENIL) 200 MG tablet Take 200 mg by mouth daily.    Marland Kitchen loratadine (CLARITIN) 10 MG tablet Take 10 mg by mouth daily as needed.     Carlena Hurl 20 MG TABS tablet TAKE ONE TABLET BY MOUTH ONCE DAILY WITH  SUPPER 30 tablet 12   No facility-administered medications prior to visit.    ROS Review of Systems  Constitutional: Negative for fever and chills.  Eyes: Negative for visual disturbance.  Respiratory: Negative for  shortness of breath.   Cardiovascular: Negative for chest pain.  Gastrointestinal: Negative for abdominal pain and blood in stool.  Musculoskeletal: Negative for back pain and arthralgias.  Skin: Negative for rash.  Allergic/Immunologic: Negative for immunocompromised state.  Neurological: Positive for numbness. Negative for dizziness, tremors, seizures, syncope, facial asymmetry, speech difficulty, weakness, light-headedness (slight in L forearm since stroke ) and headaches.  Hematological: Negative for adenopathy. Does not bruise/bleed easily.  Psychiatric/Behavioral: Negative for suicidal ideas and dysphoric mood.    Objective:  BP 107/64 mmHg  Pulse 84  Temp(Src) 99.2 F (37.3 C) (Oral)  Resp 16  Ht  (1.575 m)  Wt 131 lb (59.421 kg)  BMI 23.95 kg/m2  SpO2 97%  BP/Weight 12/24/2015 11/18/2015 08/21/2015  Systolic BP 107 108 116  Diastolic BP 64 72 82  Wt. (Lbs) 131 129 -  BMI 23.95 23.59 -   Physical Exam  Constitutional: She is oriented to person, place, and time. She appears well-developed and well-nourished. No distress.  HENT:  Head: Normocephalic and atraumatic.  Cardiovascular: Normal rate, regular rhythm, normal heart sounds and intact distal pulses.   Pulmonary/Chest: Effort normal and breath sounds normal.  Musculoskeletal: She exhibits no edema.  Neurological: She is alert and oriented to person, place, and time.  Skin: Skin is warm and dry. No rash noted.  Psychiatric: She has  a normal mood and affect.   Assessment & Plan:   Problem List Items Addressed This Visit    Depression due to stroke [facility](HCC) - Primary   Relevant Medications   buPROPion (WELLBUTRIN XL) 150 MG 24 hr tablet    Other Visit Diagnoses    Healthcare maintenance        Relevant Orders    Flu Vaccine QUAD 36+ mos IM (Completed)       No orders of the defined types were placed in this encounter.    Follow-up: No Follow-up on file.   Dessa PhiJosalyn Scott Fix MD

## 2015-12-24 NOTE — Patient Instructions (Signed)
Victorino DikeJennifer was seen today for establish care.  Diagnoses and all orders for this visit:  Anxiety and depression -     buPROPion (WELLBUTRIN XL) 150 MG 24 hr tablet; Take 1 tablet (150 mg total) by mouth daily.  Transition from celexa to wellbutrin to improve sex drive while maintaining control of emotions by doing the following:  When you start wellbutrin, do not take celexa that night Take celexa and wellbutrin the next night Take wellbutrin every night Take celexa every other night for first week Then celexa ever 3rd night for one week Then every 4th night for one week then stop   F/u in 4-6 weeks for pap and wellbutrin f/u  Dr. Armen PickupFunches

## 2015-12-24 NOTE — Assessment & Plan Note (Signed)
Transition from celexa to wellbutrin to improve sex drive while maintaining control of emotions by doing the following:  When you start wellbutrin, do not take celexa that night Take celexa and wellbutrin the next night Take wellbutrin every night Take celexa every other night for first week Then celexa ever 3rd night for one week Then every 4th night for one week then stop

## 2015-12-24 NOTE — Progress Notes (Signed)
Establish Care  No pain today  Tobacco user 1ppweek No suicidal thought in the past two weeks

## 2016-02-03 ENCOUNTER — Ambulatory Visit: Payer: Medicaid Other | Attending: Family Medicine | Admitting: *Deleted

## 2016-02-03 DIAGNOSIS — Z3042 Encounter for surveillance of injectable contraceptive: Secondary | ICD-10-CM

## 2016-02-03 DIAGNOSIS — Z309 Encounter for contraceptive management, unspecified: Secondary | ICD-10-CM

## 2016-02-03 MED ORDER — MEDROXYPROGESTERONE ACETATE 150 MG/ML IM SUSP
150.0000 mg | Freq: Once | INTRAMUSCULAR | Status: AC
  Administered 2016-02-03: 150 mg via INTRAMUSCULAR

## 2016-02-03 NOTE — Patient Instructions (Signed)
F/U Depo Inj between Apr 26-May 10,2017

## 2016-02-03 NOTE — Progress Notes (Signed)
Patient here for depo injection.   Last injection 11/18/2015. Injection given today. Next injection due between Apr 26-May10,2017

## 2016-02-22 ENCOUNTER — Encounter: Payer: Self-pay | Admitting: Clinical

## 2016-02-22 ENCOUNTER — Encounter: Payer: Self-pay | Admitting: Family Medicine

## 2016-02-22 ENCOUNTER — Ambulatory Visit: Payer: Medicaid Other | Attending: Family Medicine | Admitting: Family Medicine

## 2016-02-22 ENCOUNTER — Other Ambulatory Visit (HOSPITAL_COMMUNITY)
Admission: RE | Admit: 2016-02-22 | Discharge: 2016-02-22 | Disposition: A | Discharge status: Home / Self Care | Payer: Medicaid Other | Source: Ambulatory Visit | Attending: Family Medicine | Admitting: Family Medicine

## 2016-02-22 VITALS — BP 112/76 | HR 102 | Temp 99.0°F | Resp 16 | Ht 63.0 in | Wt 131.0 lb

## 2016-02-22 DIAGNOSIS — Z1272 Encounter for screening for malignant neoplasm of vagina: Secondary | ICD-10-CM | POA: Diagnosis not present

## 2016-02-22 DIAGNOSIS — N760 Acute vaginitis: Secondary | ICD-10-CM | POA: Insufficient documentation

## 2016-02-22 DIAGNOSIS — F329 Major depressive disorder, single episode, unspecified: Secondary | ICD-10-CM | POA: Insufficient documentation

## 2016-02-22 DIAGNOSIS — Z7901 Long term (current) use of anticoagulants: Secondary | ICD-10-CM | POA: Insufficient documentation

## 2016-02-22 DIAGNOSIS — Z760 Encounter for issue of repeat prescription: Secondary | ICD-10-CM | POA: Insufficient documentation

## 2016-02-22 DIAGNOSIS — Z124 Encounter for screening for malignant neoplasm of cervix: Secondary | ICD-10-CM

## 2016-02-22 DIAGNOSIS — F17210 Nicotine dependence, cigarettes, uncomplicated: Secondary | ICD-10-CM | POA: Insufficient documentation

## 2016-02-22 DIAGNOSIS — Z1151 Encounter for screening for human papillomavirus (HPV): Secondary | ICD-10-CM | POA: Diagnosis present

## 2016-02-22 DIAGNOSIS — IMO0002 Reserved for concepts with insufficient information to code with codable children: Secondary | ICD-10-CM

## 2016-02-22 DIAGNOSIS — Z01419 Encounter for gynecological examination (general) (routine) without abnormal findings: Secondary | ICD-10-CM | POA: Diagnosis present

## 2016-02-22 DIAGNOSIS — Z113 Encounter for screening for infections with a predominantly sexual mode of transmission: Secondary | ICD-10-CM | POA: Diagnosis present

## 2016-02-22 DIAGNOSIS — I639 Cerebral infarction, unspecified: Secondary | ICD-10-CM | POA: Diagnosis not present

## 2016-02-22 DIAGNOSIS — Z23 Encounter for immunization: Secondary | ICD-10-CM | POA: Diagnosis not present

## 2016-02-22 DIAGNOSIS — F419 Anxiety disorder, unspecified: Secondary | ICD-10-CM | POA: Insufficient documentation

## 2016-02-22 DIAGNOSIS — Z79899 Other long term (current) drug therapy: Secondary | ICD-10-CM | POA: Insufficient documentation

## 2016-02-22 DIAGNOSIS — Z0000 Encounter for general adult medical examination without abnormal findings: Secondary | ICD-10-CM

## 2016-02-22 DIAGNOSIS — A499 Bacterial infection, unspecified: Secondary | ICD-10-CM

## 2016-02-22 DIAGNOSIS — F0631 Mood disorder due to known physiological condition with depressive features: Secondary | ICD-10-CM

## 2016-02-22 DIAGNOSIS — B9689 Other specified bacterial agents as the cause of diseases classified elsewhere: Secondary | ICD-10-CM

## 2016-02-22 MED ORDER — HYDROXYZINE HCL 50 MG PO TABS: 50.0000 mg | ORAL_TABLET | Freq: Three times a day (TID) | ORAL | Status: DC | PRN

## 2016-02-22 NOTE — Progress Notes (Signed)
Gyn physical, no vaginal odor no discharge sexually active, no pain with intercourse F/U medication management. Rx Wellbutrin  Medication working no side effect at this time  No pain today  Tobacco user 10 cigarette per week No suicidal thoughts in the past two weeks

## 2016-02-22 NOTE — Patient Instructions (Addendum)
Jenilee was seen today for gynecologic exam and medication refill.  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  Other orders -     Tdap vaccine greater than or equal to 30yo IM   Add regular exercise, try cardio that increases your heart rate and yoga for anxiety symptoms.   F/u in 3 months for depression and anxiety  Dr. Armen Pickup

## 2016-02-22 NOTE — Progress Notes (Signed)
Subjective:  Patient ID: Cristina [name], female    DOB: 1986/07/26  Age: 30 y.o. MRN: [id number]  CC: Gynecologic Exam and Medication Refill   HPI Cristina [name] presents for    1. Pap smear: here for screening pap. Denies pelvic pain, discharge, genital sores.   2. Depression: has transitioned from celexa to wellbutrin. Sexual dysfunction has improved. She has slight worsening of anxiety with palpitations and chest heaviness at work when work gets busy and sometimes at home when dealing with her 18 yo son. She does deep breathing to reduce stress and walks sometimes. She used to have panic attacks in late teens and was able to work through them with CBT. She reports her mom had an issue with anxiety and BZ addiction.   Social History  Substance Use Topics  . Smoking status: Current Every Day Smoker -- 0.25 packs/day for 10 years    Types: Cigarettes  . Smokeless tobacco: Never Used     Comment: Quit x 3 weeks  . Alcohol Use: 0.0 oz/week    0 Standard drinks or equivalent per week     Comment: Occasionally.    Outpatient Prescriptions Prior to Visit  Medication Sig Dispense Refill  . buPROPion (WELLBUTRIN XL) 150 MG 24 hr tablet Take 1 tablet (150 mg total) by mouth daily. 30 tablet 5  . hydroxychloroquine (PLAQUENIL) 200 MG tablet Take 200 mg by mouth daily.    Marland Kitchen loratadine (CLARITIN) 10 MG tablet Take 10 mg by mouth daily as needed.     Cristina [name] 20 MG TABS tablet TAKE ONE TABLET BY MOUTH ONCE DAILY WITH  SUPPER 30 tablet 12  . amLODipine (NORVASC) 2.5 MG tablet Take 2.5 mg by mouth every other day. Reported on 02/22/2016    . citalopram (CELEXA) 10 MG tablet Take 1 tablet (10 mg total) by mouth daily. (Patient not taking: Reported on 02/22/2016) 30 tablet 5   No facility-administered medications prior to visit.    ROS Review of Systems  Constitutional: Negative for fever and chills.  Eyes: Negative for visual disturbance.  Respiratory: Negative for shortness of  breath.   Cardiovascular: Negative for chest pain.  Gastrointestinal: Negative for abdominal pain and blood in stool.  Genitourinary: Negative for vaginal bleeding, vaginal discharge, genital sores and vaginal pain.  Musculoskeletal: Negative for back pain and arthralgias.  Skin: Negative for rash.  Allergic/Immunologic: Negative for immunocompromised state.  Neurological: Positive for numbness. Negative for dizziness, tremors, seizures, syncope, facial asymmetry, speech difficulty, weakness, light-headedness (slight in L forearm since stroke ) and headaches.  Hematological: Negative for adenopathy. Does not bruise/bleed easily.  Psychiatric/Behavioral: Negative for suicidal ideas and dysphoric mood. The patient is nervous/anxious.     Objective:  BP 112/76 mmHg  Pulse 102  Temp(Src) 99 F (37.2 C) (Oral)  Resp 16  Ht  (1.6 m)  Wt 131 lb (59.421 kg)  BMI 23.21 kg/m2  SpO2 99%  BP/Weight 02/22/2016 12/24/2015 11/18/2015  Systolic BP 112 107 108  Diastolic BP 76 64 72  Wt. (Lbs) 131 131 129  BMI 23.21 23.95 23.59   Physical Exam  Constitutional: She appears well-developed and well-nourished. No distress.  Cardiovascular: Normal rate, regular rhythm, normal heart sounds and intact distal pulses.   Pulmonary/Chest: Effort normal and breath sounds normal.  Genitourinary: Uterus normal. Pelvic exam was performed with patient prone. There is no rash, tenderness or lesion on the right labia. There is no rash, tenderness or lesion on the left labia.  Cervix exhibits no motion tenderness, no discharge and no friability. Vaginal discharge found.  Musculoskeletal: She exhibits no edema.  Lymphadenopathy:       Right: No inguinal adenopathy present.       Left: No inguinal adenopathy present.  Skin: Skin is warm and dry. No rash noted.   Depression screen [facility] 2/9 02/22/2016 12/24/2015 11/18/2015 08/12/2014 07/25/2014  Decreased Interest 0 0 0 0 0  Down, Depressed, Hopeless 0 0 0 0 0    PHQ - 2 Score 0 0 0 0 0  Altered sleeping 0 - - - -  Tired, decreased energy 1 - - - -  Change in appetite 0 - - - -  Feeling bad or failure about yourself  0 - - - -  Trouble concentrating 0 - - - -  Moving slowly or fidgety/restless 0 - - - -  Suicidal thoughts 0 - - - -  PHQ-9 Score 1 - - - -   GAD 7 : Generalized Anxiety Score 02/22/2016  Nervous, Anxious, on Edge 2  Control/stop worrying 1  Worry too much - different things 1  Trouble relaxing 0  Restless 0  Easily annoyed or irritable 2  Afraid - awful might happen 0  Total GAD 7 Score 6      Assessment & Plan:   Cristina [name] was seen today for gynecologic exam and medication refill.  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  Other orders -     Tdap vaccine greater than or equal to 7yo IM   No orders of the defined types were placed in this encounter.    Follow-up: No Follow-up on file.   Dessa Phi MD

## 2016-02-22 NOTE — Assessment & Plan Note (Signed)
A; anxiety slight worsening off celexa P: Add atarax for prn use Avoid BZ give fam hx of BZ abuse in patient's mom  Advised increase exercise

## 2016-02-22 NOTE — Progress Notes (Signed)
Depression screen [facility] 2/9 02/22/2016 12/24/2015 11/18/2015 08/12/2014 07/25/2014  Decreased Interest 0 0 0 0 0  Down, Depressed, Hopeless 0 0 0 0 0  PHQ - 2 Score 0 0 0 0 0  Altered sleeping 0 - - - -  Tired, decreased energy 1 - - - -  Change in appetite 0 - - - -  Feeling bad or failure about yourself  0 - - - -  Trouble concentrating 0 - - - -  Moving slowly or fidgety/restless 0 - - - -  Suicidal thoughts 0 - - - -  PHQ-9 Score 1 - - - -    GAD 7 : Generalized Anxiety Score 02/22/2016  Nervous, Anxious, on Edge 2  Control/stop worrying 1  Worry too much - different things 1  Trouble relaxing 0  Restless 0  Easily annoyed or irritable 2  Afraid - awful might happen 0  Total GAD 7 Score 6

## 2016-02-22 NOTE — Assessment & Plan Note (Signed)
A; depression is well controlled. Transitioned from celexa to wellbutrin to help with sexual dysfunction P: Continue wellbutrin

## 2016-02-23 LAB — CERVICOVAGINAL ANCILLARY ONLY
CHLAMYDIA, DNA PROBE: NEGATIVE
NEISSERIA GONORRHEA: NEGATIVE
Wet Prep (BD Affirm): POSITIVE — AB

## 2016-02-23 LAB — CYTOLOGY - PAP

## 2016-02-23 MED ORDER — FLUCONAZOLE 150 MG PO TABS: 150.0000 mg | ORAL_TABLET | Freq: Once | ORAL | Status: DC

## 2016-02-23 MED ORDER — METRONIDAZOLE 500 MG PO TABS: 500.0000 mg | ORAL_TABLET | Freq: Two times a day (BID) | ORAL | Status: AC

## 2016-02-23 NOTE — Addendum Note (Signed)
Addended by: Dessa Phi on: 02/23/2016 08:52 AM   Modules accepted: Orders, SmartSet

## 2016-04-22 ENCOUNTER — Other Ambulatory Visit: Payer: Self-pay | Admitting: Physician Assistant

## 2016-04-22 ENCOUNTER — Ambulatory Visit: Payer: Medicaid Other | Attending: Family Medicine

## 2016-04-22 VITALS — Temp 98.2°F | Ht 63.0 in | Wt 128.0 lb

## 2016-04-22 DIAGNOSIS — Z3049 Encounter for surveillance of other contraceptives: Secondary | ICD-10-CM | POA: Diagnosis present

## 2016-04-22 MED ORDER — MEDROXYPROGESTERONE ACE-LIDO 150-1 MG/ML-% IM SUSP
1.0000 | Freq: Once | INTRAMUSCULAR | Status: AC
  Administered 2016-04-22: 1 via INTRAMUSCULAR

## 2016-04-22 NOTE — Progress Notes (Signed)
Patient's here for Depo injection.  Patient last injection was 02/03/2016, which falls within the window for next injection. No pregnancy test required per protocol.  Next injection is Jul 14th-28th, 2017.  Patient is up-to-date with current Pap.  Patient tolerated injection well.

## 2016-04-22 NOTE — Patient Instructions (Signed)
Please return between July 14th-July 28th for next injection.

## 2016-06-29 ENCOUNTER — Other Ambulatory Visit: Payer: Self-pay | Admitting: Family Medicine

## 2016-07-06 ENCOUNTER — Ambulatory Visit (INDEPENDENT_AMBULATORY_CARE_PROVIDER_SITE_OTHER): Payer: Medicaid Other | Admitting: Neurology

## 2016-07-06 ENCOUNTER — Encounter: Payer: Self-pay | Admitting: Neurology

## 2016-07-06 VITALS — BP 100/63 | HR 86 | Ht 62.0 in | Wt 126.0 lb

## 2016-07-06 DIAGNOSIS — M3211 Endocarditis in systemic lupus erythematosus: Secondary | ICD-10-CM | POA: Diagnosis not present

## 2016-07-06 DIAGNOSIS — R7689 Other specified abnormal immunological findings in serum: Secondary | ICD-10-CM | POA: Insufficient documentation

## 2016-07-06 DIAGNOSIS — G43709 Chronic migraine without aura, not intractable, without status migrainosus: Secondary | ICD-10-CM

## 2016-07-06 DIAGNOSIS — R768 Other specified abnormal immunological findings in serum: Secondary | ICD-10-CM | POA: Diagnosis not present

## 2016-07-06 DIAGNOSIS — I63311 Cerebral infarction due to thrombosis of right middle cerebral artery: Secondary | ICD-10-CM | POA: Insufficient documentation

## 2016-07-06 DIAGNOSIS — F17200 Nicotine dependence, unspecified, uncomplicated: Secondary | ICD-10-CM | POA: Insufficient documentation

## 2016-07-06 DIAGNOSIS — M329 Systemic lupus erythematosus, unspecified: Secondary | ICD-10-CM | POA: Diagnosis not present

## 2016-07-06 MED ORDER — ASPIRIN EC 325 MG PO TBEC: 325.0000 mg | DELAYED_RELEASE_TABLET | Freq: Every day | ORAL | Status: DC

## 2016-07-06 MED ORDER — BUTALBITAL-APAP-CAFF-COD 50-325-40-30 MG PO CAPS: 1.0000 | ORAL_CAPSULE | Freq: Two times a day (BID) | ORAL | Status: DC | PRN

## 2016-07-06 NOTE — Patient Instructions (Addendum)
-   discontinue Xarelto and start ASA 325mg  for stroke prevention.  - may consider to check hypercoagulable labs again next visit. Repeat TEE if indicated. - continue plaqenil and wellbutrin. - continue to follow up with rheumatologist and decide on the medication in 11/2016. - Follow up with your primary care physician for stroke risk factor modification. Recommend maintain blood pressure goal <130/80, diabetes with hemoglobin A1c goal below 7.0% and lipids with LDL cholesterol goal below 70 mg/dL.  - quit smoking. - follow up in one year.

## 2016-07-06 NOTE — Progress Notes (Signed)
STROKE NEUROLOGY FOLLOW UP NOTE  NAME: Cristina [name] DOB: 1986-06-18  REASON FOR VISIT: stroke follow up HISTORY FROM: pt and chart  Today we had the pleasure of seeing Cristina [name] in follow-up at our Neurology Clinic. Pt was accompanied by no one.   History Summary Cristina [name] is a 30 y.o. female presenting with left arm weakness and facial droop. Initial MRI showed right MCA scattered embolic infarcts, and then repeat MRI showed bilateral MCA embolic stroke, consistent with cardioembolic. Patient does have a history of cigarette smoking, OCP use and migraines. Cristina [name] does report a headache the day prior to admission. However, further extensive work up showed no DVT, no PFO, and negative CTA and CUS. But on TEE exam, it was found that Cristina [name] had mobile vegetation at her MV, concerning for infectious endocarditis or Libman-Sacks endocarditis. ID consulted but culture negative and other work up also negative. No Abx started and infectious endocarditis felt not likely. Her symptoms improved a lot and Cristina [name] was discharged with ASA 325 and low dose lipitor for stroke prevention.  Follow up 09/19/14 - the patient has been doing well. Cristina [name] had no recurrent strokes and her left arm and hand function much improved. Cristina [name] went to see ID and was discharged from their clinic and Cristina [name] was referred to see hematologist Dr. 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 Requesting to go back to work.    Follow up 10/30/14 - Cristina [name] has been doing well most of the part. However, Cristina [name] had one episode of feeling generalized weakness for 2 days without other neurological symptoms. Since stroke, Cristina [name] noticed that Cristina [name] has condition of episodic  fingertip pallor and numbness, initially in the left hand but now also in right hand, and recently happens daily. Cristina [name] went to rheumatology consult two weeks ago but denied due to late on the appointment. Cristina [name] stated that Cristina [name] is going to see a rheumatologist in [place].   Follow up 01/06/15 - we decided to put her on anticoagulation (coumadin) as her MV mobile vegetation and suspected lupus put her on high risk of recurrent embolic strokes. Unfortunately, her INR never therapeutic although Cristina [name] claims compliance. Cristina [name] went to see Dr. Clide Dales in [facility] for lupus, and Cristina [name] was put on low dose amlodipine QOD for Reynold's and also plaquenil for lupus. Cristina [name] also saw Dr. Daleen Squibb from cardiology and recommended no anticoagulation. Cristina [name] was taken off coumadin and currently on ASA 325. Cristina [name] also got depo-progesterone for contraception as pregnancy may pose big risk for her and her fetus due to condition of lupus.  Follow up 07/07/15 - Cristina [name] had repeat TEE which showed resolution of the vegetation at MV. Discussed with Dr. Oneita Kras and he feels anticoagulation is needed. Due to difficulty with INR, Cristina [name] was started on Xarelto and Cristina [name] tolerated well. Cristina [name] also continued to follow up with rheumatology Dr. Clide Dales and continued on norvasc and plaquenil. Cristina [name] recently had some blurry vision and was seen by ophthalmology and ruled out plaquenil side effect. Her Reynold's is better. Cristina [name] is on celexa for depression. Cristina [name] has medicaid.    Interval History During the interval time, Cristina [name] has been doing well. No stroke like symptoms. Cristina [name] still on Xarelto. Cristina [name] follows with Dr. Clide Dales in 05/2016 and  repeat serology negative, and he does not think pt needs to be medicated but will make final decision next visit in 11/2016. Her celexa was changed to wellbutrin due to side effect of decreased sexual drive. BP 100/63, off norvasc. HA on PRN fioricet and reported effective. Cristina [name] started again light smoking.   REVIEW OF SYSTEMS: Full 14  system review of systems performed and notable only for those listed below and in HPI above, all others are negative:  Constitutional: N/A  Cardiovascular: N/A  Ear/Nose/Throat:  Skin:  Eyes:  Respiratory: N/A  Gastroitestinal: constipation  Genitourinary: N/A Hematology/Lymphatic: N/A  Endocrine: N/A  Musculoskeletal:  Allergy/Immunology: N/A  Neurological: headache Psychiatric: anxious, nervous, depression  The following represents the patient's updated allergies and side effects list: No Known Allergies  Labs since last visit of relevance include the following: Results for orders placed or performed in visit on 02/22/16  Cytology - PAP [place]  Result Value Ref Range   CYTOLOGY - PAP PAP RESULT   Cervicovaginal ancillary only  Result Value Ref Range   Wet Prep (BD Affirm) **POSITIVE for Gard[place]rella** (A)   Cervicovaginal ancillary only  Result Value Ref Range   Chlamydia Negative    Neisseria gonorrhea Negative     The neurologically relevant items on the patient's problem list were reviewed on today's visit.  Neurologic Examination  A problem focused neurological exam (12 or more points of the single system neurologic examination, vital signs counts as 1 point, cranial nerves count for 8 points) was performed.  Blood pressure 100/63, pulse 86, height 5\' 2"  (1.575 m), weight 126 lb (57.153 kg).  General - Well nourished, well developed, in no apparent distress.  Ophthalmologic - Sharp disc margins OU.  Cardiovascular - Regular rate and rhythm with no murmur.  Mental Status -  Level of arousal and orientation to time, place, and person were intact. Language including expression, naming, repetition, comprehension, reading, and writing was assessed and found intact. Attention span and concentration were normal. Recent and remote memory were intact. Fund of Knowledge was assessed and was intact.  Cranial Nerves II - XII - II - Visual field intact OU. III,  IV, VI - Extraocular movements intact. V - Facial sensation intact bilaterally. VII - Facial movement intact bilaterally. VIII - Hearing & vestibular intact bilaterally. X - Palate elevates symmetrically. XI - Chin turning & shoulder shrug intact bilaterally. XII - Tongue protrusion intact.  Motor Strength - The patient's strength was normal in all extremities and pronator drift was absent.  Bulk was normal and fasciculations were absent.   Motor Tone - Muscle tone was assessed at the neck and appendages and was normal.  Reflexes - The patient's reflexes were normal in all extremities and Cristina [name] had no pathological reflexes.  Sensory - Light touch, temperature/pinprick, vibration and proprioception, and Romberg testing were assessed and were normal.    Coordination - The patient had normal movements in the hands and feet with no ataxia or dysmetria.  Tremor was absent.  Gait and Station - The patient's transfers, posture, gait, station, and turns were observed as normal.  Data reviewed: I personally reviewed the images and agree with the radiology interpretations.  CT Angio Head 07/16/2014 Intermittent occlusion of right middle cerebral artery insular branch, which could reflect thromboembolic disease or possible vasculopathy. In addition, relative asymmetric, decreased distal right anterior cerebral artery vessels, equivocal for clinically significant finding, could reflect normal variant. Findings may be better characterized on MRI of the head with  and without contrast including gradient sequences. Right parietal transcortical encephalomalacia may reflect remote infarct or possibly head trauma.   CT Angio Neck 07/16/2014 Normal CT angiogram of the neck.   CT of the brain 07/16/2014 Negative CT head.   MRI of the brain without contrast 07/16/2014 Numerous punctate acute infarctions in the right posterior temporal and parietal region consistent with micro embolic infarctions in the right middle  cerebral artery territory.   MRI of the brain  07/17/2014  1. Interval progression of right MCA infarcts. Right operculum and parietal lobe confluent [name] affected now. Small cortical infarcts in the peri-Rolandic region.  2. New left superior frontal gyrus, left ACA - left MCA watershed type, infarct. Small right occipital lacunar infarcts, possibly also  in the posterior left temporal lobe.  3. The constellation of recent CTA and MRI data suggests a proximal aortic or cardiac source of emboli.  4. No mass effect or hemorrhage. No abnormal enhancement.  5. Major vascular flow voids appear stable to the recent CTA, including evidence of abnormal flow in the right MCA dominant  posterior sylvian branch.   Lower extremity Dopplers  07/17/2014  No evidence of deep vein or superficial thrombosis involving the right lower extremity and left lower extremity. - . Incidental findings are consistent with: Baker's Cyst on the left. - No evidence of Baker&'s cyst on the right.   2D Echocardiogram - ejection fraction 55-60%. No cardiac source of emboli identified.   CXR - No active cardiopulmonary disease.   EKG - sinus tachycardia. For complete results please see formal report.   TCD with bubble  07/17/2014  Impression: negative Transcranial Doppler Bubble Study indicative of NO right to left shunt.    Hypercoagulable Panel  ANA - positive x 2 dsDNA - 5 - indeterminate Anti-smith - neg Rheumatoid factor - < 10  ANCA screen - negative  Sojourn syndrome A and B - negative  C3 complement - 88 low  C4 complement - 18 within normal limits  HIV - nonreactive  Antithrombin III - 85 within normal limits  Serum homocystine - 11.7 within normal limits  RPR - nonreactive  Sedimentation rate - 11 within normal limits  Antinuclear body titer - negative  Total complement - 52, normal  Protein C activity - 114 within normal limits  Protein C total - 76 low normal  Protein S. activity - 61 slightly  low  Protein S. 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 Small mobile vegetation on the tip of the anterior mitral leaflet - this could represent endocarditis or Libman-Sacks endocarditis.  2. No LAA thrombus  3. No PFO or atrial septal defect  4. Normal LV function and wall motion  TEE 01/13/15 - Left ventricle: Systolic function was normal. The estimated ejection fraction was in the range of 55% to 60%. Wall motion was normal; there were no regional wall motion abnormalities. - Mitral valve: There was mild regurgitation. No vegetation (as was seen on prior TEE). - Left atrium: No evidence of thrombus in the atrial cavity or appendage. - Right atrium: No evidence of thrombus in the atrial cavity or appendage. - Atrial septum: No defect or  patent foramen ovale was identified.  LDL 40 and A1C 5.5  Assessment: As you may recall, Cristina [name] is a 30 y.o. Caucasian female with PMH of migraine, OCP use and smoker admitted in 06/2014 due to bilaterally MCA stroke due to cardioemboli. Extensive work up done as listed above, but most likely etiology is related to the small mobile vegetation in MV found on TEE. ID ruled out infectious endocarditis. 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 Cristina [name] was put on amlodipine QOD and plaquenil.   Cristina [name] was put on coumadin due to emblic stroke and mobile MV vegetation showing on TEE. Unfortunately, INR never therapeutic. Cristina [name] saw Dr. Daleen SquibbWall from cardiology and was taken off anticoagulation and put on ASA 325mg . Cristina [name] was also received depo-progesterone for contraception.     Cristina [name] was smoker and taken OCP and has slightly low protein S and heterozygous factor V mutation. However, that is only exclusive related to venous clotting disorder. Pt has no PFO on TCD bubble study, so these risk factors can not explain her bilateral cardioembolic MCA strokes. 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 However, repeat her TEE showed resolution of the vegetation. Cristina [name] was seen by Dr. Oneita KrasGranfantuna and considered still need anticoagulation. Due to difficulty with INR, Cristina [name] was changed to Xarelto. Cristina [name] tolerated well so far. However, how long [facility] [name] need [facility] is still debatable.   Cristina [name] follows with Dr. Clide DalesKernoldo and on plequenil but repeat serology was negative as per note. Cristina [name] may not need plequenil in the near future. At this time, I would take her off Xarelto and put on ASA full dose. If Cristina [name] is off plequenil in 11/2016 after her visit with Dr. Clide DalesKernoldo, we will check her serology and hypercoagulable labs again next visit. If indicated, will repeat TEE.   Plan:  - discontinue Xarelto and start ASA 325mg  for stroke prevention.  - may consider to check hypercoagulable labs again next visit. Repeat TEE if indicated. - continue plaqenil and wellbutrin. - continue to follow up with rheumatologist and decide on the medication in 11/2016. - Follow up with your primary care physician for stroke risk factor modification. Recommend maintain blood pressure goal <130/80, diabetes with hemoglobin A1c goal below 7.0% and lipids with LDL cholesterol goal below 70 mg/dL.  - quit smoking. - follow up in one year.  I spent more than 25 minutes of face to face time with the patient. Greater than 50% of time was spent in counseling and coordination of care. We discussed about decision on anticoagulation, follow up with rheumatology and repeat labs next visit.   No orders of  the defined types were placed in this encounter.    Patient Instructions  - discontinue Xarelto and start ASA 325mg  for stroke prevention.  - may consider to check hypercoagulable labs again next visit. Repeat TEE if indicated. - continue plaqenil and wellbutrin. - continue to follow up with rheumatologist and decide on the medication in 11/2016. - Follow up with your primary care physician for stroke risk factor modification. Recommend maintain blood pressure goal <130/80, diabetes with hemoglobin A1c goal below 7.0% and lipids with LDL cholesterol goal below 70 mg/dL.  - quit smoking. - follow up in one year.    Marvel PlanJindong Mclane Arora, MD PhD [facility] [address], [phone]  [place], [place] [address] [phone]

## 2016-07-11 ENCOUNTER — Ambulatory Visit: Payer: Medicaid Other | Attending: Family Medicine | Admitting: Family Medicine

## 2016-07-11 ENCOUNTER — Encounter: Payer: Self-pay | Admitting: Family Medicine

## 2016-07-11 VITALS — BP 105/70 | HR 85 | Temp 99.2°F | Resp 18 | Ht 63.0 in | Wt 127.8 lb

## 2016-07-11 DIAGNOSIS — Z7982 Long term (current) use of aspirin: Secondary | ICD-10-CM | POA: Insufficient documentation

## 2016-07-11 DIAGNOSIS — F0631 Mood disorder due to known physiological condition with depressive features: Secondary | ICD-10-CM

## 2016-07-11 DIAGNOSIS — Z79899 Other long term (current) drug therapy: Secondary | ICD-10-CM | POA: Diagnosis not present

## 2016-07-11 DIAGNOSIS — I639 Cerebral infarction, unspecified: Secondary | ICD-10-CM | POA: Diagnosis not present

## 2016-07-11 DIAGNOSIS — IMO0002 Reserved for concepts with insufficient information to code with codable children: Secondary | ICD-10-CM

## 2016-07-11 DIAGNOSIS — F329 Major depressive disorder, single episode, unspecified: Secondary | ICD-10-CM | POA: Insufficient documentation

## 2016-07-11 DIAGNOSIS — F17210 Nicotine dependence, cigarettes, uncomplicated: Secondary | ICD-10-CM | POA: Diagnosis not present

## 2016-07-11 MED ORDER — BUPROPION HCL ER (XL) 150 MG PO TB24: 150.0000 mg | ORAL_TABLET | Freq: Every day | ORAL | Status: DC

## 2016-07-11 NOTE — Patient Instructions (Addendum)
Cristina [name] was seen today for depression.  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  1 year refill on wellbutrin Your depression and anxiety are doing well  F/u in 6 months for depression check up, sooner if needed  Dr. Armen PickupFunches

## 2016-07-11 NOTE — Progress Notes (Signed)
Patent is here for FU DEPRESSION  Patient denies any pain at this time.  Patient takes medication at night. Patent has eaten today.  Patient has no concerns with new medication.

## 2016-07-11 NOTE — Progress Notes (Signed)
Subjective:  Patient ID: Barrie Lyme, female    DOB: 1986/12/24  Age: 30 y.o. MRN: [id number]  CC: Depression   HPI LUCIANA CAMMARATA has lupus, hx of CVA, migraine and depression presents for   1. Depression: doing well. Compliant with wellbutrin. Denies significant sexual dysfunction, weight gain or SI. In need of medication refill.   Social History  Substance Use Topics  . Smoking status: Current Some Day Smoker -- 0.25 packs/day for 10 years    Types: Cigarettes  . Smokeless tobacco: Never Used     Comment: pack will last her two to three days  . Alcohol Use: 0.6 oz/week    0 Standard drinks or equivalent, 1 Shots of liquor per week     Comment: Occasionally.    Outpatient Prescriptions Prior to Visit  Medication Sig Dispense Refill  . aspirin EC 325 MG tablet Take 1 tablet (325 mg total) by mouth daily. 30 tablet 0  . buPROPion (WELLBUTRIN XL) 150 MG 24 hr tablet Take 1 tablet (150 mg total) by mouth daily. Must have office visit for refills 30 tablet 0  . butalbital-acetaminophen-caffeine (FIORICET WITH CODEINE) 50-325-40-30 MG capsule Take 1 capsule by mouth every 12 (twelve) hours as needed for headache. 30 capsule 1  . hydroxychloroquine (PLAQUENIL) 200 MG tablet Take 200 mg by mouth daily.    . hydrOXYzine (ATARAX/VISTARIL) 50 MG tablet Take 1 tablet (50 mg total) by mouth 3 (three) times daily as needed for anxiety. 30 tablet 0  . loratadine (CLARITIN) 10 MG tablet Take 10 mg by mouth daily as needed.      No facility-administered medications prior to visit.    ROS Review of Systems  Constitutional: Negative for fever and chills.  Eyes: Negative for visual disturbance.  Respiratory: Negative for shortness of breath.   Cardiovascular: Negative for chest pain.  Gastrointestinal: Negative for abdominal pain and blood in stool.  Musculoskeletal: Negative for back pain and arthralgias.  Skin: Negative for rash.  Allergic/Immunologic: Negative for  immunocompromised state.  Hematological: Negative for adenopathy. Does not bruise/bleed easily.  Psychiatric/Behavioral: Negative for suicidal ideas and dysphoric mood.    Objective:  BP 105/70 mmHg  Pulse 85  Temp(Src) 99.2 F (37.3 C) (Oral)  Resp 18  Ht  (1.6 m)  Wt 127 lb 12.8 oz (57.97 kg)  BMI 22.64 kg/m2  SpO2 99%  BP/Weight 07/11/2016 07/06/2016 04/22/2016  Systolic BP 105 100 -  Diastolic BP 70 63 -  Wt. (Lbs) 127.8 126 128  BMI 22.64 23.04 22.68   Physical Exam  Constitutional: She is oriented to person, place, and time. She appears well-developed and well-nourished. No distress.  HENT:  Head: Normocephalic and atraumatic.  Cardiovascular: Normal rate, regular rhythm, normal heart sounds and intact distal pulses.   Pulmonary/Chest: Effort normal and breath sounds normal.  Musculoskeletal: She exhibits no edema.  Neurological: She is alert and oriented to person, place, and time.  Skin: Skin is warm and dry. No rash noted.  Psychiatric: She has a normal mood and affect.   Depression screen [facility] 2/9 07/11/2016 02/22/2016 12/24/2015  Decreased Interest 0 0 0  Down, Depressed, Hopeless 0 0 0  PHQ - 2 Score 0 0 0  Altered sleeping 1 0 -  Tired, decreased energy 1 1 -  Change in appetite 0 0 -  Feeling bad or failure about yourself  0 0 -  Trouble concentrating 0 0 -  Moving slowly or fidgety/restless 0 0 -  Suicidal thoughts 0 0 -  PHQ-9 Score 2 1 -    GAD 7 : Generalized Anxiety Score 07/11/2016 02/22/2016  Nervous, Anxious, on Edge 0 2  Control/stop worrying 0 1  Worry too much - different things 0 1  Trouble relaxing 1 0  Restless 1 0  Easily annoyed or irritable 1 2  Afraid - awful might happen 0 0  Total GAD 7 Score 3 6      Assessment & Plan:  Victorino DikeJennifer was seen today for depression.  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   There are no diagnoses linked to this encounter.  No orders of the defined types were placed in this encounter.    Follow-up: Return in about 6 months (around 01/11/2017) for depression .   Dessa PhiJosalyn Shrihaan Porzio MD

## 2016-07-11 NOTE — Progress Notes (Signed)
Patient ID: Cristina LymeJennifer L [name], female   DOB: 10/20/1986, 30 y.o.   MRN: [id number]

## 2016-07-12 ENCOUNTER — Telehealth: Payer: Self-pay

## 2016-07-12 NOTE — Telephone Encounter (Signed)
Rn call patient to find if any other provider prescribed her medication for headaches. PT stated she has been seeing Dr. Roda ShuttersXu since 2015 and 2016 was the first year he prescribed  meds for headaches. Rn stated it was approve last year, but every year they have to update and make changes to their medicaid guidelines. Rn stated it will probably not get approve and message will be sent to Dr. Roda ShuttersXu for another first line medication for headache. PT verbalized understand.

## 2016-07-12 NOTE — Telephone Encounter (Signed)
Rn call [place] [facility] at [phone] abuut PA for floricet. The rep ask if patient has tried and failed two other medications. Rn stated the patient was prescribed floricet last year in 2016. Rn stated there is no other medications listed that has been tried. REp stated to call patient to find out what she has tried in the past.

## 2016-07-13 NOTE — Assessment & Plan Note (Signed)
Well controlled on wellbutrin Continue current regimen

## 2016-07-15 ENCOUNTER — Ambulatory Visit: Payer: Medicaid Other | Attending: Family Medicine

## 2016-07-15 DIAGNOSIS — Z309 Encounter for contraceptive management, unspecified: Secondary | ICD-10-CM

## 2016-07-15 MED ORDER — MEDROXYPROGESTERONE ACETATE 150 MG/ML IM SUSP: 150.0000 mg | Freq: Once | INTRAMUSCULAR | Status: DC

## 2016-07-15 NOTE — Progress Notes (Signed)
   Patient presents for Depo Provera injection States feeling well Last injection received 04/22/16. Depo Provera given IM RUOQ. Patient tolerated well. Next injection due 09/30/2016-10/14/2016 Reminder card given  Medroxyprogesterone acetate injectable suspension 150MG   ManufacutureFrancisca [name]: [facility] [id number] Lot: [id number], exp 11/2019.Pollyann KennedyKim Reisa Coppola, RN, BSN

## 2016-07-15 NOTE — Telephone Encounter (Signed)
Rn call [place] [facility] about PA for for Floricet. Rn stated patient has not tried tramadol, excedrin, but has tried tylenol. Rep stated pt has to fail tow other medications for this to be approve. PA sent for approval  Ref number is [id number]. 24 to 48 hours for a decision.

## 2016-07-18 NOTE — Telephone Encounter (Signed)
Thanks, Katrina.  Marvel Plan, MD PhD Stroke Neurology 07/18/2016 12:38 PM

## 2016-07-19 NOTE — Telephone Encounter (Signed)
Thanks, Corrie Dandy. I do not think we would like to try other medications in order to get the medication we preferred. I will discuss with pt. Thanks again.  Marvel Plan, MD PhD Stroke Neurology 07/19/2016 6:41 PM

## 2016-07-19 NOTE — Telephone Encounter (Signed)
Spoke with [place], [place] [facility] who stated PA denied for fioricet. Transferred to pharmacist, Patty. She stated patient must try  X 30 days and fail two of three listed: Tylenol w codeine tab,  generic Ultram, generic Ultracet, generic If she  tries and fails two of these, Fioricet will be approved.  Will route this to Dr Roda Shutters.

## 2016-10-05 ENCOUNTER — Ambulatory Visit: Payer: Medicaid Other | Attending: Family Medicine | Admitting: *Deleted

## 2016-10-05 DIAGNOSIS — Z308 Encounter for other contraceptive management: Secondary | ICD-10-CM

## 2016-10-05 DIAGNOSIS — Z3042 Encounter for surveillance of injectable contraceptive: Secondary | ICD-10-CM | POA: Diagnosis not present

## 2016-10-05 MED ORDER — MEDROXYPROGESTERONE ACETATE 150 MG/ML IM SUSP
150.0000 mg | Freq: Once | INTRAMUSCULAR | Status: AC
  Administered 2016-10-05: 150 mg via INTRAMUSCULAR

## 2016-10-05 NOTE — Patient Instructions (Signed)
Thank you for visiting the clinic today. Please return during the dates of December 27- January 10 to have your next Depo injection completed.

## 2016-10-05 NOTE — Progress Notes (Signed)
Patient denies any pain or concerns at this time. Patient has presented to the clinic during her time period therefore no PT is required. Patient received injection in the Left Upper Outer Quadrant. Patient tolerated injection well today.

## 2016-12-23 ENCOUNTER — Ambulatory Visit: Payer: Medicaid Other | Attending: Family Medicine | Admitting: *Deleted

## 2016-12-23 DIAGNOSIS — Z3042 Encounter for surveillance of injectable contraceptive: Secondary | ICD-10-CM | POA: Diagnosis not present

## 2016-12-23 MED ORDER — MEDROXYPROGESTERONE ACETATE 150 MG/ML IM SUSP
150.0000 mg | Freq: Once | INTRAMUSCULAR | Status: AC
  Administered 2016-12-23: 150 mg via INTRAMUSCULAR

## 2016-12-23 NOTE — Progress Notes (Signed)
      Date last pap:02/23/16  Last Depo-Provera: 10/05/16  Depo-Provera 150 mg IM given Rt upper outer quadrant T.Benjaminm,RN. Pt tolerated well.   Next appointment due : pt given reminder card to return for Depo-Provera between March 16-  March 25, 2017. Informed of new office hours.

## 2017-03-27 ENCOUNTER — Ambulatory Visit: Payer: 59 | Attending: Family Medicine | Admitting: *Deleted

## 2017-03-27 DIAGNOSIS — Z3042 Encounter for surveillance of injectable contraceptive: Secondary | ICD-10-CM | POA: Insufficient documentation

## 2017-03-27 MED ORDER — MEDROXYPROGESTERONE ACETATE 150 MG/ML IM SUSP
150.0000 mg | Freq: Once | INTRAMUSCULAR | Status: AC
  Administered 2017-03-27: 150 mg via INTRAMUSCULAR

## 2017-03-27 NOTE — Progress Notes (Signed)
Date last pap:02/22/2016 Last Depo-Provera: 12/23/2016  Side Effects if any: n/a Urine HCG indicated- negative  Cautioned that if she had intercourse in the last two weeks she could be pregnant even though the test shows a negative result. Explained this could have a negative effect on the fetus. States she wants the injection. Verbalized understanding and wishes to proceed.   Depo-Provera 150 mg IM given in left upper outer quadrant by T. Sharlet Salina, RN Next appointment due June 18 through June 26, 2017. Pt given reminder card to make appointment between dates of June 18 through July 2.

## 2017-05-10 ENCOUNTER — Encounter: Payer: Self-pay | Admitting: Family Medicine

## 2017-06-05 ENCOUNTER — Ambulatory Visit: Payer: 59 | Attending: Family Medicine | Admitting: Family Medicine

## 2017-06-05 ENCOUNTER — Encounter: Payer: Self-pay | Admitting: Family Medicine

## 2017-06-05 VITALS — BP 114/77 | HR 105 | Temp 98.1°F | Wt 127.4 lb

## 2017-06-05 DIAGNOSIS — X58XXXA Exposure to other specified factors, initial encounter: Secondary | ICD-10-CM | POA: Insufficient documentation

## 2017-06-05 DIAGNOSIS — Z3042 Encounter for surveillance of injectable contraceptive: Secondary | ICD-10-CM | POA: Diagnosis not present

## 2017-06-05 DIAGNOSIS — F419 Anxiety disorder, unspecified: Secondary | ICD-10-CM | POA: Diagnosis not present

## 2017-06-05 DIAGNOSIS — F0631 Mood disorder due to known physiological condition with depressive features: Secondary | ICD-10-CM | POA: Diagnosis not present

## 2017-06-05 DIAGNOSIS — Z309 Encounter for contraceptive management, unspecified: Secondary | ICD-10-CM

## 2017-06-05 DIAGNOSIS — H9201 Otalgia, right ear: Secondary | ICD-10-CM | POA: Insufficient documentation

## 2017-06-05 DIAGNOSIS — I69398 Other sequelae of cerebral infarction: Secondary | ICD-10-CM

## 2017-06-05 DIAGNOSIS — S70361A Insect bite (nonvenomous), right thigh, initial encounter: Secondary | ICD-10-CM | POA: Diagnosis not present

## 2017-06-05 DIAGNOSIS — F17210 Nicotine dependence, cigarettes, uncomplicated: Secondary | ICD-10-CM | POA: Insufficient documentation

## 2017-06-05 DIAGNOSIS — Z7982 Long term (current) use of aspirin: Secondary | ICD-10-CM | POA: Insufficient documentation

## 2017-06-05 LAB — POCT URINE PREGNANCY: Preg Test, Ur: NEGATIVE

## 2017-06-05 MED ORDER — MEDROXYPROGESTERONE ACETATE 150 MG/ML IM SUSP
150.0000 mg | Freq: Once | INTRAMUSCULAR | Status: AC
  Administered 2017-06-05: 150 mg via INTRAMUSCULAR

## 2017-06-05 MED ORDER — BUPROPION HCL ER (XL) 300 MG PO TB24: 300.0000 mg | ORAL_TABLET | Freq: Every day | ORAL | 11 refills | Status: DC

## 2017-06-05 NOTE — Patient Instructions (Signed)
Cristina [name] was seen today for annual exam.  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  call if you develop a rash or fever since you noticed a tick bite yesterday. Call if you develop R ear pain for treatment of acute otitis externa   F/u in 6 months for depression sooner if needed  Dr. Armen PickupFunches

## 2017-06-05 NOTE — Assessment & Plan Note (Signed)
Negative U preg Depo provera shot today

## 2017-06-05 NOTE — Progress Notes (Signed)
SUBJECTIVE:  31 y.o. female for annual checkup. She complains of slight anxiety and irritability. She request Wellbutrin dose increase. She is overdue for depo shot. She desires to continue depo shot. She has 1, 225 yo son. She does not have menstrual periods on depo.   She reports tick bite on R upper thigh yesterday. It was slightly enlarged. There is mild redness at site. No swelling, fever, or rash.   She reports R ear pain 2 months ago. She has history of ruptured R TM and gets otitis externa often. She denies pain or drainage currently.   Social History  Substance Use Topics  . Smoking status: Current Some Day Smoker    Packs/day: 0.25    Years: 10.00    Types: Cigarettes  . Smokeless tobacco: Never Used     Comment: pack will last her two to three days  . Alcohol use 0.6 oz/week    1 Shots of liquor per week     Comment: Occasionally.   Current Outpatient Prescriptions  Medication Sig Dispense Refill  . buPROPion (WELLBUTRIN XL) 150 MG 24 hr tablet Take 1 tablet (150 mg total) by mouth daily. 90 tablet 3  . butalbital-acetaminophen-caffeine (FIORICET WITH CODEINE) 50-325-40-30 MG capsule Take 1 capsule by mouth every 12 (twelve) hours as needed for headache. 30 capsule 1  . hydroxychloroquine (PLAQUENIL) 200 MG tablet Take 200 mg by mouth daily.    Marland Kitchen. loratadine (CLARITIN) 10 MG tablet Take 10 mg by mouth daily as needed.     Marland Kitchen. aspirin EC 325 MG tablet Take 1 tablet (325 mg total) by mouth daily. 30 tablet 0   Current Facility-Administered Medications  Medication Dose Route Frequency Provider Last Rate Last Dose  . medroxyPROGESTERone (DEPO-PROVERA) injection 150 mg  150 mg Intramuscular Once Dessa PhiFunches, Brittany Osier, MD       Allergies: Patient has no known allergies.  No LMP recorded. Patient has had an injection.  ROS:  Feeling well. No dyspnea or chest pain on exertion.  No abdominal pain, change in bowel habits, black or bloody stools.  No urinary tract symptoms. GYN ROS: no  breast pain or new or enlarging lumps on self exam, no vaginal bleeding. No neurological complaints.  OBJECTIVE:  The patient appears well, alert, oriented x 3, in no distress. BP 114/77   Pulse (!) 105   Temp 98.1 F (36.7 C) (Oral)   Wt 127 lb 6.4 oz (57.8 kg)   SpO2 99%   BMI 22.57 kg/m  ENT normal.  Neck supple. No adenopathy or thyromegaly. PERLA. Lungs are clear, good air entry, no wheezes, rhonchi or rales. S1 and S2 normal, no murmurs, regular rate and rhythm. Abdomen soft without tenderness, guarding, mass or organomegaly. Extremities show no edema, normal peripheral pulses. Erythematous macule in R upper thigh.  Neurological is normal, no focal findings.  BREAST EXAM: breasts appear normal, no suspicious masses, no skin or nipple changes or axillary nodes  PELVIC EXAM: examination not indicated  ASSESSMENT:  well woman  PLAN:  return annually or prn

## 2017-06-05 NOTE — Assessment & Plan Note (Signed)
A: slight decline P: Increase Wellbutrin to 300 mg daily

## 2017-08-25 LAB — POCT URINE PREGNANCY: Preg Test, Ur: NEGATIVE

## 2017-09-22 ENCOUNTER — Ambulatory Visit: Payer: 59 | Attending: Internal Medicine | Admitting: *Deleted

## 2017-09-22 DIAGNOSIS — Z3042 Encounter for surveillance of injectable contraceptive: Secondary | ICD-10-CM | POA: Diagnosis not present

## 2017-09-22 DIAGNOSIS — Z309 Encounter for contraceptive management, unspecified: Secondary | ICD-10-CM | POA: Diagnosis present

## 2017-09-22 MED ORDER — MEDROXYPROGESTERONE ACETATE 150 MG/ML IM SUSP
150.0000 mg | Freq: Once | INTRAMUSCULAR | Status: AC
  Administered 2017-09-22: 150 mg via INTRAMUSCULAR

## 2017-09-22 NOTE — Progress Notes (Signed)
Date last pap: 02/22/2016. Last Depo-Provera: 06/05/2017. Side Effects if any: none. Urine HCG indicated? Yes.  Depo-Provera 150 mg IM given in left upper outer quadrant by T.Justinn Welter,RN. Next appointment due 12/08/2017-122818

## 2017-12-08 ENCOUNTER — Ambulatory Visit: Payer: 59

## 2019-01-21 ENCOUNTER — Encounter: Payer: Self-pay | Admitting: Obstetrics and Gynecology

## 2019-01-21 ENCOUNTER — Ambulatory Visit (INDEPENDENT_AMBULATORY_CARE_PROVIDER_SITE_OTHER): Payer: Medicaid Other | Admitting: Obstetrics and Gynecology

## 2019-01-21 ENCOUNTER — Other Ambulatory Visit (HOSPITAL_COMMUNITY)
Admission: RE | Admit: 2019-01-21 | Discharge: 2019-01-21 | Disposition: A | Discharge status: Home / Self Care | Payer: Medicaid Other | Source: Ambulatory Visit | Attending: Obstetrics and Gynecology | Admitting: Obstetrics and Gynecology

## 2019-01-21 VITALS — BP 114/70 | Wt 122.0 lb

## 2019-01-21 DIAGNOSIS — Z3A08 8 weeks gestation of pregnancy: Secondary | ICD-10-CM

## 2019-01-21 DIAGNOSIS — I63413 Cerebral infarction due to embolism of bilateral middle cerebral arteries: Secondary | ICD-10-CM

## 2019-01-21 DIAGNOSIS — O0991 Supervision of high risk pregnancy, unspecified, first trimester: Secondary | ICD-10-CM

## 2019-01-21 DIAGNOSIS — O0990 Supervision of high risk pregnancy, unspecified, unspecified trimester: Secondary | ICD-10-CM | POA: Diagnosis not present

## 2019-01-21 DIAGNOSIS — Z124 Encounter for screening for malignant neoplasm of cervix: Secondary | ICD-10-CM | POA: Diagnosis present

## 2019-01-21 DIAGNOSIS — Z113 Encounter for screening for infections with a predominantly sexual mode of transmission: Secondary | ICD-10-CM | POA: Diagnosis present

## 2019-01-21 DIAGNOSIS — O9989 Other specified diseases and conditions complicating pregnancy, childbirth and the puerperium: Secondary | ICD-10-CM | POA: Diagnosis not present

## 2019-01-21 DIAGNOSIS — D6851 Activated protein C resistance: Secondary | ICD-10-CM

## 2019-01-21 NOTE — Progress Notes (Signed)
New Obstetric Patient H&P   Chief Complaint: "Desires prenatal care"   History of Present Illness: Patient is a 33 y.o. G2P1001 Not Hispanic or Latino female, unsure LMP 11/26/2018 presents with amenorrhea and positive home pregnancy test. Based on her  LMP, her EDD is Estimated Date of Delivery: 09/02/19 and her EGA is [redacted]w[redacted]d. Cycles are 5. days, regular, and occur approximately every : 28 days. Her last pap smear was 3 years ago and was no abnormalities.    She had a urine pregnancy test which was positive 3 or 4 week(s)  ago. Her last menstrual period was normal and lasted for  4 or 5 day(s). Since her LMP she claims she has experienced no issues. She had one episode of spotting occasionally a couple of weeks ago. The blood appeared darker red. Her past medical history is notable for a stroke in 2015 from which she has no residual deficits. Her MRI showed initially right MCA scattered embolic infarcts, then repeat MRI showed bilateral MCA embolic stroke. This appeared to be cardiogenic, according to the neurology note.   She was tested for multiple things and nothing was found (no DVT, no PFO, negative CTA and carotid u/s). 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 She was not started on abx. 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  She also was diagnosed with Raynaud phenomenon.  She has had inconclusive SLE workup. She was seen be rheumatology.  She was on Plaquenil for a while.  She was originally on warfarin, but was never therapeutic.  So, she was switched to Xarelto. She had later serology that was negative for SLE.  She no longer takes plaquenil or Xarelto. She was taking aspirin 325 mg at the onset of her pregnancy, which she has stopped.  No absolute etiology was ever found. See note from office visit on 07/06/2016 from Dr.  Marvel Plan of Neurology that nicely summarizes her history.   For contraception, she later was placed on Depo-Provera. She has been off this medication for over two years.       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  She claims she has gained no pounds since the start of her pregnancy.  There are cats in the home in the home  no  She admits close contact with children on a regular basis  yes  She has had chicken pox in the past yes She has had Tuberculosis exposures, symptoms, or previously tested positive for TB   no Current or past history of domestic violence. no  Genetic Screening/Teratology Counseling: (Includes patient, baby's father, or anyone in either family with:)   1. Patient's age >/= 62 at [facility]  no 2. Thalassemia ([place], [place], Mediterranean, or Asian background): MCV<80  no 3. Neural tube defect (meningomyelocele, spina bifida, anencephaly)  no 4. Congenital heart defect  no  5. Down syndrome  no 6. Tay-Sachs (Jewish, [place]  no 7. Canavan's Disease  no 8. Sickle cell disease or trait (African)  no  9. Hemophilia or other blood disorders  no  10. Muscular dystrophy  no  11. Cystic fibrosis  no  12. Huntington's Chorea  no  13. Mental retardation/autism  no 14. Other inherited genetic or chromosomal disorder  no 15. Maternal metabolic disorder (DM, PKU, etc)  no 16. Patient or FOB with a child with a birth defect not listed above no  16a. Patient or FOB with a birth defect themselves no 17. Recurrent pregnancy loss, or stillbirth  no  18. Any medications since LMP other than prenatal vitamins (include vitamins, supplements, OTC meds, drugs, alcohol)  ASA 325 mg, PNV 19. Any other genetic/environmental exposure to discuss  no  Infection History:   1. Lives with someone with TB or  TB exposed  no  2. Patient or partner has history of genital herpes  no 3. Rash or viral illness since LMP  no 4. History of STI (GC, CT, HPV, syphilis, HIV)  no 5. History of recent travel :  no  Other pertinent information:  See info on stroke     Review of Systems:10 point review of systems negative unless otherwise noted in HPI  Past Medical History:  Diagnosis Date  . Endocarditis of mitral valve    libman sachs?  . Heterozygous factor V Leiden mutation (HCC) 08/12/2014   07/17/14  . Intrauterine growth restriction of newborn   . Stroke [facility]) 06/2014    Past Surgical History:  Procedure Laterality Date  . right ear surgery    . TEE WITHOUT CARDIOVERSION N/A 07/18/2014   Procedure: TRANSESOPHAGEAL ECHOCARDIOGRAM (TEE);  Surgeon: Chrystie Nose, MD;  Location: [facility] ENDOSCOPY;  Service: Cardiovascular;  Laterality: N/A;  . TEE WITHOUT CARDIOVERSION N/A 01/13/2015   Procedure: TRANSESOPHAGEAL ECHOCARDIOGRAM (TEE);  Surgeon: Donato Schultz, MD;  Location: [facility] ENDOSCOPY;  Service: Cardiovascular;  Laterality: N/A;    Gynecologic History: Patient's last menstrual period was 11/26/2018.  Obstetric History: G2P1001  Family History  Problem Relation Age of Onset  . CAD Mother   . Heart disease Mother   . Stroke Paternal Grandfather   . Cancer Paternal Grandfather        lung cancer   . Stroke Maternal Uncle   . Stroke Paternal Uncle   . Stroke Other        uncle 22s, grandfather 34s    Social History   Socioeconomic History  . Marital status: Single    Spouse name: Not on file  . Number of children: 1  . Years of education: 28  . Highest education level: Not on file  Occupational History  . 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 Financial resource strain: Not on file  . Food insecurity:    Worry: Not on file    Inability: Not on file  . Transportation needs:    Medical: Not on file    Non-medical: Not on file  Tobacco Use  . Smoking status:  Current Some Day Smoker    Packs/day: 0.25    Years: 10.00    Pack years: 2.50    Types: Cigarettes  . Smokeless tobacco: Never Used  . Tobacco comment: pack will last her two to three days  Substance and Sexual Activity  . Alcohol use: Yes    Alcohol/week: 1.0 standard drinks    Types: 1 Shots of liquor per week    Comment: Occasionally.  . Drug use: No  . Sexual activity: Yes    Birth control/protection: None  Lifestyle  . Physical activity:    Days per week: Not on file    Minutes per session: Not  on file  . Stress: Not on file  Relationships  . Social connections:    Talks on phone: Not on file    Gets together: Not on file    Attends religious service: Not on file    Active member of club or organization: Not on file    Attends meetings of clubs or organizations: Not on file    Relationship status: Not on file  . Intimate partner violence:    Fear of current or ex partner: Not on file    Emotionally abused: Not on file    Physically abused: Not on file    Forced sexual activity: Not on file  Other Topics Concern  . Not on file  Social History Narrative   Patient is single with one child.   Patient is right handed.   Patient has 12 th grade education.   Patient drinks 2 caffeine drinks a day    No Known Allergies  Prior to Admission medications   Medication Sig Start Date End Date Taking? Authorizing Provider  Prenatal Multivit-Min-Fe-FA (PRENATAL VITAMINS PO) Take by mouth.   Yes [provider]    Physical Exam BP 114/70   Wt 122 lb (55.3 kg)   LMP 11/26/2018   BMI 21.61 kg/m   Physical Exam Exam conducted with a chaperone present.  Constitutional:      General: She is not in acute distress.    Appearance: Normal appearance. She is well-developed.  HENT:     Head: Normocephalic and atraumatic.  Eyes:     General: No scleral icterus.    Conjunctiva/sclera: Conjunctivae normal.  Neck:     Musculoskeletal: Normal range of motion and neck  supple.     Thyroid: No thyromegaly.  Cardiovascular:     Rate and Rhythm: Normal rate and regular rhythm.     Heart sounds: Normal heart sounds. No murmur. No friction rub. No gallop.   Pulmonary:     Effort: Pulmonary effort is normal.     Breath sounds: Normal breath sounds. No wheezing.  Abdominal:     General: There is no distension.     Palpations: Abdomen is soft. There is no mass.     Tenderness: There is no abdominal tenderness. There is no guarding or rebound.     Hernia: No hernia is present. There is no hernia in the right inguinal area or left inguinal area.  Genitourinary:    Exam position: Supine.     Labia:        Right: No rash, tenderness or lesion.        Left: No rash, tenderness or lesion.      Vagina: Normal.     Cervix: Normal.     Uterus: Normal.      Adnexa: Right adnexa normal and left adnexa normal.  Musculoskeletal: Normal range of motion.  Skin:    General: Skin is warm and dry.     Findings: No rash.  Neurological:     General: No focal deficit present.     Mental Status: She is alert and oriented to person, place, and time.  Psychiatric:        Mood and Affect: Mood normal.        Behavior: Behavior normal.        Judgment: Judgment normal.     Female Chaperone present during breast and/or pelvic exam.   Assessment: 33 y.o. G2P1001 at 1964w0d presenting to initiate prenatal care  Plan: 1) Avoid alcoholic beverages.  2) Patient encouraged not to smoke.  3) Discontinue the use of all non-medicinal drugs and chemicals.  4) Take prenatal vitamins daily.  5) Nutrition, food safety (fish, cheese advisories, and high nitrite foods) and exercise discussed. 6) Hospital and practice style discussed with cross coverage system.  7) Genetic Screening, such as with 1st Trimester Screening, cell free fetal DNA, AFP testing, and Ultrasound, as well as with amniocentesis and CVS as appropriate, is discussed with patient. At the conclusion of today's visit  patient undecided genetic testing 8) Patient is asked about travel to areas at risk for the [place] virus, and counseled to avoid travel and exposure to mosquitoes or sexual partners who may have themselves been exposed to the virus. Testing is discussed, and will be ordered as appropriate.  9) history of stroke:  Complicated history. However, she is at increased risk for VTE during pregnancy. Based on ACOG guidance (PB 132, 06/2017), she should be on lovenox 40 mg Pierce once daily at a minimum.  Will start her on this right after her viability and dating ultrasound in 2 days.  Once viability is established, I recommend a referral to [facility] to make any further recommendations based on her obstetric past and her history of embolic stroke.  Thomasene MohairStephen Clemente Dewey, MD 01/21/2019 11:48 AM

## 2019-01-21 NOTE — Patient Instructions (Signed)
For nausea (these may be purchased over-the-counter): -Vitamin B6 (pyridoxine):  25 mg three times each day (may buy 100 mg tablet and take twice per day or try to cut into 4 equal pieces and take 1 piece three times each day).  - doxylamine (found in [employer] and other sleep agents that can be bought in the store): take 25 - 50 mg at bedtime.  May take up to 25 mg three time each day.  However, keep in mind that this might make you sleepy.  

## 2019-01-22 LAB — URINE DRUG PANEL 7
Amphetamines, Urine: NEGATIVE ng/mL
BARBITURATE QUANT UR: NEGATIVE ng/mL
Benzodiazepine Quant, Ur: NEGATIVE ng/mL
Cannabinoid Quant, Ur: NEGATIVE ng/mL
Cocaine (Metab.): NEGATIVE ng/mL
OPIATE QUANT UR: NEGATIVE ng/mL
PCP QUANT UR: NEGATIVE ng/mL

## 2019-01-22 LAB — PROTEIN / CREATININE RATIO, URINE
Creatinine, Urine: 127.9 mg/dL
PROTEIN UR: 11.8 mg/dL
PROTEIN/CREAT RATIO: 92 mg/g{creat} (ref 0–200)

## 2019-01-23 ENCOUNTER — Other Ambulatory Visit: Payer: Self-pay | Admitting: Obstetrics and Gynecology

## 2019-01-23 ENCOUNTER — Ambulatory Visit (INDEPENDENT_AMBULATORY_CARE_PROVIDER_SITE_OTHER): Payer: Medicaid Other | Admitting: Obstetrics and Gynecology

## 2019-01-23 ENCOUNTER — Encounter: Payer: Self-pay | Admitting: Obstetrics and Gynecology

## 2019-01-23 ENCOUNTER — Ambulatory Visit (INDEPENDENT_AMBULATORY_CARE_PROVIDER_SITE_OTHER): Payer: Medicaid Other

## 2019-01-23 VITALS — BP 90/50 | Wt 124.0 lb

## 2019-01-23 DIAGNOSIS — O3481 Maternal care for other abnormalities of pelvic organs, first trimester: Secondary | ICD-10-CM

## 2019-01-23 DIAGNOSIS — N8311 Corpus luteum cyst of right ovary: Secondary | ICD-10-CM

## 2019-01-23 DIAGNOSIS — Z3A08 8 weeks gestation of pregnancy: Secondary | ICD-10-CM | POA: Diagnosis not present

## 2019-01-23 DIAGNOSIS — M329 Systemic lupus erythematosus, unspecified: Secondary | ICD-10-CM

## 2019-01-23 DIAGNOSIS — R768 Other specified abnormal immunological findings in serum: Secondary | ICD-10-CM

## 2019-01-23 DIAGNOSIS — I63311 Cerebral infarction due to thrombosis of right middle cerebral artery: Secondary | ICD-10-CM

## 2019-01-23 DIAGNOSIS — O0990 Supervision of high risk pregnancy, unspecified, unspecified trimester: Secondary | ICD-10-CM

## 2019-01-23 DIAGNOSIS — D6851 Activated protein C resistance: Secondary | ICD-10-CM

## 2019-01-23 LAB — CYTOLOGY - PAP
Chlamydia: NEGATIVE
DIAGNOSIS: NEGATIVE
HPV: NOT DETECTED
Neisseria Gonorrhea: NEGATIVE

## 2019-01-23 LAB — URINE CULTURE

## 2019-01-23 MED ORDER — ENOXAPARIN SODIUM 40 MG/0.4ML ~~LOC~~ SOLN: 40.0000 mg | SUBCUTANEOUS | 11 refills | Status: DC

## 2019-01-23 NOTE — Progress Notes (Signed)
ROB and U/S today- no concerns 

## 2019-01-23 NOTE — Progress Notes (Signed)
    Routine Prenatal Care Visit  Subjective  Cristina [name] is a 33 y.o. G2P1001 at 7636w2d being seen today for ongoing prenatal care.  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  ----------------------------------------------------------------------------------- Patient reports no complaints.   Contractions: Not present. Vag. Bleeding: None.  Movement: Absent. Denies leaking of fluid.  ----------------------------------------------------------------------------------- The following portions of the patient's history were reviewed and updated as appropriate: allergies, current medications, past family history, past medical history, past social history, past surgical history and problem list. Problem list updated.   Objective  Blood pressure (!) 90/50, weight 124 lb (56.2 kg), last menstrual period 11/26/2018. Pregravid weight 122 lb (55.3 kg) Total Weight Gain 2 lb (0.907 kg) Urinalysis:      Fetal Status: Fetal Heart Rate (bpm): 180   Movement: Absent     General:  Alert, oriented and cooperative. Patient is in no acute distress.  Skin: Skin is warm and dry. No rash noted.   Cardiovascular: Normal heart rate noted  Respiratory: Normal respiratory effort, no problems with respiration noted  Abdomen: Soft, gravid, appropriate for gestational age. Pain/Pressure: Absent     Pelvic:  Cervical exam deferred        Extremities: Normal range of motion.     Mental  Status: Normal mood and affect. Normal behavior. Normal judgment and thought content.     Assessment   33 y.o. G2P1001 at 7436w2d by  09/02/2019, by Last Menstrual Period presenting for routine prenatal visit  Plan   pregnancy Problems (from 01/21/19 to present)    Problem Noted Resolved   Supervision of high risk pregnancy, antepartum 01/21/2019 by Conard NovakJackson, Stephen D, MD No       Gestational age appropriate obstetric precautions including but not limited to vaginal bleeding, contractions, leaking of fluid and fetal movement were reviewed in detail with the patient.    NOB labs today Reviewed options for screening for down syndrome, cystic fibrosis, fragile X, and SMA.  Discussed initiation of Lovenox with patient Will refer to MFM for further guidance regarding anticoagulation in pregnancy. May benefit from full anticoagulation given history of stroke.   Return in about 2 weeks (around 02/06/2019) for ROB with Dr. Jean RosenthalJackson.  Natale Milchhristanna R Amandine Covino MD [facility], [facility] 01/23/2019, 3:47 PM

## 2019-01-24 ENCOUNTER — Ambulatory Visit: Payer: Medicaid Other

## 2019-01-25 LAB — RPR+RH+ABO+RUB AB+AB SCR+CB...
Antibody Screen: NEGATIVE
Antibody Screen: NEGATIVE
HEMATOCRIT: 33.5 % — AB (ref 34.0–46.6)
HEMOGLOBIN: 12.9 g/dL (ref 11.1–15.9)
HEP B S AG: NEGATIVE
HIV SCREEN 4TH GENERATION: NONREACTIVE
HIV Screen 4th Generation wRfx: NONREACTIVE
Hematocrit: 37.9 % (ref 34.0–46.6)
Hemoglobin: 11.5 g/dL (ref 11.1–15.9)
Hepatitis B Surface Ag: NEGATIVE
MCH: 33 pg (ref 26.6–33.0)
MCH: 33.4 pg — ABNORMAL HIGH (ref 26.6–33.0)
MCHC: 34 g/dL (ref 31.5–35.7)
MCHC: 34.3 g/dL (ref 31.5–35.7)
MCV: 97 fL (ref 79–97)
MCV: 97 fL (ref 79–97)
PLATELETS: 230 10*3/uL (ref 150–450)
Platelets: 214 10*3/uL (ref 150–450)
RBC: 3.91 x10E6/uL (ref 3.77–5.28)
RBC: 3.44 x10E6/uL — ABNORMAL LOW (ref 3.77–5.28)
RDW: 11.6 % — ABNORMAL LOW (ref 11.7–15.4)
RDW: 11.8 % (ref 11.7–15.4)
RH TYPE: POSITIVE
RPR Ser Ql: NONREACTIVE
RPR Ser Ql: NONREACTIVE
Rh Factor: POSITIVE
Rubella Antibodies, IGG: 0.95 index — ABNORMAL LOW (ref >0.99)
Rubella Antibodies, IGG: 0.95 index — ABNORMAL LOW (ref >0.99)
VARICELLA: 471 {index} (ref >165)
VARICELLA: 421 {index} (ref >165)
WBC: 6.3 10*3/uL (ref 3.4–10.8)
WBC: 7.2 10*3/uL (ref 3.4–10.8)

## 2019-02-07 ENCOUNTER — Ambulatory Visit
Admission: RE | Admit: 2019-02-07 | Discharge: 2019-02-07 | Disposition: A | Discharge status: Home / Self Care | Payer: Medicaid Other | Source: Ambulatory Visit | Attending: Maternal & Fetal Medicine | Admitting: Maternal & Fetal Medicine

## 2019-02-07 VITALS — BP 103/73 | HR 71 | Temp 99.5°F | Resp 18 | Wt 124.0 lb

## 2019-02-07 DIAGNOSIS — M3211 Endocarditis in systemic lupus erythematosus: Secondary | ICD-10-CM

## 2019-02-07 DIAGNOSIS — I69398 Other sequelae of cerebral infarction: Secondary | ICD-10-CM

## 2019-02-07 DIAGNOSIS — Z3A10 10 weeks gestation of pregnancy: Secondary | ICD-10-CM

## 2019-02-07 DIAGNOSIS — I63311 Cerebral infarction due to thrombosis of right middle cerebral artery: Secondary | ICD-10-CM

## 2019-02-07 DIAGNOSIS — M329 Systemic lupus erythematosus, unspecified: Secondary | ICD-10-CM | POA: Diagnosis not present

## 2019-02-07 DIAGNOSIS — I63419 Cerebral infarction due to embolism of unspecified middle cerebral artery: Secondary | ICD-10-CM

## 2019-02-07 DIAGNOSIS — D6851 Activated protein C resistance: Secondary | ICD-10-CM

## 2019-02-07 DIAGNOSIS — G43709 Chronic migraine without aura, not intractable, without status migrainosus: Secondary | ICD-10-CM

## 2019-02-07 DIAGNOSIS — O3400 Maternal care for unspecified congenital malformation of uterus, unspecified trimester: Secondary | ICD-10-CM

## 2019-02-07 DIAGNOSIS — F0631 Mood disorder due to known physiological condition with depressive features: Secondary | ICD-10-CM

## 2019-02-07 HISTORY — DX: Anxiety disorder, unspecified: F41.9

## 2019-02-07 HISTORY — DX: Maternal care for unspecified congenital malformation of uterus, unspecified trimester: O34.00

## 2019-02-07 HISTORY — DX: Depression, unspecified: F32.A

## 2019-02-07 HISTORY — DX: Bicornate uterus: Q51.3

## 2019-02-07 HISTORY — DX: Major depressive disorder, single episode, unspecified: F32.9

## 2019-02-07 HISTORY — DX: Systemic involvement of connective tissue, unspecified: M35.9

## 2019-02-07 NOTE — Progress Notes (Addendum)
[facility] Consultation   Chief Complaint: Here for recommendations regarding history of stroke now pregnant.  HPI: Ms. Cristina [name] is a 33 y.o. G2P1001 at 4534w3d by LMP confirmed by 2430w5d US who presents in consultation from [facility]  for a history of stroke.  The patient was in good health until July 2015, when she suffered symptoms of stroke.  She was out to dinner with the man who is now her fiance and other friends.  She felt "funny."  Her left side felt numb from her hip to her face and her speech was slurred.  Her friends noticed that the left side of her face was drooping.  [facility] was [facility] where she was diagnosed with R MCA stroke.  Her risk factors included smoking (a PPD), oral contraceptives, and history of migraine.  TEE showed small mobile mitral valve vegetation.  (No vegetations seen on mitral valve leaflets on follow-up TEE 1/01/03/2015.).  The patient never lost consciousness.  She did not have thrombolysis.  She was anticoagulated for a year, first with warfarin, which was never therapeutic, and then with Xarelto.  Because of insurance issues?  She was switched to full-dose ASA for stroke prophylaxis.    A work-up included negative LE Dopplers, negative neck imaging and TTE with no patent foramen ovale.  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  APS labs, anti-Ro and anti-La were negative.   ANA was positive.  ANA dsDNA was 5 IU/mL (indeterminate).  Follow-up value was 6 on 01/07/2015.  Cold agglutinins were negative.  Sed rate was normal. TSH was normal.  The patient was referred to Rheumatology.  She was treated with amlodipine for her Raynaud's and Plaquenil.  During the year after her stroke, the patient notice an increase in the lability of her mood.  She was treated for depression/anxiety and stopped her meds 08/2018, as she thought they were no longer necessary.  At her NOB visit, she was started  on Lovenox  (enoxparin) 40 mg [place] qd.  Her full-dose ASA was discontinued.    Obstetric History:  OB History  Gravida Para Term Preterm AB Living  2 1 1  0 0 1  SAB TAB Ectopic Multiple Live Births  0 0 0 0 1    # Outcome Date GA Lbr Len/2nd Weight Sex Delivery Anes PTL Lv  2 Current           1 Term 08/01/12 4613w0d  1843 g M Vag-Spont  N LIV     Complications: IUGR (intrauterine growth restriction) affecting care of mother    Gynecologic History:  Patient's last menstrual period was 11/26/2018.  History of bicornuate uterus Last pap smear was normal 01/21/2019   Past Medical History: Patient  has a past medical history of Anxiety, Bicornate uterus complicating pregnancy (02/07/2019), Connective tissue disease (HCC), Depression, Endocarditis of mitral valve, Heterozygous factor V Leiden mutation (HCC) (08/12/2014), Intrauterine growth restriction of newborn, and Stroke (HCC) (06/2014). No vegetations seen on mitral valve leaflets on follow-up TEE 1/01/03/2015.  Past Surgical History: She  has a past surgical history that includes right ear surgery; TEE without cardioversion (N/A, 07/18/2014); and TEE without cardioversion (N/A, 01/13/2015).   Medications:  Current Outpatient Medications on File Prior to Encounter  Medication Sig Dispense Refill  . enoxaparin (LOVENOX) 40 MG/0.4ML injection Inject 0.4 mLs (40 mg total) into the skin daily. 30 Syringe 11  . Prenatal Multivit-Min-Fe-FA (PRENATAL VITAMINS PO) Take by mouth.  She also takes Claritin prn   Allergies: Patient has No Known Allergies.   Social History: Patient  reports that she quit smoking about 4 weeks ago. Her smoking use included cigarettes. She has a 2.50 pack-year smoking history. She has never used smokeless tobacco. She reports previous alcohol use of about 1.0 standard drinks of alcohol per week. She reports that she does not use drugs.   Family History: family history includes Alcohol abuse in her father; CAD in her  mother; COPD in her mother; Cancer in her paternal grandfather; Stroke in her maternal uncle, paternal grandfather, paternal uncle, and another family member.  Review of Systems A full 12 point review of systems was significant for headaches, occasional chest heaviness, and Occasional Raynaud's.  Otherwise negative.  Her mood is good.  Physical Exam: BP 103/73 (BP Location: Right Arm)   Pulse 71   Temp 99.5 F (37.5 C) (Oral)   Resp 18   Wt 124 lb (56.2 kg)   LMP 11/26/2018   SpO2 99%   BMI 21.97 kg/m   Abdomen - trivial if any bruising from injection sites.  FHR 150s.  Asessement: 1. History of R MCA stroke - work up revealed FVL heterozygosity - had been on full dose ASA - now on enoxaparin 40 mg qd 2. Connective tissue disease - positive ANA, indeterminate dsDNA, negative APS labs, negative anti-Ro and anti-La in 2015 3. Headaches/history of migraines 4. History of fetal growth restriction - SGA infant 5. History of depression/anxiety - no meds since 9/20  6. Routine OB care   Recommendations: 1. History of R MCA stroke - work up revealed FVL heterozygosity - had been on full dose ASA - now on enoxaparin 40 mg qd  The patient was counseled that pregnancy increases the risk of recurrent stroke 3-4 fold.  The absolute risk of recurrent stroke in pregnancy is ~ 1%.    The only antiplatelet agent that is considered safe in pregnancy is low-dose aspirin (81 mg per day).  She was advised to start this in addition to her enoxaparin.  An important contributing factor in the increased risk of stroke in pregnancy is hypercoagulability.  The most comprehensive guidelines addressing prevention of recurrent stroke in pregnancy are the Canadian Heart Association guidelines. They recommend a combination of prophylactic low-molecular-weight heparin (eg enoxaparin 40 mg qd) and low-dose aspirin during pregnancy and for 6 weeks postpartum. Hermelinda Medicus et al, Intl J of Stroke, 2018)  Enoxaparin  should be held 12-24 hours prior to scheduled delivery.  Enoxaparin should be restarted 12 hours after a vaginal delivery or 24 hours after a cesarean delivery and continued for 6 weeks.  At the time of active labor and until her enoxaparin is restarted, I would recommend pneumatic compression devices.  Postpartum, she should avoid estrogen containing contraceptives (and Depo-Provera) in the future.  Other progestin-only contraceptives have not been shown to increase the risk of arterial or venous thromboembolism (Tepper et al, Contraception, 2016)  Fetal surveillance recommendations below. 2. Connective tissue disease - positive ANA, indeterminate dsDNA, negative APS labs, negative anti-Ro and anti-La in 2015  I have recommended a one-time consult with Dr. Dawayne Cirri at [facility].   3. Headaches/history of migraines  The patient may take acetaminophen 1000 every 4-6 hours up to 3 times per day.   4. History of fetal growth restriction - SGA infant with additional risk factor of FVL  l would recommend fetal surveillance to include monthly ultrasounds starting at [redacted] weeks gestation, weekly  antepartum testing starting at [redacted] weeks gestation and delivery at [redacted] weeks gestation, or sooner if FGR develops. 5. History of depression/anxiety - no meds since 9/20   Monitor for symptoms, especially PP.  6. Routine OB care  The patient should be able to continue her [facility] at [facility]  She had the opportunity to meet with our genetic counselor today about first trimester screening.  She will make a decision at her next OB appt.  She was scheduled to return to [facility] in 8 weeks for anatomy US and follow-up consult.    Total time spent with the patient was 60 minutes with greater than 50% spent in counseling and coordination of care.  We appreciate this interesting consult and will be happy to be involved in the ongoing care of Cristina [name] in anyway her obstetricians desire.  Argentina Ponder, MD [facility]

## 2019-02-08 ENCOUNTER — Ambulatory Visit (INDEPENDENT_AMBULATORY_CARE_PROVIDER_SITE_OTHER): Payer: Medicaid Other | Admitting: Obstetrics and Gynecology

## 2019-02-08 ENCOUNTER — Encounter: Payer: Self-pay | Admitting: Obstetrics and Gynecology

## 2019-02-08 VITALS — BP 102/64 | Wt 126.0 lb

## 2019-02-08 DIAGNOSIS — O0990 Supervision of high risk pregnancy, unspecified, unspecified trimester: Secondary | ICD-10-CM

## 2019-02-08 DIAGNOSIS — F0631 Mood disorder due to known physiological condition with depressive features: Secondary | ICD-10-CM

## 2019-02-08 DIAGNOSIS — Z3A10 10 weeks gestation of pregnancy: Secondary | ICD-10-CM

## 2019-02-08 DIAGNOSIS — I63419 Cerebral infarction due to embolism of unspecified middle cerebral artery: Secondary | ICD-10-CM

## 2019-02-08 DIAGNOSIS — O09291 Supervision of pregnancy with other poor reproductive or obstetric history, first trimester: Secondary | ICD-10-CM

## 2019-02-08 DIAGNOSIS — I69398 Other sequelae of cerebral infarction: Secondary | ICD-10-CM

## 2019-02-08 DIAGNOSIS — R768 Other specified abnormal immunological findings in serum: Secondary | ICD-10-CM

## 2019-02-08 DIAGNOSIS — F419 Anxiety disorder, unspecified: Secondary | ICD-10-CM

## 2019-02-08 DIAGNOSIS — Z1379 Encounter for other screening for genetic and chromosomal anomalies: Secondary | ICD-10-CM

## 2019-02-08 DIAGNOSIS — Z8759 Personal history of other complications of pregnancy, childbirth and the puerperium: Secondary | ICD-10-CM | POA: Insufficient documentation

## 2019-02-08 DIAGNOSIS — D6851 Activated protein C resistance: Secondary | ICD-10-CM

## 2019-02-08 DIAGNOSIS — O9989 Other specified diseases and conditions complicating pregnancy, childbirth and the puerperium: Secondary | ICD-10-CM

## 2019-02-08 NOTE — Progress Notes (Signed)
  Routine Prenatal Care Visit  Subjective  Cristina [name] is a 33 y.o. G2P1001 at [redacted]w[redacted]d being seen today for ongoing prenatal care.  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  ----------------------------------------------------------------------------------- Patient reports no complaints.   Contractions: Not present. Vag. Bleeding: None.  Movement: Absent. Denies leaking of fluid.  ----------------------------------------------------------------------------------- The following portions of the patient's history were reviewed and updated as appropriate: allergies, current medications, past family history, past medical history, past social history, past surgical history and problem list. Problem list updated.   Objective  Blood pressure 102/64, weight 126 lb (57.2 kg), last menstrual period 11/26/2018. Pregravid weight 122 lb (55.3 kg) Total Weight Gain 4 lb (1.814 kg) Urinalysis: Urine Protein    Urine Glucose    Fetal Status: Fetal Heart Rate (bpm): 175   Movement: Absent     General:  Alert, oriented and cooperative. Patient is in no acute distress.  Skin: Skin is warm and dry. No rash noted.   Cardiovascular: Normal heart rate noted  Respiratory: Normal respiratory effort, no problems with respiration noted  Abdomen: Soft, gravid, appropriate for gestational age. Pain/Pressure: Absent     Pelvic:  Cervical exam  deferred        Extremities: Normal range of motion.     Mental Status: Normal mood and affect. Normal behavior. Normal judgment and thought content.   Assessment   33 y.o. 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 Please refer to After Visit Summary for other counseling recommendations.   - see [facility] note for recommendations. - NIPT today - she is taking her lovenox w/o issues. Has not started bASA yet.   Return in about 4 weeks (around 03/08/2019) for Routine Prenatal Appointment.  Thomasene Mohair, MD, [facility], [facility] 02/08/2019 9:37 AM

## 2019-02-14 LAB — MATERNIT 21 PLUS CORE, BLOOD
CHROMOSOME 18: NEGATIVE
CHROMOSOME 21: NEGATIVE
Chromosome 13: NEGATIVE
Fetal Fraction: 9
Y Chromosome: DETECTED

## 2019-03-08 ENCOUNTER — Other Ambulatory Visit: Payer: Self-pay

## 2019-03-08 ENCOUNTER — Ambulatory Visit (INDEPENDENT_AMBULATORY_CARE_PROVIDER_SITE_OTHER): Payer: Medicaid Other | Admitting: Obstetrics and Gynecology

## 2019-03-08 ENCOUNTER — Encounter: Payer: Self-pay | Admitting: Obstetrics and Gynecology

## 2019-03-08 VITALS — BP 102/58 | Wt 127.0 lb

## 2019-03-08 DIAGNOSIS — D6851 Activated protein C resistance: Secondary | ICD-10-CM

## 2019-03-08 DIAGNOSIS — Z3A14 14 weeks gestation of pregnancy: Secondary | ICD-10-CM

## 2019-03-08 DIAGNOSIS — F419 Anxiety disorder, unspecified: Secondary | ICD-10-CM

## 2019-03-08 DIAGNOSIS — Z8759 Personal history of other complications of pregnancy, childbirth and the puerperium: Secondary | ICD-10-CM

## 2019-03-08 DIAGNOSIS — O0990 Supervision of high risk pregnancy, unspecified, unspecified trimester: Secondary | ICD-10-CM

## 2019-03-08 DIAGNOSIS — M329 Systemic lupus erythematosus, unspecified: Secondary | ICD-10-CM

## 2019-03-08 DIAGNOSIS — I63419 Cerebral infarction due to embolism of unspecified middle cerebral artery: Secondary | ICD-10-CM

## 2019-03-08 DIAGNOSIS — O09292 Supervision of pregnancy with other poor reproductive or obstetric history, second trimester: Secondary | ICD-10-CM

## 2019-03-08 DIAGNOSIS — O26892 Other specified pregnancy related conditions, second trimester: Secondary | ICD-10-CM

## 2019-03-08 NOTE — Progress Notes (Signed)
Routine Prenatal Care Visit  Subjective  Cristina [name] is a 33 y.o. G2P1001 at [redacted]w[redacted]d being seen today for ongoing prenatal care.  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  ----------------------------------------------------------------------------------- Patient reports no complaints.   Contractions: Not present. Vag. Bleeding: None.  Movement: Absent. Denies leaking of fluid.  Taking lovenox 40mg  [place] daily, as prescribed, taking bASA as well.  Has appt with rheum on 3/16 at [facility], encouraged to keep this appt. ----------------------------------------------------------------------------------- The following portions of the patient's history were reviewed and updated as appropriate: allergies, current medications, past family history, past medical history, past social history, past surgical history and problem list. Problem list updated.   Objective  Blood pressure (!) 102/58, weight 127 lb (57.6 kg), last menstrual period 11/26/2018. Pregravid weight 122 lb (55.3 kg) Total Weight Gain 5 lb (2.268 kg) Urinalysis: Urine Protein    Urine Glucose    Fetal Status: Fetal Heart Rate (bpm): 165   Movement: Absent     General:  Alert, oriented and cooperative. Patient is in no acute distress.  Skin: Skin is warm and dry. No rash noted.   Cardiovascular: Normal heart rate noted  Respiratory: Normal respiratory effort, no  problems with respiration noted  Abdomen: Soft, gravid, appropriate for gestational age. Pain/Pressure: Absent     Pelvic:  Cervical exam deferred        Extremities: Normal range of motion.     Mental Status: Normal mood and affect. Normal behavior. Normal judgment and thought content.   Assessment   33 y.o. G2P1001 at [redacted]w[redacted]d by  09/02/2019, by Last Menstrual Period presenting for routine prenatal visit  Plan   pregnancy Problems (from 01/21/19 to present)    Problem Noted Resolved   History of prior pregnancy with SGA newborn 02/08/2019 by Conard Novak, MD No   Supervision of high risk pregnancy, antepartum 01/21/2019 by Conard Novak, MD No   Overview Signed 03/08/2019 10:00 AM by Conard Novak, MD    [facility] Prenatal Labs  Dating L/8 Blood type: O/Positive/-- (01/29 1556)   Genetic Screen AFP: [ ]  offer    NIPS: diploid XY Antibody:Negative (01/29 1556)  Anatomic [place]  Rubella: 0.95 (01/29 1556)  Varicella: Immune  GTT Third trimester:  RPR: Non Reactive (01/29 1556)   Rhogam n/a HBsAg: Negative (01/29 1556)   TDaP vaccine                       Flu Shot: HIV: Non Reactive (01/29 1556)   Baby Food                                GBS:   Contraception  Pap: NILM, HPV negative  CBB     CS/VBAC    Support Person            Positive ANA (antinuclear antibody) 07/06/2016 by Marvel Plan, MD No   Anxiety disorder 02/22/2016 by Dessa Phi, MD No  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 Lucas Mallow (rheum) at [facility] (3/16)      Cerebral infarction due to embolism of middle cerebral artery (HCC) 01/06/2015 by Marvel Plan, MD No   Overview Signed 03/08/2019 10:02 AM by Conard Novak, MD     The patient was counseled that pregnancy increases the risk of recurrent stroke  3-4 fold. The absolute risk of recurrent stroke in pregnancy is ~ 1%.   The only antiplatelet agent that is considered safe in pregnancy is low-dose aspirin (81 mg per day).  She was advised to start this in addition to her enoxaparin.  An important contributing factor in the increased risk of stroke in pregnancy is hypercoagulability.  The most comprehensive guidelines addressing prevention of recurrent stroke in pregnancy are the Canadian Heart Association guidelines. They recommend a combination of prophylactic low-molecular-weight heparin (eg enoxaparin 40 mg qd) and low-dose aspirin during pregnancy and for 6 weeks postpartum. Hermelinda Medicus et al, Intl J of Stroke, 2018)  Enoxaparin should be held 12-24 hours prior to scheduled delivery.  Enoxaparin should be restarted 12 hours after a vaginal delivery or 24 hours after a cesarean delivery and continued for 6 weeks.  At the time of active labor and until her enoxaparinis restarted, I would recommend pneumatic compression devices.  Postpartum, she should avoid estrogen containing contraceptives (and Depo-Provera) in the future.Other progestin-only contraceptives have not been shown to increase the risk of arterial or venous thromboembolism (Tepper et al, Contraception, 2016)      Heterozygous factor V Leiden mutation (HCC) 08/12/2014 by Levert Feinstein, MD No   Overview Signed 08/12/2014  3:48 PM by Levert Feinstein, MD    07/17/14          Preterm labor symptoms and general obstetric precautions including but not limited to vaginal bleeding, contractions, leaking of fluid and fetal movement were reviewed in detail with the patient. Please refer to After Visit Summary for other counseling recommendations.   - Encouraged to keep all f/u appts with [facility], and to continue taking medication, as prescribed (lovenox, bASA)  Return in about 4 weeks (around 04/05/2019) for Routine Prenatal Appointment.  Thomasene Mohair, MD,  [facility], [facility] 03/08/2019 10:12 AM

## 2019-03-25 ENCOUNTER — Other Ambulatory Visit: Payer: Self-pay

## 2019-03-25 DIAGNOSIS — O0990 Supervision of high risk pregnancy, unspecified, unspecified trimester: Secondary | ICD-10-CM

## 2019-04-03 ENCOUNTER — Ambulatory Visit (INDEPENDENT_AMBULATORY_CARE_PROVIDER_SITE_OTHER): Payer: Medicaid Other | Admitting: Obstetrics and Gynecology

## 2019-04-03 ENCOUNTER — Other Ambulatory Visit: Payer: Self-pay

## 2019-04-03 ENCOUNTER — Encounter: Payer: Self-pay | Admitting: Obstetrics and Gynecology

## 2019-04-03 DIAGNOSIS — Z8759 Personal history of other complications of pregnancy, childbirth and the puerperium: Secondary | ICD-10-CM

## 2019-04-03 DIAGNOSIS — O0990 Supervision of high risk pregnancy, unspecified, unspecified trimester: Secondary | ICD-10-CM

## 2019-04-03 DIAGNOSIS — D6851 Activated protein C resistance: Secondary | ICD-10-CM

## 2019-04-03 DIAGNOSIS — Z3A18 18 weeks gestation of pregnancy: Secondary | ICD-10-CM

## 2019-04-03 DIAGNOSIS — M329 Systemic lupus erythematosus, unspecified: Secondary | ICD-10-CM

## 2019-04-03 DIAGNOSIS — O09292 Supervision of pregnancy with other poor reproductive or obstetric history, second trimester: Secondary | ICD-10-CM

## 2019-04-03 NOTE — Progress Notes (Signed)
Routine Prenatal Care Visit- Virtual Visit  Subjective   Virtual Visit via Telephone Note  I connected with Barrie Lyme on 04/03/19 at  3:10 PM EDT by telephone and verified that I am speaking with the correct person using two identifiers.   I discussed the limitations, risks, security and privacy concerns of performing an evaluation and management service by telephone and the availability of in person appointments. I also discussed with the patient that there may be a patient responsible charge related to this service. The patient expressed understanding and agreed to proceed.  The patient was at work.  I spoke with the patient from my  office  Cristina [name] is a 33 y.o. G2P1001 at [redacted]w[redacted]d being seen today for ongoing prenatal care.  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  ----------------------------------------------------------------------------------- Patient reports constipation.   She saw rheumatologist. She was told that she does not believe she has lupus.  She is constipated and is taking a stool softener.  This seems not to have helped.  She continues to take lovenox. She takes bASA, also. Has anatomy scan at [facility] tomorrow Contractions: Not present. Vag. Bleeding: None.  Movement: Present. Denies leaking of fluid.   ----------------------------------------------------------------------------------- The following portions of the patient's history were reviewed and updated as appropriate: allergies, current medications, past family history, past medical history, past social history, past surgical history and problem list. Problem list updated.   Objective  Last menstrual period 11/26/2018. Pregravid weight 122 lb (55.3 kg) Total Weight Gain 5 lb (2.268 kg) Urinalysis:      Fetal Status:     Movement: Present     Physical Exam could not be performed. Because of the COVID-19 outbreak this visit was performed over the phone and not in person.   Assessment   33 y.o. G2P1001 at [redacted]w[redacted]d by  09/02/2019, by Last Menstrual Period presenting for routine prenatal visit  Plan   pregnancy Problems (from 01/21/19 to present)    Problem Noted Resolved   History of prior pregnancy with SGA newborn 02/08/2019 by Conard Novak, MD No   Supervision of high risk pregnancy, antepartum 01/21/2019 by Conard Novak, MD No   Overview Signed 03/08/2019 10:00 AM by Conard Novak, MD    Clinic [facility] Labs  Dating L/8 Blood type: O/Positive/-- (01/29 1556)   Genetic Screen AFP: [ ]  offer    NIPS: diploid XY Antibody:Negative (01/29 1556)  Anatomic [place]  Rubella: 0.95 (01/29 1556)  Varicella: Immune  GTT Third trimester:  RPR: Non Reactive (01/29 1556)   Rhogam n/a HBsAg: Negative (01/29 1556)   TDaP vaccine                       Flu Shot: HIV: Non Reactive (01/29 1556)   Baby Food                                GBS:   Contraception  Pap:  NILM, HPV negative  CBB     CS/VBAC    Support Person            Positive ANA (antinuclear antibody) 07/06/2016 by Marvel PlanXu, Jindong, MD No   Anxiety disorder 02/22/2016 by Dessa PhiFunches, Josalyn, MD No   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 ([facility] MD doesn't believe she has SLE.      Cerebral infarction due to embolism of middle cerebral artery (HCC) 01/06/2015 by Marvel PlanXu, Jindong, MD No   Overview Signed 03/08/2019 10:02 AM by Conard NovakJackson, Tanee Henery D, MD     The patient was counseled that pregnancy increases the risk of recurrent stroke 3-4 fold. The absolute risk of recurrent stroke in pregnancy is ~ 1%.   The only antiplatelet agent that is considered safe in pregnancy is low-dose aspirin (81 mg per day).  She was advised to start this in addition to her enoxaparin.  An important contributing factor in the increased risk of stroke in pregnancy is hypercoagulability.  The most comprehensive guidelines addressing prevention of recurrent stroke in pregnancy are the Canadian Heart Association guidelines. They recommend a combination of prophylactic low-molecular-weight heparin (eg enoxaparin 40 mg qd) and low-dose aspirin during pregnancy and for 6 weeks postpartum. Hermelinda Medicus(Schwartz et al, Intl J of Stroke, 2018)  Enoxaparin should be held 12-24 hours prior to scheduled delivery.  Enoxaparin should be restarted 12 hours after a vaginal delivery or 24 hours after a cesarean delivery and continued for 6 weeks.  At the time of active labor and until her enoxaparinis restarted, I would recommend pneumatic compression devices.  Postpartum, she should avoid estrogen containing contraceptives (and Depo-Provera) in the future.Other progestin-only contraceptives have not been shown to increase the risk of arterial or venous thromboembolism (Tepper et al, Contraception, 2016)      Heterozygous factor V Leiden mutation (HCC) 08/12/2014 by Levert FeinsteinGranfortuna, James M, MD No   Overview Signed 08/12/2014  3:48 PM by Levert FeinsteinGranfortuna, James M, MD    07/17/14          Gestational age appropriate obstetric precautions including but not limited to vaginal bleeding, contractions,  leaking of fluid and fetal movement were reviewed in detail with the patient.     Follow Up Instructions: - [facility] - continue with all medications   I discussed the assessment and treatment plan with the patient. The patient was provided an opportunity to ask questions and all were answered. The patient agreed with the plan and demonstrated an understanding of the instructions.   The patient was advised to call back or seek an in-person evaluation if the symptoms worsen or if the condition fails to improve as anticipated.  I provided 15 minutes of non-face-to-face time during this encounter.  Return in about 4 weeks (around 05/01/2019) for Routine Prenatal Appointment.  Thomasene MohairStephen Jaydah Stahle, MD  [facility], [facility] 04/03/2019 3:20 PM

## 2019-04-04 ENCOUNTER — Encounter: Payer: Medicaid Other | Admitting: Obstetrics and Gynecology

## 2019-04-04 ENCOUNTER — Ambulatory Visit
Admission: RE | Admit: 2019-04-04 | Discharge: 2019-04-04 | Disposition: A | Discharge status: Home / Self Care | Payer: Medicaid Other | Source: Ambulatory Visit | Attending: Maternal & Fetal Medicine | Admitting: Maternal & Fetal Medicine

## 2019-04-04 ENCOUNTER — Other Ambulatory Visit: Payer: Self-pay

## 2019-04-04 DIAGNOSIS — Z3A18 18 weeks gestation of pregnancy: Secondary | ICD-10-CM | POA: Diagnosis not present

## 2019-04-04 DIAGNOSIS — O0992 Supervision of high risk pregnancy, unspecified, second trimester: Secondary | ICD-10-CM | POA: Diagnosis not present

## 2019-04-04 DIAGNOSIS — O0990 Supervision of high risk pregnancy, unspecified, unspecified trimester: Secondary | ICD-10-CM | POA: Diagnosis present

## 2019-04-08 ENCOUNTER — Other Ambulatory Visit: Payer: Self-pay | Admitting: Obstetrics and Gynecology

## 2019-04-08 ENCOUNTER — Telehealth: Payer: Self-pay

## 2019-04-08 DIAGNOSIS — M329 Systemic lupus erythematosus, unspecified: Secondary | ICD-10-CM

## 2019-04-08 DIAGNOSIS — O0990 Supervision of high risk pregnancy, unspecified, unspecified trimester: Secondary | ICD-10-CM

## 2019-04-08 DIAGNOSIS — R768 Other specified abnormal immunological findings in serum: Secondary | ICD-10-CM

## 2019-04-08 MED ORDER — ENOXAPARIN SODIUM 40 MG/0.4ML ~~LOC~~ SOLN: 40.0000 mg | SUBCUTANEOUS | 11 refills | Status: DC

## 2019-04-08 NOTE — Telephone Encounter (Signed)
Left detailed msg [facility]

## 2019-04-08 NOTE — Telephone Encounter (Signed)
Pt wants her pharm to be changed from [place] [facility] on [address].  She is running low in lovinox and doesn't want to have to go to [place] to get it.  [phone]

## 2019-04-08 NOTE — Telephone Encounter (Signed)
[facility] and ask if they carry [facility]en[facility]? I've had patients before not able to get [facility] because [facility]'t [facility]

## 2019-04-08 NOTE — Telephone Encounter (Signed)
Can we rx her lovinox?

## 2019-04-08 NOTE — Telephone Encounter (Signed)
Okay [facility]. Please let patient know, Thank you.

## 2019-04-12 NOTE — Telephone Encounter (Signed)
Pt called [facility] this am saying [facility].  [facility] spoke c Clifton Custard who states [employer] denied generic of lovenox.  Tried PA thru CoverMyMeds and hit a wall about ins coverage.  [facility] spoke c female pharmacist who was able to override and was able to get coverage.  Pt aware.

## 2019-05-02 ENCOUNTER — Ambulatory Visit (INDEPENDENT_AMBULATORY_CARE_PROVIDER_SITE_OTHER): Payer: Medicaid Other | Admitting: Obstetrics and Gynecology

## 2019-05-02 ENCOUNTER — Other Ambulatory Visit: Payer: Self-pay

## 2019-05-02 ENCOUNTER — Encounter: Payer: Self-pay | Admitting: Obstetrics and Gynecology

## 2019-05-02 VITALS — BP 104/64 | Wt 135.0 lb

## 2019-05-02 DIAGNOSIS — O0990 Supervision of high risk pregnancy, unspecified, unspecified trimester: Secondary | ICD-10-CM

## 2019-05-02 DIAGNOSIS — O9989 Other specified diseases and conditions complicating pregnancy, childbirth and the puerperium: Secondary | ICD-10-CM

## 2019-05-02 DIAGNOSIS — I63413 Cerebral infarction due to embolism of bilateral middle cerebral arteries: Secondary | ICD-10-CM

## 2019-05-02 DIAGNOSIS — Z131 Encounter for screening for diabetes mellitus: Secondary | ICD-10-CM

## 2019-05-02 DIAGNOSIS — M329 Systemic lupus erythematosus, unspecified: Secondary | ICD-10-CM

## 2019-05-02 DIAGNOSIS — Z113 Encounter for screening for infections with a predominantly sexual mode of transmission: Secondary | ICD-10-CM

## 2019-05-02 DIAGNOSIS — D6851 Activated protein C resistance: Secondary | ICD-10-CM

## 2019-05-02 DIAGNOSIS — Z3A22 22 weeks gestation of pregnancy: Secondary | ICD-10-CM

## 2019-05-02 DIAGNOSIS — Z8759 Personal history of other complications of pregnancy, childbirth and the puerperium: Secondary | ICD-10-CM

## 2019-05-02 DIAGNOSIS — O09292 Supervision of pregnancy with other poor reproductive or obstetric history, second trimester: Secondary | ICD-10-CM

## 2019-05-02 NOTE — Progress Notes (Signed)
Routine Prenatal Care Visit  Subjective  Cristina [name] is a 33 y.o. G2P1001 at 4733w3d being seen today for ongoing prenatal care.  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  ----------------------------------------------------------------------------------- Patient reports heartburn and some food regurgitation.  Has only tried Tums. Contractions: Not present. Vag. Bleeding: None.  Movement: Present. Denies leaking of fluid.  ----------------------------------------------------------------------------------- The following portions of the patient's history were reviewed and updated as appropriate: allergies, current medications, past family history, past medical history, past social history, past surgical history and problem list. Problem list updated.   Objective  Blood pressure 104/64, weight 135 lb (61.2 kg), last menstrual period 11/26/2018. Pregravid weight 122 lb (55.3 kg) Total Weight Gain 13 lb (5.897 kg) Urinalysis: Urine Protein    Urine Glucose    Fetal Status: Fetal Heart Rate (bpm): 155   Movement: Present     General:  Alert, oriented and cooperative. Patient is in no acute distress.  Skin: Skin is warm and dry. No rash noted.   Cardiovascular: Normal heart rate noted  Respiratory: Normal respiratory effort, no problems with respiration noted  Abdomen: Soft, gravid, appropriate for gestational age.  Pain/Pressure: Absent     Pelvic:  Cervical exam deferred        Extremities: Normal range of motion.     Mental Status: Normal mood and affect. Normal behavior. Normal judgment and thought content.   Assessment   33 y.o. G2P1001 at 8033w3d by  09/02/2019, by Last Menstrual Period presenting for routine prenatal visit  Plan   pregnancy Problems (from 01/21/19 to present)    Problem Noted Resolved   History of prior pregnancy with SGA newborn 02/08/2019 by Conard NovakJackson, Banks Chaikin D, MD No   Supervision of high risk pregnancy, antepartum 01/21/2019 by Conard NovakJackson, Eugune Sine D, MD No   Overview Addendum 05/02/2019  2:03 PM by Conard NovakJackson, Day Greb D, MD    Clinic [facility] Labs  Dating L/8 Blood type: O/Positive/-- (01/29 1556)   Genetic Screen AFP: [ ]  offer    NIPS: diploid XY Antibody:Negative (01/29 1556)  Anatomic US Incomplete. 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 Lucas Mallow (rheum) at [facility] (3/16). MD doesn't believe she has SLE.      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 The absolute risk of recurrent stroke in pregnancy is ~ 1%.   The only antiplatelet agent that is considered safe in pregnancy is low-dose aspirin (81 mg per day).  She was advised to start this in addition to her enoxaparin.  An important contributing factor in the increased risk of stroke in pregnancy is hypercoagulability.  The most comprehensive guidelines addressing prevention of recurrent stroke in pregnancy are the Canadian Heart Association guidelines. They recommend a combination of prophylactic low-molecular-weight heparin (eg enoxaparin 40 mg qd) and low-dose aspirin during pregnancy and for 6 weeks postpartum. Hermelinda Medicus et al, Intl J of Stroke, 2018)  Enoxaparin should be held 12-24 hours prior to scheduled delivery.  Enoxaparin should be restarted 12 hours after a vaginal delivery or 24 hours after a cesarean delivery and continued for 6 weeks.  At the time of active labor and until her enoxaparinis restarted, I would recommend pneumatic compression devices.  Postpartum, she should avoid estrogen containing contraceptives (and Depo-Provera) in the future.Other progestin-only contraceptives have not been shown to increase the risk of arterial or venous thromboembolism (Tepper et al, Contraception, 2016)      Heterozygous factor V Leiden mutation (HCC) 08/12/2014 by Levert Feinstein, MD No   Overview Signed 08/12/2014  3:48 PM by Levert Feinstein, MD    07/17/14          Preterm labor symptoms and general obstetric precautions including but not limited to vaginal bleeding, contractions, leaking of fluid and fetal movement were reviewed in detail with the patient. Please refer to After Visit Summary for other counseling recommendations.   - Pepcid for heartburn. If ineffective, may try Prilosec, Nexium, or Prevacid. May need rx for Protonix.  - keep f/u ultrasound  with [facility].   Return in about 4 weeks (around 05/30/2019) for 28 week labs and routine prenatal.  Thomasene Mohair, MD, [facility], [facility] 05/02/2019 2:18 PM

## 2019-05-13 ENCOUNTER — Other Ambulatory Visit: Payer: Self-pay

## 2019-05-13 DIAGNOSIS — O0990 Supervision of high risk pregnancy, unspecified, unspecified trimester: Secondary | ICD-10-CM

## 2019-05-16 ENCOUNTER — Other Ambulatory Visit: Payer: Self-pay

## 2019-05-16 ENCOUNTER — Ambulatory Visit
Admission: RE | Admit: 2019-05-16 | Discharge: 2019-05-16 | Disposition: A | Discharge status: Home / Self Care | Payer: Medicaid Other | Source: Ambulatory Visit | Attending: Obstetrics & Gynecology | Admitting: Obstetrics & Gynecology

## 2019-05-16 DIAGNOSIS — D6851 Activated protein C resistance: Secondary | ICD-10-CM | POA: Insufficient documentation

## 2019-05-16 DIAGNOSIS — O99112 Other diseases of the blood and blood-forming organs and certain disorders involving the immune mechanism complicating pregnancy, second trimester: Secondary | ICD-10-CM | POA: Insufficient documentation

## 2019-05-16 DIAGNOSIS — O0990 Supervision of high risk pregnancy, unspecified, unspecified trimester: Secondary | ICD-10-CM

## 2019-05-16 DIAGNOSIS — Z8673 Personal history of transient ischemic attack (TIA), and cerebral infarction without residual deficits: Secondary | ICD-10-CM | POA: Diagnosis not present

## 2019-05-16 DIAGNOSIS — O0992 Supervision of high risk pregnancy, unspecified, second trimester: Secondary | ICD-10-CM | POA: Insufficient documentation

## 2019-05-16 DIAGNOSIS — Z3A24 24 weeks gestation of pregnancy: Secondary | ICD-10-CM | POA: Insufficient documentation

## 2019-05-30 ENCOUNTER — Other Ambulatory Visit: Payer: Medicaid Other

## 2019-05-30 ENCOUNTER — Ambulatory Visit (INDEPENDENT_AMBULATORY_CARE_PROVIDER_SITE_OTHER): Payer: Medicaid Other | Admitting: Maternal Newborn

## 2019-05-30 ENCOUNTER — Encounter: Payer: Self-pay | Admitting: Maternal Newborn

## 2019-05-30 ENCOUNTER — Other Ambulatory Visit: Payer: Self-pay

## 2019-05-30 VITALS — BP 118/74 | Wt 143.0 lb

## 2019-05-30 DIAGNOSIS — O0990 Supervision of high risk pregnancy, unspecified, unspecified trimester: Secondary | ICD-10-CM

## 2019-05-30 DIAGNOSIS — Z3A26 26 weeks gestation of pregnancy: Secondary | ICD-10-CM | POA: Diagnosis not present

## 2019-05-30 DIAGNOSIS — O09292 Supervision of pregnancy with other poor reproductive or obstetric history, second trimester: Secondary | ICD-10-CM | POA: Diagnosis not present

## 2019-05-30 DIAGNOSIS — Z131 Encounter for screening for diabetes mellitus: Secondary | ICD-10-CM

## 2019-05-30 DIAGNOSIS — Z113 Encounter for screening for infections with a predominantly sexual mode of transmission: Secondary | ICD-10-CM

## 2019-05-30 LAB — POCT URINALYSIS DIPSTICK OB
Glucose, UA: NEGATIVE
POC,PROTEIN,UA: NEGATIVE

## 2019-05-30 NOTE — Progress Notes (Signed)
No vb. No lof. Some swelling. 28 week labs today.

## 2019-05-30 NOTE — Progress Notes (Signed)
Routine Prenatal Care Visit  Subjective  Cristina [name] is a 33 y.o. G2P1001 at [redacted]w[redacted]d being seen today for ongoing prenatal care.  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  ----------------------------------------------------------------------------------- Patient reports some round ligament pain after lifting at work. Swelling in both lower extremities with left greater than right, better when she is off her feet. Contractions: Not present. Vag. Bleeding: None.  Movement: Present. No leaking of fluid.  ----------------------------------------------------------------------------------- The following portions of the patient's history were reviewed and updated as appropriate: allergies, current medications, past family history, past medical history, past social history, past surgical history and problem list. Problem list updated.  Objective  Blood pressure 118/74, weight 143 lb (64.9 kg), last menstrual period 11/26/2018. Pregravid weight 122 lb (55.3 kg) Total Weight Gain 21 lb (9.526 kg) Urinalysis: Urine dipstick shows negative for glucose, protein.  Fetal Status: Fetal Heart Rate (bpm): 152 Fundal Height: 25 cm Movement: Present     General:  Alert, oriented and cooperative. Patient is in no acute distress.  Skin: Skin is warm and dry. No rash noted.   Cardiovascular: Normal heart rate noted  Respiratory:  Normal respiratory effort, no problems with respiration noted  Abdomen: Soft, gravid, appropriate for gestational age. Pain/Pressure: Absent     Pelvic:  Cervical exam deferred        Extremities: Normal range of motion.  Edema: Trace  Mental Status: Normal mood and affect. Normal behavior. Normal judgment and thought content.     Assessment   33 y.o. G2P1001 at [redacted]w[redacted]d, EDD 09/02/2019 by Last Menstrual Period presenting for a routine prenatal visit.  Plan   pregnancy Problems (from 01/21/19 to present)    Problem Noted Resolved   History of prior pregnancy with SGA newborn 02/08/2019 by Conard Novak, MD No   Supervision of high risk pregnancy, antepartum 01/21/2019 by Conard Novak, MD No   Overview Addendum 05/02/2019  2:03 PM by Conard Novak, MD    Clinic [facility] P[facility]atal Labs  Dating L/8 Blood type: O/Positive/-- (01/29 1556)   Genetic Screen AFP: [ ]  offer    NIPS: diploid XY Antibody:Negative (01/29 1556)  Anatomic [place] Incomplete. 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 ([facility] MD doesn't believe she has SLE.      Cerebral infarction  due to embolism of middle cerebral artery (HCC) 01/06/2015 by Marvel PlanXu, Jindong, MD No   Overview Signed 03/08/2019 10:02 AM by Conard NovakJackson, Stephen D, MD     The patient was counseled that pregnancy increases the risk of recurrent stroke 3-4 fold. The absolute risk of recurrent stroke in pregnancy is ~ 1%.   The only antiplatelet agent that is considered safe in pregnancy is low-dose aspirin (81 mg per day).  She was advised to start this in addition to her enoxaparin.  An important contributing factor in the increased risk of stroke in pregnancy is hypercoagulability.  The most comprehensive guidelines addressing prevention of recurrent stroke in pregnancy are the Canadian Heart Association guidelines. They recommend a combination of prophylactic low-molecular-weight heparin (eg enoxaparin 40 mg qd) and low-dose aspirin during pregnancy and for 6 weeks postpartum. Hermelinda Medicus(Schwartz et al, Intl J of Stroke, 2018)  Enoxaparin should be held 12-24 hours prior to scheduled delivery.  Enoxaparin should be restarted 12 hours after a vaginal delivery or 24 hours after a cesarean delivery and continued for 6 weeks.  At the time of active labor and until her enoxaparinis restarted, I would recommend pneumatic compression devices.  Postpartum, she should avoid estrogen containing contraceptives (and Depo-Provera) in the future.Other progestin-only contraceptives have not been shown to increase the risk of arterial or venous thromboembolism (Tepper et al, Contraception, 2016)      Heterozygous factor V Leiden mutation (HCC) 08/12/2014 by Levert FeinsteinGranfortuna, James M, MD No   Overview Signed 08/12/2014  3:48 PM by Levert FeinsteinGranfortuna, James M, MD    07/17/14       Discussed limiting strenuous activities at work and note given with lifting restriction.   [facility] in two weeks; to follow up with us after that visit so we can integrate any changes to her plan of care based on their  recommendations.   Please refer to After Visit Summary for other counseling recommendations.   Return in about 2 weeks (around 06/13/2019) for ROB.  Marcelyn BruinsJacelyn , CNM 05/30/2019

## 2019-05-30 NOTE — Patient Instructions (Signed)

## 2019-05-31 LAB — 28 WEEK RH+PANEL
Basophils Absolute: 0 10*3/uL (ref 0.0–0.2)
Basos: 0 %
EOS (ABSOLUTE): 0 10*3/uL (ref 0.0–0.4)
Eos: 0 %
Gestational Diabetes Screen: 136 mg/dL (ref 65–139)
HIV Screen 4th Generation wRfx: NONREACTIVE
Hematocrit: 32.4 % — ABNORMAL LOW (ref 34.0–46.6)
Hemoglobin: 11.3 g/dL (ref 11.1–15.9)
Immature Grans (Abs): 0 10*3/uL (ref 0.0–0.1)
Immature Granulocytes: 0 %
Lymphocytes Absolute: 1.7 10*3/uL (ref 0.7–3.1)
Lymphs: 25 %
MCH: 33.5 pg — ABNORMAL HIGH (ref 26.6–33.0)
MCHC: 34.9 g/dL (ref 31.5–35.7)
MCV: 96 fL (ref 79–97)
Monocytes Absolute: 0.6 10*3/uL (ref 0.1–0.9)
Monocytes: 8 %
Neutrophils Absolute: 4.5 10*3/uL (ref 1.4–7.0)
Neutrophils: 67 %
Platelets: 199 10*3/uL (ref 150–450)
RBC: 3.37 x10E6/uL — ABNORMAL LOW (ref 3.77–5.28)
RDW: 12.5 % (ref 11.7–15.4)
RPR Ser Ql: NONREACTIVE
WBC: 6.8 10*3/uL (ref 3.4–10.8)

## 2019-06-06 ENCOUNTER — Other Ambulatory Visit: Payer: Self-pay

## 2019-06-06 DIAGNOSIS — O0990 Supervision of high risk pregnancy, unspecified, unspecified trimester: Secondary | ICD-10-CM

## 2019-06-10 ENCOUNTER — Other Ambulatory Visit: Payer: Self-pay

## 2019-06-10 ENCOUNTER — Ambulatory Visit
Admission: RE | Admit: 2019-06-10 | Discharge: 2019-06-10 | Disposition: A | Discharge status: Home / Self Care | Payer: Medicaid Other | Source: Ambulatory Visit | Attending: Maternal & Fetal Medicine | Admitting: Maternal & Fetal Medicine

## 2019-06-10 ENCOUNTER — Inpatient Hospital Stay
Admission: RE | Admit: 2019-06-10 | Discharge: 2019-06-13 | DRG: 832 | Disposition: A | Discharge status: Other Acute Inpatient Hospital | Payer: Medicaid Other | Attending: Obstetrics and Gynecology | Admitting: Obstetrics and Gynecology

## 2019-06-10 ENCOUNTER — Other Ambulatory Visit: Payer: Self-pay | Admitting: Maternal & Fetal Medicine

## 2019-06-10 DIAGNOSIS — L930 Discoid lupus erythematosus: Secondary | ICD-10-CM | POA: Diagnosis present

## 2019-06-10 DIAGNOSIS — O365990 Maternal care for other known or suspected poor fetal growth, unspecified trimester, not applicable or unspecified: Secondary | ICD-10-CM

## 2019-06-10 DIAGNOSIS — O99343 Other mental disorders complicating pregnancy, third trimester: Secondary | ICD-10-CM | POA: Diagnosis present

## 2019-06-10 DIAGNOSIS — Z8759 Personal history of other complications of pregnancy, childbirth and the puerperium: Secondary | ICD-10-CM

## 2019-06-10 DIAGNOSIS — O99113 Other diseases of the blood and blood-forming organs and certain disorders involving the immune mechanism complicating pregnancy, third trimester: Secondary | ICD-10-CM | POA: Diagnosis present

## 2019-06-10 DIAGNOSIS — O0990 Supervision of high risk pregnancy, unspecified, unspecified trimester: Secondary | ICD-10-CM

## 2019-06-10 DIAGNOSIS — O1403 Mild to moderate pre-eclampsia, third trimester: Secondary | ICD-10-CM | POA: Diagnosis present

## 2019-06-10 DIAGNOSIS — D6851 Activated protein C resistance: Secondary | ICD-10-CM | POA: Diagnosis present

## 2019-06-10 DIAGNOSIS — O9989 Other specified diseases and conditions complicating pregnancy, childbirth and the puerperium: Secondary | ICD-10-CM | POA: Diagnosis present

## 2019-06-10 DIAGNOSIS — I63311 Cerebral infarction due to thrombosis of right middle cerebral artery: Secondary | ICD-10-CM

## 2019-06-10 DIAGNOSIS — F0631 Mood disorder due to known physiological condition with depressive features: Secondary | ICD-10-CM

## 2019-06-10 DIAGNOSIS — Z3A28 28 weeks gestation of pregnancy: Secondary | ICD-10-CM

## 2019-06-10 DIAGNOSIS — F419 Anxiety disorder, unspecified: Secondary | ICD-10-CM | POA: Diagnosis present

## 2019-06-10 DIAGNOSIS — Z1159 Encounter for screening for other viral diseases: Secondary | ICD-10-CM

## 2019-06-10 DIAGNOSIS — O365930 Maternal care for other known or suspected poor fetal growth, third trimester, not applicable or unspecified: Principal | ICD-10-CM | POA: Diagnosis present

## 2019-06-10 DIAGNOSIS — Z8673 Personal history of transient ischemic attack (TIA), and cerebral infarction without residual deficits: Secondary | ICD-10-CM

## 2019-06-10 DIAGNOSIS — R768 Other specified abnormal immunological findings in serum: Secondary | ICD-10-CM

## 2019-06-10 DIAGNOSIS — F329 Major depressive disorder, single episode, unspecified: Secondary | ICD-10-CM | POA: Diagnosis present

## 2019-06-10 DIAGNOSIS — K5900 Constipation, unspecified: Secondary | ICD-10-CM | POA: Diagnosis present

## 2019-06-10 LAB — SARS CORONAVIRUS 2 BY RT PCR (HOSPITAL ORDER, PERFORMED IN ~~LOC~~ HOSPITAL LAB): SARS Coronavirus 2: NEGATIVE

## 2019-06-10 LAB — CBC WITH DIFFERENTIAL/PLATELET
Abs Immature Granulocytes: 0.02 10*3/uL (ref 0.00–0.07)
Basophils Absolute: 0 10*3/uL (ref 0.0–0.1)
Basophils Relative: 0 %
Eosinophils Absolute: 0 10*3/uL (ref 0.0–0.5)
Eosinophils Relative: 1 %
HCT: 33.4 % — ABNORMAL LOW (ref 36.0–46.0)
Hemoglobin: 11.5 g/dL — ABNORMAL LOW (ref 12.0–15.0)
Immature Granulocytes: 0 %
Lymphocytes Relative: 24 %
Lymphs Abs: 1.7 10*3/uL (ref 0.7–4.0)
MCH: 33.6 pg (ref 26.0–34.0)
MCHC: 34.4 g/dL (ref 30.0–36.0)
MCV: 97.7 fL (ref 80.0–100.0)
Monocytes Absolute: 0.3 10*3/uL (ref 0.1–1.0)
Monocytes Relative: 5 %
Neutro Abs: 4.9 10*3/uL (ref 1.7–7.7)
Neutrophils Relative %: 70 %
Platelets: 179 10*3/uL (ref 150–400)
RBC: 3.42 MIL/uL — ABNORMAL LOW (ref 3.87–5.11)
RDW: 13.1 % (ref 11.5–15.5)
WBC: 7 10*3/uL (ref 4.0–10.5)
nRBC: 0 % (ref 0.0–0.2)

## 2019-06-10 LAB — COMPREHENSIVE METABOLIC PANEL
ALT: 19 U/L (ref 0–44)
AST: 22 U/L (ref 15–41)
Albumin: 2.9 g/dL — ABNORMAL LOW (ref 3.5–5.0)
Alkaline Phosphatase: 65 U/L (ref 38–126)
Anion gap: 8 (ref 5–15)
BUN: 13 mg/dL (ref 6–20)
CO2: 22 mmol/L (ref 22–32)
Calcium: 9.1 mg/dL (ref 8.9–10.3)
Chloride: 106 mmol/L (ref 98–111)
Creatinine, Ser: 0.74 mg/dL (ref 0.44–1.00)
GFR calc Af Amer: >60 mL/min (ref >60)
GFR calc non Af Amer: >60 mL/min (ref >60)
Glucose, Bld: 121 mg/dL — ABNORMAL HIGH (ref 70–99)
Potassium: 4.1 mmol/L (ref 3.5–5.1)
Sodium: 136 mmol/L (ref 135–145)
Total Bilirubin: 0.3 mg/dL (ref 0.3–1.2)
Total Protein: 6.3 g/dL — ABNORMAL LOW (ref 6.5–8.1)

## 2019-06-10 LAB — PROTEIN / CREATININE RATIO, URINE
Creatinine, Urine: 24 mg/dL
Protein Creatinine Ratio: 4.25 mg/mg{Cre} — ABNORMAL HIGH (ref 0.00–0.15)
Total Protein, Urine: 102 mg/dL

## 2019-06-10 MED ORDER — ENOXAPARIN SODIUM 40 MG/0.4ML ~~LOC~~ SOLN
40.0000 mg | SUBCUTANEOUS | Status: DC
  Administered 2019-06-11 – 2019-06-13 (×3): 40 mg via SUBCUTANEOUS
  Filled 2019-06-10 (×3): qty 0.4

## 2019-06-10 MED ORDER — ASPIRIN 81 MG PO CHEW
81.0000 mg | CHEWABLE_TABLET | Freq: Every day | ORAL | Status: DC
  Filled 2019-06-10: qty 1

## 2019-06-10 MED ORDER — PRENATAL PLUS 27-1 MG PO TABS
1.0000 | ORAL_TABLET | Freq: Every morning | ORAL | Status: DC
  Administered 2019-06-10 – 2019-06-12 (×3): 1 via ORAL
  Filled 2019-06-10 (×4): qty 1

## 2019-06-10 MED ORDER — BETAMETHASONE SOD PHOS & ACET 6 (3-3) MG/ML IJ SUSP
12.0000 mg | INTRAMUSCULAR | Status: AC
  Administered 2019-06-10 – 2019-06-11 (×2): 12 mg via INTRAMUSCULAR

## 2019-06-10 MED ORDER — ASPIRIN 81 MG PO CHEW
81.0000 mg | CHEWABLE_TABLET | Freq: Every morning | ORAL | Status: DC
  Administered 2019-06-10 – 2019-06-12 (×3): 81 mg via ORAL
  Filled 2019-06-10 (×2): qty 1

## 2019-06-10 MED ORDER — PRENATAL PLUS 27-1 MG PO TABS: 1.0000 | ORAL_TABLET | Freq: Every day | ORAL | Status: DC

## 2019-06-10 MED ORDER — BETAMETHASONE SOD PHOS & ACET 6 (3-3) MG/ML IJ SUSP
INTRAMUSCULAR | Status: AC
  Filled 2019-06-10: qty 5

## 2019-06-10 NOTE — OB Triage Note (Signed)
Sent from [place] [facility] for NST and steroids. Denies any pain, bleeding, or LOF. Reports positive fetal movement. BP elevated, Pt feet crossed, instructed to uncross and will repeat in 15 mins. Other vitals WNL. Will continue to monitor.

## 2019-06-10 NOTE — H&P (Signed)
OB History & Physical   History of Present Illness:  Chief Complaint: fetal growth restriction with abnormal dopplers in DP clinic today  HPI:  Cristina [name] is a 33 y.o. G2P1001 female at 628w0d dated by LMP.  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    She denies contractions.   She denies leakage of fluid.   She denies vaginal bleeding.   She reports fetal movement. She denies headache, visual disturbances or epigastric pain.  Maternal Medical History:   Past Medical History:  Diagnosis Date  . Anxiety   . Bicornate uterus complicating pregnancy 02/07/2019  . Connective tissue disease (HCC)   . Depression   . Endocarditis of mitral valve    libman sachs?  . Heterozygous factor V Leiden mutation (HCC) 08/12/2014   07/17/14  . Intrauterine growth restriction of newborn   . Stroke [facility](HCC) 06/2014    Past Surgical History:  Procedure Laterality Date  . right ear surgery    . TEE WITHOUT CARDIOVERSION N/A 07/18/2014   Procedure: TRANSESOPHAGEAL ECHOCARDIOGRAM (TEE);  Surgeon: Chrystie NoseKenneth C. Hilty, MD;  [facility];  Service: Cardiovascular;  Laterality: N/A;  . TEE WITHOUT CARDIOVERSION N/A 01/13/2015   Procedure: TRANSESOPHAGEAL ECHOCARDIOGRAM (TEE);  Surgeon: Donato SchultzMark Skains, MD;  [facility];  Service: Cardiovascular;  Laterality: N/A;    No Known Allergies  Prior to Admission medications   Medication Sig Start Date End Date Taking?  Authorizing Provider  aspirin 81 MG chewable tablet Chew 81 mg by mouth daily.   Yes [provider]  enoxaparin (LOVENOX) 40 MG/0.4ML injection Inject 0.4 mLs (40 mg total) into the skin daily. 04/08/19  Yes Schuman, Jaquelyn Bitterhristanna R, MD  Prenatal Multivit-Min-Fe-FA (PRENATAL VITAMINS PO) Take by mouth.   Yes [provider]    OB History  Gravida Para Term Preterm AB Living  2 1 1     1   SAB TAB Ectopic Multiple Live Births          1    # Outcome Date GA Lbr Len/2nd Weight Sex Delivery Anes PTL Lv  2 Current           1 Term 08/01/12 6106w0d  1843 g M Vag-Spont  N LIV     Complications: IUGR (intrauterine growth restriction) affecting care of mother    Prenatal care site: [facility]  Social History: She  reports that she quit smoking about 5 months ago. Her smoking use included cigarettes. She has a 2.50 pack-year smoking history. She has never used smokeless tobacco. She reports previous alcohol use of about 1.0 standard drinks of alcohol per week. She reports that she does not use drugs.  Family History: family history includes Alcohol abuse in her father; CAD in her mother; COPD in her mother; Cancer in her paternal grandfather; Heart disease in her maternal grandfather and paternal grandmother; Stroke in her maternal uncle, paternal grandfather, and another family member.    Review of Systems:  Review of Systems  Constitutional: Negative.   HENT: Negative.   Eyes: Negative.   Respiratory: Negative.   Cardiovascular: Negative.  Gastrointestinal: Negative.   Genitourinary: Negative.   Musculoskeletal: Negative.   Skin: Negative.   Neurological: Negative.   Endo/Heme/Allergies: Negative.   Psychiatric/Behavioral: Negative.     [facility] earlier today:  ----------------------------------------------------------------------  OBSTETRICS REPORT                       (Signed Final 06/10/2019 05:04  pm) ---------------------------------------------------------------------- PATIENT INFO:  ID #:       [id number]                          D.O.B.:  04-Nov-1986 (32 yrs)  Name:       Cristina AuerJENNIFER L [name]               Visit Date: 06/10/2019 04:29 pm ---------------------------------------------------------------------- PERFORMED BY:  Performed By:     Ceasar MonsJennifer Broe          Referred By:      Vincente PoliHRISTANNA R                    Sonographer                              SCHUMAN ---------------------------------------------------------------------- SERVICE(S) PROVIDED:   US MFM OB FOLLOW UP                                  [id number]  ---------------------------------------------------------------------- INDICATIONS:   [redacted] weeks gestation of pregnancy                Z3A.28  ---------------------------------------------------------------------- FETAL EVALUATION:  Num Of Fetuses:         1  Fetal Heart Rate(bpm):  160  Cardiac Activity:       Present  Presentation:           Cephalic  Placenta:               Posterior  AFI Sum(cm)     %Tile       Largest Pocket(cm)  9.3             5           2.86  RUQ(cm)       RLQ(cm)       LUQ(cm)        LLQ(cm)  1.95          2.57          1.92           2.86 ---------------------------------------------------------------------- BIOPHYSICAL EVALUATION:  Amniotic F.V:   Within normal limits       F. Tone:        Observed  F. Movement:    Observed                   Score:          8/8  F. Breathing:   Observed ---------------------------------------------------------------------- BIOMETRY:  BPD:      70.3  mm     G. Age:  28w 2d         46  %    CI:        77.06   %    70 - 86  FL/HC:      19.1   %    18.8 - 20.6  HC:      253.6  mm     G. Age:  27w 4d         11  %    HC/AC:      1.22        1.05 - 1.21  AC:      208.7  mm     G. Age:  25w 3d        < 3  %    FL/BPD:     68.8   %    71 - 87  FL:       48.4   mm     G. Age:  26w 2d          4  %    FL/AC:      23.2   %    20 - 24  HUM:      44.2  mm     G. Age:  26w 2d          7  %  Est. FW:     888  gm    1 lb 15 oz     < 3  % ---------------------------------------------------------------------- GESTATIONAL AGE:  LMP:           28w 0d        Date:  11/26/18                 EDD:   09/02/19  U/S Today:     26w 6d                                        EDD:   09/10/19  Best:          Eden Emms28w 0d     Det. By:  LMP  (11/26/18)          EDD:   09/02/19 ---------------------------------------------------------------------- ANATOMY:  Cranium:               Not seen well due      Stomach:                Seen                         to position  Heart:                 4-Chamber view         Kidneys:                Normal appearance                         appears normal  RVOT:                  Normal appearance      Bladder:                Seen  LVOT:                  Normal appearance      Spine:                  Normal appearance  Diaphragm:  Within Normal Limits ---------------------------------------------------------------------- DOPPLER - FETAL VESSELS:  Umbilical Artery   S/D     %tile  6.71    > 97.5 ---------------------------------------------------------------------- CERVIX UTERUS ADNEXA:  Cervix  Length:            3.4  cm. ---------------------------------------------------------------------- IMPRESSION:  Thank you for referring your patient  for a fetal growth  evaluation.  Her history is significant for fetal growth  restriction and delivery at term.  There is a singleton gestation at [redacted] weeks gestation.  The estimated fetal weight is at less than the third percentile.  The follow up fetal anatomy is unremarkable.  The BPP is 8/8.  Intermittent absent end diastolic flow is seen.  The s/d ratio is  elevated at 6.7 when diasotlic flow is present.  Findings were reviewed.  Severe fetal growth restriction is present.   Recommend transfer to labor and delviery for fetal monitoring  and antenatal steroids.  If testing remains reassuring after 1-2  hours, NST can be repeated every 8 hours.  If testing is  concerning, she can be transferred to [facility] for further  management due to gestaitonal age and efw.  Recommend follow up bpp/afi/dopplers here on thursday.  Repeat antenatl testing twice weekly (nst weekly and  nst/afi/dopplers weekly) and repeat growth in 3 weeks.  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  Thank you for allowing [place] to participate in your patient's care.  Please do not hesitate to contact us if we can be of further  assistance. ----------------------------------------------------------------------                   Manfred Shirts, MD Electronically Signed Final Report   06/10/2019 05:04 pm ----------------------------------------------------------------------   Physical Exam:  Vital Signs: BP (!) 155/85 (BP Location: Right Arm)   Pulse (!) 57   Temp 98 F (36.7 C) (Oral)   Resp 16   Ht 5\' 3"  (1.6 m)   Wt 66.7 kg   LMP 11/26/2018   BMI 26.04 kg/m  Constitutional: Well nourished, well developed female in no acute distress.  HEENT: normal Skin: Warm and dry.  Cardiovascular: Regular rate and rhythm.   Extremity: no edema Respiratory: Clear to auscultation bilateral. Normal respiratory effort Abdomen: FHT present Back: no CVAT Neuro: DTRs 2+, Cranial nerves grossly intact Psych: Alert and Oriented x3. No memory deficits. Normal mood and affect.  MS: normal gait, normal bilateral lower extremity ROM/strength/stability.   Pertinent Results:  Prenatal Labs: Blood type/Rh O positive  Antibody screen negative  Rubella Not immune  Varicella Immune    RPR Non-reactive  HBsAg negative  HIV negative  GC negative  Chlamydia negative  Genetic screening Cell free DNA  normal screen/female  1 hour GTT 136 on 6/4  3 hour GTT NA  GBS Not yet screened   Baseline FHR: 140 beats/min   Variability: moderate   Accelerations: present   Decelerations: absent Contractions: absent  Overall assessment: Reassuring    No results found for: SARSCOV2NAA] results are pending  Assessment:  Cristina [name] is a 33 y.o. G21P1001 female at [redacted]w[redacted]d with severe fetal growth restriction, intermittent absent end diastolic flow and elevated SD ratio, normal BPP.   Plan:  1. Admit to Labor & Delivery for observation per DP for 24 hours fetal monitoring and steroids for fetal lung maturity  2. Regular diet 3. CBC, CMP, urine Protein Creatinine  Ratio for elevated blood pressures while in observation   4. Fetal well-being: reassuring 5. Betamethasone IM 12 mg times 2 doses 24 hours apart 6. 2 hours of fetal monitoring followed by NST Q 8 hours for 24 hours 7. Transfer to [facility] for any concerns with fetal monitoring 8. Follow up per DP: NST twice weekly, NST/AFI/Dopplers weekly, repeat growth in 3 weeks and delivery by 34 weeks or sooner if testing worsens.   Tresea Mall, CNM 06/10/2019 7:22 PM

## 2019-06-10 NOTE — OB Triage Note (Signed)
Pt is a 33yo G2P1001 at [redacted]w[redacted]d that was sent over from [facility] for NST, BMZ. Initial FHT 152 with monitors applied and assessing.

## 2019-06-10 NOTE — Progress Notes (Signed)
Pt transported to OBS #4 via wheelchair to [facility] per Dr. Diamantina Monks for monitoring and NST.  Dr. Glennon Mac spoke with Dr. Diamantina Monks via telephone already about pt Plan of Care.  Report given to Colan Neptune, RN.

## 2019-06-11 ENCOUNTER — Encounter: Payer: Medicaid Other | Admitting: Obstetrics and Gynecology

## 2019-06-11 ENCOUNTER — Encounter: Payer: Self-pay | Admitting: Certified Nurse Midwife

## 2019-06-11 DIAGNOSIS — O1403 Mild to moderate pre-eclampsia, third trimester: Secondary | ICD-10-CM | POA: Diagnosis not present

## 2019-06-11 DIAGNOSIS — Z3A28 28 weeks gestation of pregnancy: Secondary | ICD-10-CM | POA: Diagnosis not present

## 2019-06-11 DIAGNOSIS — O365930 Maternal care for other known or suspected poor fetal growth, third trimester, not applicable or unspecified: Secondary | ICD-10-CM | POA: Diagnosis not present

## 2019-06-11 DIAGNOSIS — F419 Anxiety disorder, unspecified: Secondary | ICD-10-CM | POA: Diagnosis present

## 2019-06-11 DIAGNOSIS — O9989 Other specified diseases and conditions complicating pregnancy, childbirth and the puerperium: Secondary | ICD-10-CM | POA: Diagnosis present

## 2019-06-11 DIAGNOSIS — O1493 Unspecified pre-eclampsia, third trimester: Secondary | ICD-10-CM | POA: Diagnosis not present

## 2019-06-11 DIAGNOSIS — O99113 Other diseases of the blood and blood-forming organs and certain disorders involving the immune mechanism complicating pregnancy, third trimester: Secondary | ICD-10-CM | POA: Diagnosis present

## 2019-06-11 DIAGNOSIS — F329 Major depressive disorder, single episode, unspecified: Secondary | ICD-10-CM | POA: Diagnosis present

## 2019-06-11 DIAGNOSIS — D6851 Activated protein C resistance: Secondary | ICD-10-CM | POA: Diagnosis present

## 2019-06-11 DIAGNOSIS — K5900 Constipation, unspecified: Secondary | ICD-10-CM | POA: Diagnosis present

## 2019-06-11 DIAGNOSIS — L930 Discoid lupus erythematosus: Secondary | ICD-10-CM | POA: Diagnosis present

## 2019-06-11 DIAGNOSIS — Z8673 Personal history of transient ischemic attack (TIA), and cerebral infarction without residual deficits: Secondary | ICD-10-CM | POA: Diagnosis not present

## 2019-06-11 DIAGNOSIS — Z1159 Encounter for screening for other viral diseases: Secondary | ICD-10-CM | POA: Diagnosis not present

## 2019-06-11 DIAGNOSIS — O99343 Other mental disorders complicating pregnancy, third trimester: Secondary | ICD-10-CM | POA: Diagnosis present

## 2019-06-11 MED ORDER — ACETAMINOPHEN 500 MG PO TABS
ORAL_TABLET | ORAL | Status: AC
  Filled 2019-06-11: qty 2

## 2019-06-11 MED ORDER — ACETAMINOPHEN 500 MG PO TABS
1000.0000 mg | ORAL_TABLET | Freq: Four times a day (QID) | ORAL | Status: DC | PRN
  Administered 2019-06-11: 1000 mg via ORAL

## 2019-06-11 NOTE — Progress Notes (Signed)
L&D Progress NOte  33 year old G2 P1001 admitted yesterday from [facility] for severe IUGR at [redacted] weeks gestation. EFW 1#15oz (<3rd%) with intermittent absent end diastolic flow, 8/8 BPP and reactive NST. Getting NSTs every 8 hours until second BMZ this evening at 6:30 PM. History of heterozygosity for Factor V Leiden and hx of a stroke when on OCPs. Currently on Levonox and ASA daily.  S: Has had a frontal headache all night. She rates the headache pain at 4/10. Has not slept much. Hx of migraines. No nasal or sinus congestion. No CP, SOB, visual changes, nausea or vomiting.  O: BP (!) 141/81   Pulse (!) 57   Temp 98.2 F (36.8 C) (Oral)   Resp 16   Ht 5\' 3"  (1.6 m)   Wt 66.7 kg   LMP 11/26/2018   BMI 26.04 kg/m    Patient Vitals for the past 24 hrs:  BP Temp Temp src Pulse Resp Height Weight  06/11/19 0807 (!) 141/81 98.2 F (36.8 C) Oral (!) 57 - - -  06/11/19 0429 (!) 143/87 98.2 F (36.8 C) Oral 60 16 - -  06/11/19 0128 (!) 142/80 97.9 F (36.6 C) Oral (!) 52 - - -  06/10/19 2008 115/70 - - 99 - - -  06/10/19 1952 140/90 - - 62 - - -  06/10/19 1950 140/90 - - 62 - - -  06/10/19 1935 136/88 - - 61 - - -  06/10/19 1920 (!) 155/85 98 F (36.7 C) Oral (!) 57 16 - -  06/10/19 1905 (!) 148/91 - - 67 - - -  06/10/19 1731 - 97.9 F (36.6 C) Oral (!) 53 15 5\' 3"  (1.6 m) 66.7 kg   General: WF lying in dark room, in NAD   Heart: RRR without murmur Lungs: CTA FHT 0400 this AM: baseline 140 with accelerations to 160s-180s with moderate variability Toco: no contractions seen Extremities: no edema, redness or tenderness Neuro: +2 DTRs, awake, alert and answering questions appropriately.   A/P: IUP at 28 week 1 day Severe IUGR-NSTs every 8 hours until second BMZ this evening H/O stroke and heterozygous for Factor V Leiden- on Lovenox PPX Preeclampsia-currently with headache-Tylenol now, eat breakfast Will discuss POM with Dr Gilman Schmidt  Dalia Heading, CNM

## 2019-06-11 NOTE — Progress Notes (Signed)
Lab results update:  Preeclampsia labs done due to elevated blood pressure while in observation  Results for Cristina [name], Cristina [name] (MRN [id number]) as of 06/11/2019 07:49  Ref. Range 06/10/2019 19:53 06/10/2019 20:02  COMPREHENSIVE METABOLIC PANEL Unknown  Rpt (A)  Sodium Latest Ref Range: 135 - 145 mmol/L  136  Potassium Latest Ref Range: 3.5 - 5.1 mmol/L  4.1  Chloride Latest Ref Range: 98 - 111 mmol/L  106  CO2 Latest Ref Range: 22 - 32 mmol/L  22  Glucose Latest Ref Range: 70 - 99 mg/dL  121 (H)  BUN Latest Ref Range: 6 - 20 mg/dL  13  Creatinine Latest Ref Range: 0.44 - 1.00 mg/dL  0.74  Calcium Latest Ref Range: 8.9 - 10.3 mg/dL  9.1  Anion gap Latest Ref Range: 5 - 15   8  Alkaline Phosphatase Latest Ref Range: 38 - 126 U/L  65  Albumin Latest Ref Range: 3.5 - 5.0 g/dL  2.9 (L)  AST Latest Ref Range: 15 - 41 U/L  22  ALT Latest Ref Range: 0 - 44 U/L  19  Total Protein Latest Ref Range: 6.5 - 8.1 g/dL  6.3 (L)  Total Bilirubin Latest Ref Range: 0.3 - 1.2 mg/dL  0.3  GFR, Est Non African American Latest Ref Range: >60 mL/min  >60  GFR, Est African American Latest Ref Range: >60 mL/min  >60  WBC Latest Ref Range: 4.0 - 10.5 K/uL  7.0  RBC Latest Ref Range: 3.87 - 5.11 MIL/uL  3.42 (L)  Hemoglobin Latest Ref Range: 12.0 - 15.0 g/dL  11.5 (L)  HCT Latest Ref Range: 36.0 - 46.0 %  33.4 (L)  MCV Latest Ref Range: 80.0 - 100.0 fL  97.7  MCH Latest Ref Range: 26.0 - 34.0 pg  33.6  MCHC Latest Ref Range: 30.0 - 36.0 g/dL  34.4  RDW Latest Ref Range: 11.5 - 15.5 %  13.1  Platelets Latest Ref Range: 150 - 400 K/uL  179  nRBC Latest Ref Range: 0.0 - 0.2 %  0.0  Neutrophils Latest Units: %  70  Lymphocytes Latest Units: %  24  Monocytes Relative Latest Units: %  5  Eosinophil Latest Units: %  1  Basophil Latest Units: %  0  Immature Granulocytes Latest Units: %  0  NEUT# Latest Ref Range: 1.7 - 7.7 K/uL  4.9  Lymphocyte # Latest Ref Range: 0.7 - 4.0 K/uL  1.7  Monocyte # Latest Ref Range:  0.1 - 1.0 K/uL  0.3  Eosinophils Absolute Latest Ref Range: 0.0 - 0.5 K/uL  0.0  Basophils Absolute Latest Ref Range: 0.0 - 0.1 K/uL  0.0  Abs Immature Granulocytes Latest Ref Range: 0.00 - 0.07 K/uL  0.02  Total Protein, Urine Latest Units: mg/dL 102   Protein Creatinine Ratio Latest Ref Range: 0.00 - 0.15 mg/mgCre 4.25 (H)   Creatinine, Urine Latest Units: mg/dL 24    Specimen Information: Nasopharyngeal Swab      Ref Range & Units 1d ago  SARS Coronavirus 2 NEGATIVE NEGATIVE   Comment: (NOTE)  If result is NEGATIVE  SARS-CoV-2 target nucleic acids are NOT DETECTED.  The SARS-CoV-2 RNA is generally detectable in upper and lower  respiratory specimens during the acute phase of infection. The lowest  concentration of SARS-CoV-2 viral copies this assay can detect is 250  copies / mL. A negative result does not preclude SARS-CoV-2 infection  and should not be used as the sole basis for treatment or other  patient management decisions. A negative result may occur with  improper specimen collection / handling, submission of specimen other  than nasopharyngeal swab, presence of viral mutation(s) within the  areas targeted by this assay, and inadequate number of viral copies  (<250 copies / mL). A negative result must be combined with clinical  observations, patient history, and epidemiological information.  If result is POSITIVE  SARS-CoV-2 target nucleic acids are DETECTED.  The SARS-CoV-2 RNA is generally detectable in upper and lower  respiratory specimens during the acute phase of infection. Positive  results are indicative of active infection with SARS-CoV-2. Clinical  correlation with patient history and other diagnostic information is  necessary to determine patient infection status. Positive results do  not rule out bacterial infection or co-infection with other viruses.  If result is PRESUMPTIVE POSTIVE  SARS-CoV-2 nucleic acids MAY BE PRESENT.   A presumptive positive  result was obtained on the submitted specimen  and confirmed on repeat testing. While 2019 novel coronavirus  (SARS-CoV-2) nucleic acids may be present in the submitted sample  additional confirmatory testing may be necessary for epidemiological  and / or clinical management purposes to differentiate between  SARS-CoV-2 and other Sarbecovirus currently known to infect humans.  If clinically indicated additional testing with an alternate test  methodology [id number]) is advised. The SARS-CoV-2 RNA is generally  detectable in upper and lower respiratory specimens during the acute  phase of infection.  The expected result is Negative.  Fact Sheet for Patients: BoilerBrush.com.cyhttps://www.fda.gov/media/136312/download  Fact Sheet for Healthcare Providers:  https://pope.com/https://www.fda.gov/media/136313/download  This test is not yet approved or cleared by the [place] FDA and  has been authorized for detection and/or diagnosis of SARS-CoV-2 by  FDA under an Emergency Use Authorization (EUA). This EUA will remain  in effect (meaning this test can be used) for the duration of the  COVID-19 declaration under Section 564(b)(1) of the Act, 21 U.S.C.  section 360bbb-3(b)(1), unless the authorization is terminated or  revoked sooner.  Performed at [facility], [address] [place],  [place] [id number]   Resulting Agency  [facility]      Specimen Collected: 06/10/19 18:00 Last Resulted: 06/10/19 19:52         Liver enzymes and platelets within normal limits Urine protein creatinine ratio elevated  New diagnosis: Preeclampsia   Parke PoissonJane E. Diezel Mazur, CNM [facility] 06/11/2019, 7:52 AM

## 2019-06-11 NOTE — Progress Notes (Signed)
Called and discussed patient with Dr. Jonni Sanger at [facility] perinatology regarding Cristina [name] and her care. Informed him that she has preeclampsia without severe range blood pressures. Discussed with him options for care including discharge home later today, extended monitoring and repeat US on Thursday, or transfer of patient to [facility] labor and delivery. He was comfortable with the patient remaining at [facility] an being monitored with NSTs every 8 hours. He did not feel like she should be discharged home. He said that she could be transferred to [facility], but that it was not a necessity at this time as their care would also involve observation and repeating the ultrasound on Thursday. If anything changed with her or the infant's status he would be happy to accept the transfer. I had a long conversation with Cristina [name] regarding her current hospitalization, IUGR, preterm delivery, and the plan of care. We discussed the findings of IUGR, absent end diastolic flow and what that means for the infant  as well as transfer. Discussed how it would be the best outcome for the infant if she was transferred before delivery of the baby because transport of a baby can be very stressful on a premature infant. Cristina [name]'s preference at this time is remain at [facility]. She understands that she may need to be transferred in the future.  Neonatology is aware of her presence at the hospital. Continue with close observation and care at this time.   Adrian Prows MD [facility] 06/11/2019 2:03 PM

## 2019-06-12 DIAGNOSIS — O1493 Unspecified pre-eclampsia, third trimester: Secondary | ICD-10-CM

## 2019-06-12 MED ORDER — CALCIUM CARBONATE ANTACID 500 MG PO CHEW
400.0000 mg | CHEWABLE_TABLET | Freq: Three times a day (TID) | ORAL | Status: DC
  Administered 2019-06-12 – 2019-06-13 (×4): 400 mg via ORAL
  Filled 2019-06-12 (×5): qty 2

## 2019-06-12 NOTE — Progress Notes (Signed)
Daily Antepartum Note  Admission Date: 06/10/2019 Current Date: 06/12/2019 9:41 AM  Cristina [name] is a 33 y.o. G2P1001 @ [redacted]w[redacted]d here for continued observation due to severe IUGR and preeclampsia without severe features.  Pregnancy complicated by:  Patient Active Problem List   Diagnosis Date Noted  . Poor fetal growth affecting pregnancy, antepartum 06/10/2019  . History of prior pregnancy with SGA newborn 02/08/2019  . Supervision of high risk pregnancy, antepartum 01/21/2019  . Depo-Provera contraceptive status 06/05/2017  . Cerebral infarction due to thrombosis of right middle cerebral artery ([place]) 07/06/2016  . Positive ANA (antinuclear antibody) 07/06/2016  . 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 Tobacco use disorder 07/06/2016  . Anxiety disorder 02/22/2016  . 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 Lupus (systemic lupus erythematosus) ([place]) 01/06/2015  . Cerebral infarction due to embolism of middle cerebral artery ([place]) 01/06/2015  . Heterozygous factor V Leiden mutation ([place]) 08/12/2014  . Migraine 07/17/2014    Subjective:  Feeling well this morning. Does not have a headache. No visual changes. Denies chest and epigastric pain. Edema is stable. She was able to get some rest. Does not have any pain. Tolerating a regular diet, requests Tums for heartburn. Endorses good fetal movement.  Objective:   Vitals:   06/12/19 0710 06/12/19 0718  BP: 129/73 129/72  Pulse: (!) 55 (!) 55  Resp: 18   Temp: 98.3 F (36.8 C)    Temp:  [98.1 F (36.7 C)-98.4 F (36.9 C)] 98.3 F (36.8 C) (06/17 0710) Pulse Rate:  [52-84] 55 (06/17 0718) Resp:  [16-18] 18 (06/17 0710) BP: (125-146)/(72-96) 129/72 (06/17 0718) Temp (24hrs), Avg:98.3 F (36.8 C), Min:98.1 F (36.7 C), Max:98.4 F (36.9 C)  No intake or output data in the 24 hours ending 06/12/19 0941   Current Vital Signs 24h Vital Sign Ranges  T 98.3 F (36.8 C) Temp  Avg: 98.3 F (36.8 C)  Min: 98.1 F  (36.7 C)  Max: 98.4 F (36.9 C)  BP 129/72 BP  Min: 125/72  Max: 146/75  HR (!) 55 Pulse  Avg: 60.4  Min: 52  Max: 84  RR 18 Resp  Avg: 16.5  Min: 16  Max: 18  SaO2     No data recorded       24 Hour I/O Current Shift I/O  Time Ins Outs No intake/output data recorded. No intake/output data recorded.   Patient Vitals for the past 24 hrs:  BP Temp Temp src Pulse Resp  06/12/19 0718 129/72 - - (!) 55 -  06/12/19 0710 129/73 98.3 F (36.8 C) Oral (!) 55 18  06/12/19 0356 129/73 98.3 F (36.8 C) Oral (!) 52 16  06/12/19 0014 125/72 98.4 F (36.9 C) Oral (!) 55 16  06/11/19 2002 (!) 146/75 98.2 F (36.8 C) Oral (!) 57 16  06/11/19 1818 130/77 98.3 F (36.8 C) Oral 65 -  06/11/19 1207 (!) 141/96 98.1 F (36.7 C) Oral 84 -    Physical exam: General: Well nourished, well developed female in no acute distress. Abdomen: gravid, non-tender Cardiovascular: S1, S2 normal, no murmur, rub or gallop, regular rate and rhythm Respiratory: CTAB Extremities: trace edema in feet/ankles Skin: Warm and dry.   NST last done at 0400, reactive with FHT 130s to 150s  Medications: Current Facility-Administered Medications  Medication Dose Route Frequency Provider Last Rate Last Dose  . acetaminophen (TYLENOL) tablet 1,000 mg  1,000 mg Oral Q6H PRN Dalia Heading, CNM   1,000 mg at  06/11/19 0857  . aspirin chewable tablet 81 mg  81 mg Oral q morning - 10a Tresea MallGledhill, Jane, CNM   81 mg at 06/11/19 2200  . calcium carbonate (TUMS - dosed in mg elemental calcium) chewable tablet 400 mg of elemental calcium  400 mg of elemental calcium Oral TID Oswaldo ConroySchmid, Jacelyn Y, CNM   400 mg of elemental calcium at 06/12/19 0919  . enoxaparin (LOVENOX) injection 40 mg  40 mg Subcutaneous Q24H Tresea MallGledhill, Jane, CNM   40 mg at 06/12/19 [id number]  . prenatal vitamin w/FE, FA (PRENATAL 1 + 1) 27-1 MG tablet 1 tablet  1 tablet Oral q morning - 10a Tresea MallGledhill, Jane, CNM   1 tablet at 06/11/19 2200    Labs:  Recent Labs  Lab  06/10/19 2002  WBC 7.0  HGB 11.5*  HCT 33.4*  PLT 179    Recent Labs  Lab 06/10/19 2002  NA 136  K 4.1  CL 106  CO2 22  BUN 13  CREATININE 0.74  CALCIUM 9.1  PROT 6.3*  BILITOT 0.3  ALKPHOS 65  ALT 19  AST 22  GLUCOSE 121*    Assessment & Plan:  32 year ld G2P1001 with IUGR and pre-eclampsia without severe features.  Except for one mild range pressure at 2002, she has had normal BP and no symptoms overnight. FWB reassuring, continue NST Q 8 hours. Remain in observation today and tonight as long as no adverse change in maternal and fetal status. Plan for repeat ultrasound tomorrow and decision about transfer or other disposition with MFM.  Marcelyn BruinsJacelyn Schmid, CNM 06/12/2019  9:49 AM

## 2019-06-12 NOTE — Plan of Care (Signed)
  Problem: Education: Goal: Knowledge of General Education information will improve Description: Including pain rating scale, medication(s)/side effects and non-pharmacologic comfort measures Outcome: Completed/Met   Problem: Health Behavior/Discharge Planning: Goal: Ability to manage health-related needs will improve Outcome: Completed/Met   Problem: Activity: Goal: Risk for activity intolerance will decrease Outcome: Completed/Met   Problem: Nutrition: Goal: Adequate nutrition will be maintained Outcome: Completed/Met   Problem: Coping: Goal: Level of anxiety will decrease Outcome: Completed/Met   Problem: Elimination: Goal: Will not experience complications related to bowel motility Outcome: Not Applicable Goal: Will not experience complications related to urinary retention Outcome: Not Applicable   Problem: Safety: Goal: Ability to remain free from injury will improve Outcome: Completed/Met

## 2019-06-13 ENCOUNTER — Inpatient Hospital Stay: Admission: RE | Admit: 2019-06-13 | Payer: Medicaid Other | Source: Ambulatory Visit

## 2019-06-13 LAB — COMPREHENSIVE METABOLIC PANEL
ALT: 23 U/L (ref 0–44)
AST: 29 U/L (ref 15–41)
Albumin: 2.6 g/dL — ABNORMAL LOW (ref 3.5–5.0)
Alkaline Phosphatase: 63 U/L (ref 38–126)
Anion gap: 6 (ref 5–15)
BUN: 13 mg/dL (ref 6–20)
CO2: 24 mmol/L (ref 22–32)
Calcium: 9.3 mg/dL (ref 8.9–10.3)
Chloride: 105 mmol/L (ref 98–111)
Creatinine, Ser: 0.78 mg/dL (ref 0.44–1.00)
GFR calc Af Amer: >60 mL/min (ref >60)
GFR calc non Af Amer: >60 mL/min (ref >60)
Glucose, Bld: 82 mg/dL (ref 70–99)
Potassium: 4.2 mmol/L (ref 3.5–5.1)
Sodium: 135 mmol/L (ref 135–145)
Total Bilirubin: 0.5 mg/dL (ref 0.3–1.2)
Total Protein: 6 g/dL — ABNORMAL LOW (ref 6.5–8.1)

## 2019-06-13 LAB — CBC
HCT: 34 % — ABNORMAL LOW (ref 36.0–46.0)
Hemoglobin: 11.7 g/dL — ABNORMAL LOW (ref 12.0–15.0)
MCH: 33.8 pg (ref 26.0–34.0)
MCHC: 34.4 g/dL (ref 30.0–36.0)
MCV: 98.3 fL (ref 80.0–100.0)
Platelets: 206 10*3/uL (ref 150–400)
RBC: 3.46 MIL/uL — ABNORMAL LOW (ref 3.87–5.11)
RDW: 13.2 % (ref 11.5–15.5)
WBC: 10 10*3/uL (ref 4.0–10.5)
nRBC: 0.3 % — ABNORMAL HIGH (ref 0.0–0.2)

## 2019-06-13 MED ORDER — DOCUSATE SODIUM 100 MG PO CAPS
100.0000 mg | ORAL_CAPSULE | Freq: Every day | ORAL | Status: DC
  Administered 2019-06-13: 100 mg via ORAL
  Filled 2019-06-13: qty 1

## 2019-06-13 NOTE — Progress Notes (Signed)
RN spoke with Loma Sousa from [facility] about patient plan for repeat BPP & dopplers today @ 1400. Will communicate around 1345 for patient transport.

## 2019-06-13 NOTE — Discharge Summary (Signed)
RN gave report and turned patient care over to [facility] transport. Pt being transported to [place].

## 2019-06-13 NOTE — Discharge Summary (Signed)
Physician Discharge Summary  Patient ID: [name] MRN: [id number] DOB/AGE: 33/04/1986 32 y.o.  Admit date: 06/10/2019 Discharge date: 06/13/2019  Admission Diagnoses: IUP at 28 weeks with severe IUGR Abnormal fetal Dopplers History of Stroke  Discharge Diagnoses: same as above Preeclampsia without severe features  Discharged Condition: stable  Hospital Course: 33 year old G2 P1001 [facility] severe IUGR at [redacted] weeks gestation. EFW 1#15oz (<3rd%) with intermittent absent end diastolic flow, 8/8 BPP and reactive NST. She was originally admitted for fetal surveillance and to receive betamethasone injections. After admission, she was noted to have blood pressure elevations in the mild range and preeclampsia labs were significant for a PC ratio of 4000 mgm protein. Her BUN, creatinine and liver enzymes were normal. Non stress tests remained reactive. She continued on her Lovenox PPX and an ASA 81 mgm daily that she was begun on during her pregnancy due to her history of heterozygosity for Factor V Leiden and hx of a stroke when on OCPs. Her blood pressures became persistently elevated and she had a transient blood pressure in the severe range this AM. Dr Jean RosenthalJackson spoke with Dr Sofie HartiganEllistadt [facility] due to worsening blood pressures and increased risk of needing delivery prior to 30 weeks and a higher level NICU.  Dr Earl GalaFederspiel was the accepting Fellow [facility].  Consults: Perinatalogy  Significant Diagnostic Studies:  Results for orders placed or performed during the hospital encounter of 06/10/19 (from the past 72 hour(s))  SARS Coronavirus 2 (CEPHEID - Performed [facility] lab), Hosp Order     Status: None   Collection Time: 06/10/19  6:00 PM   Specimen: Nasopharyngeal Swab  Result Value Ref Range   SARS Coronavirus 2 NEGATIVE NEGATIVE    Comment: (NOTE) If result is NEGATIVE SARS-CoV-2 target nucleic acids are NOT  DETECTED. The SARS-CoV-2 RNA is generally detectable in upper and lower  respiratory specimens during the acute phase of infection. The lowest  concentration of SARS-CoV-2 viral copies this assay can detect is 250  copies / mL. A negative result does not preclude SARS-CoV-2 infection  and should not be used as the sole basis for treatment or other  patient management decisions.  A negative result may occur with  improper specimen collection / handling, submission of specimen other  than nasopharyngeal swab, presence of viral mutation(s) within the  areas targeted by this assay, and inadequate number of viral copies  (<250 copies / mL). A negative result must be combined with clinical  observations, patient history, and epidemiological information. If result is POSITIVE SARS-CoV-2 target nucleic acids are DETECTED. The SARS-CoV-2 RNA is generally detectable in upper and lower  respiratory specimens dur ing the acute phase of infection.  Positive  results are indicative of active infection with SARS-CoV-2.  Clinical  correlation with patient history and other diagnostic information is  necessary to determine patient infection status.  Positive results do  not rule out bacterial infection or co-infection with other viruses. If result is PRESUMPTIVE POSTIVE SARS-CoV-2 nucleic acids MAY BE PRESENT.   A presumptive positive result was obtained on the submitted specimen  and confirmed on repeat testing.  While 2019 novel coronavirus  (SARS-CoV-2) nucleic acids may be present in the submitted sample  additional confirmatory testing may be necessary for epidemiological  and / or clinical management purposes  to differentiate between  SARS-CoV-2 and other Sarbecovirus currently known to infect humans.  If clinically indicated additional testing with  an alternate test  methodology [phone]) is advised. The SARS-CoV-2 RNA is generally  detectable in upper and lower respiratory sp ecimens during  the acute  phase of infection. The expected result is Negative. Fact Sheet for Patients:  BoilerBrush.com.cy Fact Sheet for Healthcare Providers: https://pope.com/ This test is not yet approved or cleared by the [place] FDA and has been authorized for detection and/or diagnosis of SARS-CoV-2 by FDA under an Emergency Use Authorization (EUA).  This EUA will remain in effect (meaning this test can be used) for the duration of the COVID-19 declaration under Section 564(b)(1) of the Act, 21 U.S.C. section 360bbb-3(b)(1), unless the authorization is terminated or revoked sooner. Performed at [facility], [address] [place], [place] [address]   Protein / creatinine ratio, urine     Status: Abnormal   Collection Time: 06/10/19  7:53 PM  Result Value Ref Range   Creatinine, Urine 24 mg/dL   Total Protein, Urine 102 mg/dL    Comment: NO NORMAL RANGE ESTABLISHED FOR THIS TEST   Protein Creatinine Ratio 4.25 (H) 0.00 - 0.15 mg/mg[Cre]    Comment: Performed at [facility], [address] [place], [place] [address]  CBC with Differential/Platelet     Status: Abnormal   Collection Time: 06/10/19  8:02 PM  Result Value Ref Range   WBC 7.0 4.0 - 10.5 K/uL   RBC 3.42 (L) 3.87 - 5.11 MIL/uL   Hemoglobin 11.5 (L) 12.0 - 15.0 g/dL   HCT 47.8 (L) 29.5 - 62.1 %   MCV 97.7 80.0 - 100.0 fL   MCH 33.6 26.0 - 34.0 pg   MCHC 34.4 30.0 - 36.0 g/dL   RDW 30.8 65.7 - 84.6 %   Platelets 179 150 - 400 K/uL   nRBC 0.0 0.0 - 0.2 %   Neutrophils Relative % 70 %   Neutro Abs 4.9 1.7 - 7.7 K/uL   Lymphocytes Relative 24 %   Lymphs Abs 1.7 0.7 - 4.0 K/uL   Monocytes Relative 5 %   Monocytes Absolute 0.3 0.1 - 1.0 K/uL   Eosinophils Relative 1 %   Eosinophils Absolute 0.0 0.0 - 0.5 K/uL   Basophils Relative 0 %   Basophils Absolute 0.0 0.0 - 0.1 K/uL   Immature Granulocytes 0 %   Abs Immature Granulocytes 0.02 0.00 - 0.07 K/uL     Comment: Performed at [facility], [address] [place], [place] [address]  Comprehensive metabolic panel     Status: Abnormal   Collection Time: 06/10/19  8:02 PM  Result Value Ref Range   Sodium 136 135 - 145 mmol/L   Potassium 4.1 3.5 - 5.1 mmol/L   Chloride 106 98 - 111 mmol/L   CO2 22 22 - 32 mmol/L   Glucose, Bld 121 (H) 70 - 99 mg/dL   BUN 13 6 - 20 mg/dL   Creatinine, Ser 2.84 0.44 - 1.00 mg/dL   Calcium 9.1 8.9 - 13.2 mg/dL   Total Protein 6.3 (L) 6.5 - 8.1 g/dL   Albumin 2.9 (L) 3.5 - 5.0 g/dL   AST 22 15 - 41 U/L   ALT 19 0 - 44 U/L   Alkaline Phosphatase 65 38 - 126 U/L   Total Bilirubin 0.3 0.3 - 1.2 mg/dL   GFR calc non Af Amer >60 >60 mL/min   GFR calc Af Amer >60 >60 mL/min   Anion gap 8 5 - 15    Comment: Performed at [facility], [address] [place], [place] [address]  Comprehensive metabolic panel     Status: Abnormal   Collection Time: 06/13/19  8:11 AM  Result Value Ref Range   Sodium 135 135 - 145 mmol/L   Potassium 4.2 3.5 - 5.1 mmol/L   Chloride 105 98 - 111 mmol/L   CO2 24 22 - 32 mmol/L   Glucose, Bld 82 70 - 99 mg/dL   BUN 13 6 - 20 mg/dL   Creatinine, Ser 0.78 0.44 - 1.00 mg/dL   Calcium 9.3 8.9 - 10.3 mg/dL   Total Protein 6.0 (L) 6.5 - 8.1 g/dL   Albumin 2.6 (L) 3.5 - 5.0 g/dL   AST 29 15 - 41 U/L   ALT 23 0 - 44 U/L   Alkaline Phosphatase 63 38 - 126 U/L   Total Bilirubin 0.5 0.3 - 1.2 mg/dL   GFR calc non Af Amer >60 >60 mL/min   GFR calc Af Amer >60 >60 mL/min   Anion gap 6 5 - 15    Comment: Performed at [facility], [address] [place], [place] [address]  CBC     Status: Abnormal   Collection Time: 06/13/19  8:11 AM  Result Value Ref Range   WBC 10.0 4.0 - 10.5 K/uL   RBC 3.46 (L) 3.87 - 5.11 MIL/uL   Hemoglobin 11.7 (L) 12.0 - 15.0 g/dL   HCT 34.0 (L) 36.0 - 46.0 %   MCV 98.3 80.0 - 100.0 fL   MCH 33.8 26.0 - 34.0 pg   MCHC 34.4 30.0 - 36.0 g/dL   RDW 13.2 11.5 - 15.5 %    Platelets 206 150 - 400 K/uL   nRBC 0.3 (H) 0.0 - 0.2 %    Comment: Performed at [facility], [address] [place], [place] [address]   [place] Mfm Ob Follow Up  Result Date: 06/10/2019 ----------------------------------------------------------------------  OBSTETRICS REPORT                       (Signed Final 06/10/2019 05:04 pm) ---------------------------------------------------------------------- PATIENT INFO:  ID #:       [id number]                          D.O.B.:  September 19, 1986 (32 yrs)  Name:       Stephani Police DODSON               Visit Date: 06/10/2019 04:29 pm ---------------------------------------------------------------------- PERFORMED BY:  Performed By:     Rosalia Hammers          Referred By:      Lucia Estelle ---------------------------------------------------------------------- SERVICE(S) PROVIDED:   [place] MFM OB FOLLOW UP                                  [address]170.01  ---------------------------------------------------------------------- INDICATIONS:   [redacted] weeks gestation of pregnancy                Z3A.28  ---------------------------------------------------------------------- FETAL EVALUATION:  Num Of Fetuses:         1  Fetal Heart Rate(bpm):  160  Cardiac Activity:       Present  Presentation:           Cephalic  Placenta:               Posterior  AFI Sum(cm)     %Tile       Largest Pocket(cm)  9.3             5           2.86  RUQ(cm)       RLQ(cm)       LUQ(cm)        LLQ(cm)  1.95          2.57          1.92           2.86 ---------------------------------------------------------------------- BIOPHYSICAL EVALUATION:  Amniotic F.V:   Within normal limits       F. Tone:        Observed  F. Movement:    Observed                   Score:          8/8  F. Breathing:   Observed ---------------------------------------------------------------------- BIOMETRY:  BPD:      70.3  mm     G. Age:  28w 2d         46  %    CI:        77.06    %    70 - 86                                                          FL/HC:      19.1   %    18.8 - 20.6  HC:      253.6  mm     G. Age:  27w 4d         11  %    HC/AC:      1.22        1.05 - 1.21  AC:      208.7  mm     G. Age:  25w 3d        < 3  %    FL/BPD:     68.8   %    71 - 87  FL:       48.4  mm     G. Age:  26w 2d          4  %    FL/AC:      23.2   %    20 - 24  HUM:      44.2  mm     G. Age:  26w 2d          7  %  Est. FW:     888  gm    1 lb 15 oz     < 3  % ---------------------------------------------------------------------- GESTATIONAL AGE:  LMP:           28w 0d        Date:  11/26/18                 EDD:   09/02/19  U/S Today:     26w 6d  EDD:   09/10/19  Best:          Eden Emms28w 0d     Det. By:  LMP  (11/26/18)          EDD:   09/02/19 ---------------------------------------------------------------------- ANATOMY:  Cranium:               Not seen well due      Stomach:                Seen                         to position  Heart:                 4-Chamber view         Kidneys:                Normal appearance                         appears normal  RVOT:                  Normal appearance      Bladder:                Seen  LVOT:                  Normal appearance      Spine:                  Normal appearance  Diaphragm:             Within Normal Limits ---------------------------------------------------------------------- DOPPLER - FETAL VESSELS:  Umbilical Artery   S/D     %tile  6.71    > 97.5 ---------------------------------------------------------------------- CERVIX UTERUS ADNEXA:  Cervix  Length:            3.4  cm. ---------------------------------------------------------------------- IMPRESSION:  Thank you for referring your patient  for a fetal growth  evaluation.  Her history is significant for fetal growth  restriction and delivery at term.  There is a singleton gestation at 1728 weeks gestation.  The estimated fetal weight is at less than the third  percentile.  The follow up fetal anatomy is unremarkable.  The BPP is 8/8.  Intermittent absent end diastolic flow is seen.  The s/d ratio is  elevated at 6.7 when diasotlic flow is present.  Findings were reviewed.  Severe fetal growth restriction is present.  Recommend transfer to labor and delviery for fetal monitoring  and antenatal steroids.  If testing remains reassuring after 1-2  hours, NST can be repeated every 8 hours.  If testing is  concerning, [facility]  management due to gestaitonal age and efw.  Recommend follow up bpp/afi/dopplers here on thursday.  Repeat antenatl testing twice weekly (nst weekly and  nst/afi/dopplers weekly) and repeat growth in 3 weeks.  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  Thank you for allowing us to participate in your patient's care.  Please do not hesitate to contact us if we can be of further  assistance. ----------------------------------------------------------------------                   Consuelo PandyMaria Small, MD Electronically Signed Final Report   06/10/2019 05:04 pm ----------------------------------------------------------------------  Treatments: none  Discharge Exam: Blood pressure (!) 159/91, pulse (!) 56, temperature 98.6 F (37 C), temperature source Oral, resp. rate 16, height  (1.6 m), weight 66.7 kg, last menstrual period 11/26/2018. Blood pressures consistently in the mild range since yesterday afternoon. One transient severe range blood pressure that did not need treatment. Pulse this AM and overnight was in the 40s to 50s. EKG this AM was NSR with heart rate 61 General: in NAD, eating breakfast Heart: mild bradycardia with regular rhythm Lungs: CTAB Abdomen: uterus soft and NT FHR : 145 baseline with accelerations to 160s, moderate variability Extremities: no redness or tenderness. Mild pedal  edema  Disposition: Transfer to [facility]      Signed: Farrel Conners 06/13/2019, 1:42 PM

## 2019-06-13 NOTE — Progress Notes (Signed)
RN contacted [facility] for transport of patient to [facility]. RN also contacted Receiving RN, Asencion Partridge to give report on patient. Awaiting arrival from transport team.

## 2019-06-13 NOTE — Progress Notes (Signed)
Admission Date: 06/10/2019 Current Date: 06/13/2019   Cristina [name] is a 33 y.o. G2P1001 @ 108w3d here for continued observation due to severe IUGR and preeclampsia without severe features. EFW 1#15oz (<3rd%) on 06/10/2019 with intermittent absent end diastolic flow, 8/8 BPP Getting NSTs every 8 hours and they have been reactive. She received two doses of BMZ since admission History of heterozygosity for Factor V Leiden and hx of a stroke when on OCPs. Currently on Levonox and ASA daily.  S: Feels bloated due to constipation. No further headache since admission. No CP, SOB, nausea, visual changes or RUQ pain. Baby active  O: Vital signs:  Patient Vitals for the past 24 hrs:  BP Temp Temp src Pulse Resp  06/13/19 0821 (!) 154/86 - - (!) 44 -  06/13/19 0720 (!) 143/63 98.6 F (37 C) Oral (!) 44 16  06/13/19 0620 (!) 154/65 - - (!) 41 -  06/13/19 0521 (!) 148/74 - - (!) 44 -  06/13/19 0420 (!) 157/84 98.4 F (36.9 C) Oral (!) 42 -  06/13/19 0405 (!) 162/86 - - (!) 45 -  06/13/19 0120 (!) 149/81 - - (!) 51 -  06/13/19 0105 (!) 161/90 - - (!) 57 -  06/12/19 2059 (!) 149/80 - - 61 -  06/12/19 2044 (!) 157/83 - - 60 -  06/12/19 2029 (!) 158/87 - - (!) 59 -  06/12/19 2012 (!) 161/88 98.5 F (36.9 C) Oral 68 16  06/12/19 1557 (!) 148/75 98.4 F (36.9 C) Oral 62 18  06/12/19 1232 118/69 - - 66 -  06/12/19 1227 124/73 - - 63 -  06/12/19 1222 123/72 - - 60 -  06/12/19 1217 132/72 - - 63 -  06/12/19 1210 139/71 - - 60 -  06/12/19 1208 (!) 151/79 - - (!) 59 -  06/12/19 1206 (!) 140/91 97.7 F (36.5 C) Oral 69 18   Blood pressures consistently in the mild range since yesterday afternoon. One transient severe range blood pressure that did not need treatment. Pulse this AM and overnight was in the 40s to 50s.  General: in NAD, eating breakfast Heart: mild bradycardia with regular rhythm Lungs: CTAB Abdomen: uterus soft and NT FHR : 145 baseline with accelerations to 160s, moderate  variability Extremities: no redness or tenderness. Mild pedal edema  Results for orders placed or performed during the hospital encounter of 06/10/19 (from the past 24 hour(s))  Comprehensive metabolic panel     Status: Abnormal   Collection Time: 06/13/19  8:11 AM  Result Value Ref Range   Sodium 135 135 - 145 mmol/L   Potassium 4.2 3.5 - 5.1 mmol/L   Chloride 105 98 - 111 mmol/L   CO2 24 22 - 32 mmol/L   Glucose, Bld 82 70 - 99 mg/dL   BUN 13 6 - 20 mg/dL   Creatinine, Ser 0.78 0.44 - 1.00 mg/dL   Calcium 9.3 8.9 - 10.3 mg/dL   Total Protein 6.0 (L) 6.5 - 8.1 g/dL   Albumin 2.6 (L) 3.5 - 5.0 g/dL   AST 29 15 - 41 U/L   ALT 23 0 - 44 U/L   Alkaline Phosphatase 63 38 - 126 U/L   Total Bilirubin 0.5 0.3 - 1.2 mg/dL   GFR calc non Af Amer >60 >60 mL/min   GFR calc Af Amer >60 >60 mL/min   Anion gap 6 5 - 15  CBC     Status: Abnormal   Collection Time: 06/13/19  8:11 AM  Result Value Ref Range   WBC 10.0 4.0 - 10.5 K/uL   RBC 3.46 (L) 3.87 - 5.11 MIL/uL   Hemoglobin 11.7 (L) 12.0 - 15.0 g/dL   HCT 16.134.0 (L) 09.636.0 - 04.546.0 %   MCV 98.3 80.0 - 100.0 fL   MCH 33.8 26.0 - 34.0 pg   MCHC 34.4 30.0 - 36.0 g/dL   RDW 40.913.2 81.111.5 - 91.415.5 %   Platelets 206 150 - 400 K/uL   nRBC 0.3 (H) 0.0 - 0.2 %   A/P: IUP at [facility]28wk3d with severe IUGR and preeclampsia without severe features   Repeat PIH labs this AM show normal kidney and liver function  Continue NSTs every 8 hours. NSTs thus far have been reactive  [facility] perinatal appointment today at [facility] for Dopplers and POM Bradycardia: EKG done-NSR with rate 61 H/O stroke and heterozygous for Factor V Leiden- on Lovenox for DVT PPX Constipation: Colace  Farrel Connersolleen Cruze Zingaro, CNM

## 2019-06-14 ENCOUNTER — Telehealth: Payer: Self-pay

## 2019-06-14 MED ORDER — PNV PRENATAL PLUS MULTIVITAMIN 27-1 MG PO TABS
1.00 | ORAL_TABLET | ORAL | Status: DC
Start: 2019-06-15 — End: 2019-06-14

## 2019-06-14 MED ORDER — KETOROLAC TROMETHAMINE 15 MG/ML IJ SOLN
15.00 | INTRAMUSCULAR | Status: DC
Start: 2019-06-14 — End: 2019-06-14

## 2019-06-14 MED ORDER — DOCUSATE SODIUM 100 MG PO CAPS
100.00 | ORAL_CAPSULE | ORAL | Status: DC
Start: 2019-06-14 — End: 2019-06-14

## 2019-06-14 MED ORDER — GENERIC EXTERNAL MEDICATION
0.25 | Status: DC
Start: ? — End: 2019-06-14

## 2019-06-14 MED ORDER — ENOXAPARIN SODIUM 40 MG/0.4ML ~~LOC~~ SOLN
40.00 | SUBCUTANEOUS | Status: DC
Start: 2019-06-15 — End: 2019-06-14

## 2019-06-14 MED ORDER — Medication
5.00 | Status: DC
Start: ? — End: 2019-06-14

## 2019-06-14 MED ORDER — ONDANSETRON HCL 4 MG/2ML IJ SOLN
4.00 | INTRAMUSCULAR | Status: DC
Start: ? — End: 2019-06-14

## 2019-06-14 MED ORDER — SUPER B-50 B COMPLEX PO
0.40 | ORAL | Status: DC
Start: ? — End: 2019-06-14

## 2019-06-14 MED ORDER — VICON FORTE PO CAPS
17.00 | ORAL_CAPSULE | ORAL | Status: DC
Start: ? — End: 2019-06-14

## 2019-06-14 MED ORDER — OXYCODONE HCL 5 MG PO TABS
10.00 | ORAL_TABLET | ORAL | Status: DC
Start: ? — End: 2019-06-14

## 2019-06-14 MED ORDER — Medication
Status: DC
Start: ? — End: 2019-06-14

## 2019-06-14 MED ORDER — IRBESARTAN
0.50 | Status: DC
Start: ? — End: 2019-06-14

## 2019-06-14 MED ORDER — BARO-CAT PO
ORAL | Status: DC
Start: ? — End: 2019-06-14

## 2019-06-14 MED ORDER — MAGNESIUM SULFATE 40 GM/1000ML IV SOLN
2.00 | INTRAVENOUS | Status: DC
Start: ? — End: 2019-06-14

## 2019-06-14 MED ORDER — SIMETHICONE 80 MG PO CHEW
80.00 | CHEWABLE_TABLET | ORAL | Status: DC
Start: ? — End: 2019-06-14

## 2019-06-14 MED ORDER — OXYTOCIN-SODIUM CHLORIDE 20-0.9 UT/500ML-% IV SOLN
10.00 | INTRAVENOUS | Status: DC
Start: ? — End: 2019-06-14

## 2019-06-14 MED ORDER — PRO HERBS ENERGY PO TABS
20.00 | ORAL_TABLET | ORAL | Status: DC
Start: ? — End: 2019-06-14

## 2019-06-14 MED ORDER — Medication
15.00 | Status: DC
Start: ? — End: 2019-06-14

## 2019-06-14 MED ORDER — TUMS LASTING EFFECTS PO
15.00 | ORAL | Status: DC
Start: ? — End: 2019-06-14

## 2019-06-14 MED ORDER — DIPHENHYDRAMINE HCL 25 MG PO CAPS
25.00 | ORAL_CAPSULE | ORAL | Status: DC
Start: ? — End: 2019-06-14

## 2019-06-14 MED ORDER — METHYLERGONOVINE MALEATE 0.2 MG/ML IJ SOLN
0.20 | INTRAMUSCULAR | Status: DC
Start: ? — End: 2019-06-14

## 2019-06-14 MED ORDER — ACETAMINOPHEN 325 MG PO TABS
975.00 | ORAL_TABLET | ORAL | Status: DC
Start: 2019-06-14 — End: 2019-06-14

## 2019-06-14 MED ORDER — FUTURO SOFT CERVICAL COLLAR MISC
600.00 | Status: DC
Start: 2019-06-15 — End: 2019-06-14

## 2019-06-14 MED ORDER — MISOPROSTOL 200 MCG PO TABS
800.00 | ORAL_TABLET | ORAL | Status: DC
Start: ? — End: 2019-06-14

## 2019-06-14 MED ORDER — GLUCOSAMINE-CHONDROIT-COLLAGEN PO
100.00 | ORAL | Status: DC
Start: ? — End: 2019-06-14

## 2019-06-14 NOTE — Telephone Encounter (Signed)
Verbal to CLG.

## 2019-06-14 NOTE — Telephone Encounter (Signed)
Dr. Jonni Sanger from [facility] calling to give update on pt.  She delivered this am by classical c/s d/t pre severe preeclampsia; baby did well during delivery, apgars 7,9; surgery done 2hrs ago so they will keep pt a couple of days then turn her care back over to [place].  If questions, you may call his cell at [phone].

## 2019-07-04 NOTE — Telephone Encounter (Signed)
Please have her come to the office for a visit tomorrow. Thank you

## 2019-07-04 NOTE — Telephone Encounter (Signed)
Patient aware of schedule appointment 

## 2019-07-05 ENCOUNTER — Other Ambulatory Visit: Payer: Self-pay | Admitting: Obstetrics and Gynecology

## 2019-07-05 ENCOUNTER — Other Ambulatory Visit: Payer: Self-pay

## 2019-07-05 ENCOUNTER — Encounter: Payer: Self-pay | Admitting: Obstetrics and Gynecology

## 2019-07-05 ENCOUNTER — Ambulatory Visit (INDEPENDENT_AMBULATORY_CARE_PROVIDER_SITE_OTHER): Payer: Medicaid Other | Admitting: Obstetrics and Gynecology

## 2019-07-05 VITALS — BP 110/70 | Ht 63.0 in | Wt 135.0 lb

## 2019-07-05 DIAGNOSIS — N3001 Acute cystitis with hematuria: Secondary | ICD-10-CM

## 2019-07-05 DIAGNOSIS — R102 Pelvic and perineal pain: Secondary | ICD-10-CM | POA: Diagnosis not present

## 2019-07-05 LAB — POCT URINALYSIS DIPSTICK
Bilirubin, UA: NEGATIVE
Glucose, UA: NEGATIVE
Ketones, UA: NEGATIVE
Nitrite, UA: NEGATIVE
Protein, UA: POSITIVE — AB
Spec Grav, UA: 1.02 (ref 1.010–1.025)
Urobilinogen, UA: 0.2 E.U./dL
pH, UA: 5 (ref 5.0–8.0)

## 2019-07-05 MED ORDER — NITROFURANTOIN MONOHYD MACRO 100 MG PO CAPS: 100.0000 mg | ORAL_CAPSULE | Freq: Two times a day (BID) | ORAL | 1 refills | Status: DC

## 2019-07-05 NOTE — Progress Notes (Signed)
Patient ID: Cristina [name], female   DOB: 02-20-1986, 33 y.o.   MRN: [id number]  Reason for Consult: Abdominal Pain (Poss uti, hurt after and before urination, doesnt hurt around incision site)   Referred by Boykin Nearing, MD  Subjective:     HPI:  Cristina [name] is a 33 y.o. female . She recently had a cesarean delivery at [facility] She reports pain and pressure above her pubic bone and dysuria. Denies frequency. incsiion has been clean, dry and intact.  Past Medical History:  Diagnosis Date  . Anxiety   . Bicornate uterus complicating pregnancy 58/52/7782  . Connective tissue disease ([place])   . Depression   . Endocarditis of mitral valve    libman sachs?  . Heterozygous factor V Leiden mutation ([place]) 08/12/2014   07/17/14  . Intrauterine growth restriction of newborn   . Stroke [facility]) 06/2014   Family History  Problem Relation Age of Onset  . CAD Mother   . COPD Mother   . Stroke Paternal Grandfather   . Cancer Paternal Grandfather        lung cancer   . Alcohol abuse Father   . Stroke Maternal Uncle   . Stroke Other        uncle 44s, grandfather 2s  . Heart disease Maternal Grandfather   . Heart disease Paternal Grandmother    Past Surgical History:  Procedure Laterality Date  . right ear surgery    . TEE WITHOUT CARDIOVERSION N/A 07/18/2014   Procedure: TRANSESOPHAGEAL ECHOCARDIOGRAM (TEE);  Surgeon: Pixie Casino, MD;  Location: [facility] ENDOSCOPY;  Service: Cardiovascular;  Laterality: N/A;  . TEE WITHOUT CARDIOVERSION N/A 01/13/2015   Procedure: TRANSESOPHAGEAL ECHOCARDIOGRAM (TEE);  Surgeon: Candee Furbish, MD;  Location: [facility] ENDOSCOPY;  Service: Cardiovascular;  Laterality: N/A;    Short Social History:  Social History   Tobacco Use  . Smoking status: Former Smoker    Packs/day: 0.25    Years: 10.00    Pack years: 2.50    Types: Cigarettes    Quit date: 01/07/2019    Years since quitting: 0.4  . Smokeless tobacco: Never Used  . Tobacco comment: Quit once  found out she was pregnant  Substance Use Topics  . Alcohol use: Not Currently    Alcohol/week: 1.0 standard drinks    Types: 1 Shots of liquor per week    Comment: Occasionally.    No Known Allergies  Current Outpatient Medications  Medication Sig Dispense Refill  . enoxaparin (LOVENOX) 40 MG/0.4ML injection Inject 0.4 mLs (40 mg total) into the skin daily. 30 Syringe 11  . Prenatal Multivit-Min-Fe-FA (PRENATAL VITAMINS PO) Take by mouth.    Marland Kitchen aspirin 81 MG chewable tablet Chew 81 mg by mouth daily.    . nitrofurantoin, macrocrystal-monohydrate, (MACROBID) 100 MG capsule Take 1 capsule (100 mg total) by mouth 2 (two) times daily. 14 capsule 1   No current facility-administered medications for this visit.     REVIEW OF SYSTEMS      Objective:  Objective   Vitals:   07/05/19 1136  BP: 110/70  Weight: 135 lb (61.2 kg)  Height: 5\' 3"  (1.6 m)   Body mass index is 23.91 kg/m.  Physical Exam Vitals signs and nursing note reviewed.  Constitutional:      Appearance: She is well-developed.  HENT:     Head: Normocephalic and atraumatic.  Eyes:     Pupils: Pupils are equal, round, and reactive to light.  Cardiovascular:  Rate and Rhythm: Normal rate and regular rhythm.  Pulmonary:     Effort: Pulmonary effort is normal. No respiratory distress.  Abdominal:     General: Abdomen is flat. A surgical scar is present. There is no distension.     Comments: Incision is clean, dry and intact. No erythema.  Skin:    General: Skin is warm and dry.  Neurological:     Mental Status: She is alert and oriented to person, place, and time.  Psychiatric:        Behavior: Behavior normal.        Thought Content: Thought content normal.        Judgment: Judgment normal.         Assessment/Plan:     33 yo with dysuria UTI- sent culture and macrobid result  More than 10 minutes were spent face to face with the patient in the room with more than 50% of the time spent providing  counseling and discussing the plan of management.    Adelene Idlerhristanna Sandor Arboleda MD [facility], [facility] 07/05/2019 12:30 PM

## 2019-07-07 LAB — URINE CULTURE

## 2019-12-11 ENCOUNTER — Other Ambulatory Visit: Payer: Self-pay

## 2019-12-11 ENCOUNTER — Ambulatory Visit: Payer: Medicaid Other | Attending: Internal Medicine

## 2019-12-11 DIAGNOSIS — Z20822 Contact with and (suspected) exposure to covid-19: Secondary | ICD-10-CM

## 2019-12-13 LAB — NOVEL CORONAVIRUS, NAA: SARS-CoV-2, NAA: NOT DETECTED

## 2021-01-23 IMAGING — US US MFM OB FOLLOW UP
1 series · 12 of 28 positions shown · non-contrast
Comparison: none

PATIENT INFO:

PERFORMED BY:
                   Sonographer                              DON LOLITO
SERVICE(S) PROVIDED:
 ----------------------------------------------------------------------
INDICATIONS:
  28 weeks gestation of pregnancy
FETAL EVALUATION:
 Num Of Fetuses:         1
 Fetal Heart Rate(bpm):  160
 Cardiac Activity:       Present
 Presentation:           Cephalic
 Placenta:               Posterior
 AFI Sum(cm)     %Tile       Largest Pocket(cm)
 9.3             5
 RUQ(cm)       RLQ(cm)       LUQ(cm)        LLQ(cm)
BIOPHYSICAL EVALUATION:
 Amniotic F.V:   Within normal limits       F. Tone:        Observed
 F. Movement:    Observed                   Score:          [DATE]
 F. Breathing:   Observed
BIOMETRY:
 BPD:      70.3  mm     G. Age:  28w 2d         46  %    CI:        77.06   %    70 - 86
                                                         FL/HC:      19.1   %    18.8 -
 HC:      253.6  mm     G. Age:  27w 4d         11  %    HC/AC:      1.22        1.05 -
 AC:      208.7  mm     G. Age:  25w 3d        < 3  %    FL/BPD:     68.8   %    71 - 87
 FL:       48.4  mm     G. Age:  26w 2d          4  %    FL/AC:      23.2   %    20 - 24
 HUM:      44.2  mm     G. Age:  26w 2d          7  %
 Est. FW:     888  gm    1 lb 15 oz     < 3  %
GESTATIONAL AGE:
 LMP:           28w 0d        Date:  11/26/18                 EDD:   09/02/19
 U/S Today:     26w 6d                                        EDD:   09/10/19
 Best:          28w 0d     Det. By:  LMP  (11/26/18)          EDD:   09/02/19
ANATOMY:
 Cranium:               Not seen well due      Stomach:                Seen
                        to position
 Heart:                 4-Chamber view         Kidneys:                Normal appearance
                        appears normal
 RVOT:                  Normal appearance      Bladder:                Seen
 LVOT:                  Normal appearance      Spine:                  Normal appearance
 Diaphragm:             Within Normal Limits
DOPPLER - FETAL VESSELS:
 Umbilical Artery
  S/D     %tile
 6.71    >
CERVIX UTERUS ADNEXA:
 Cervix
 Length:            3.4  cm.

[Series 1: us mfm ob follow up · 0.22mm/px · 12 of 47 slices shown]
[im 2/47]
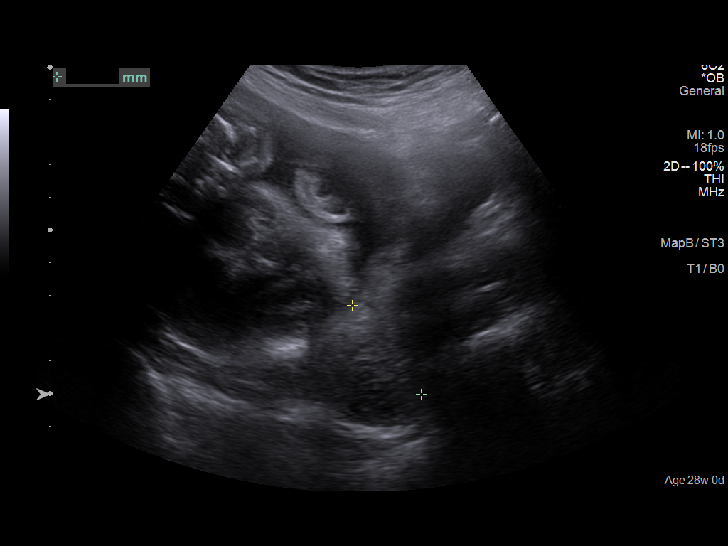
[im 6/47]
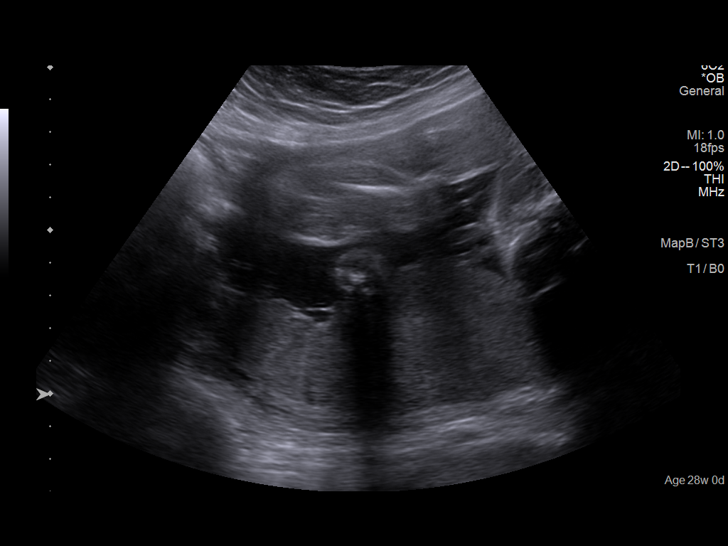
[im 9/47]
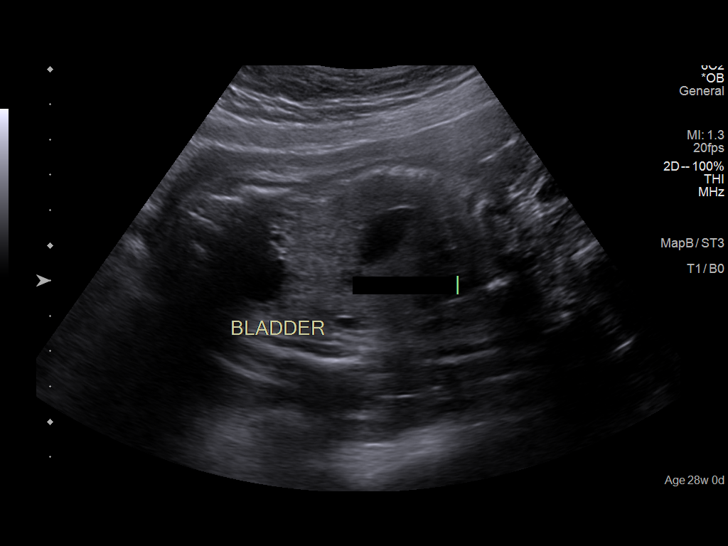
[im 14/47]
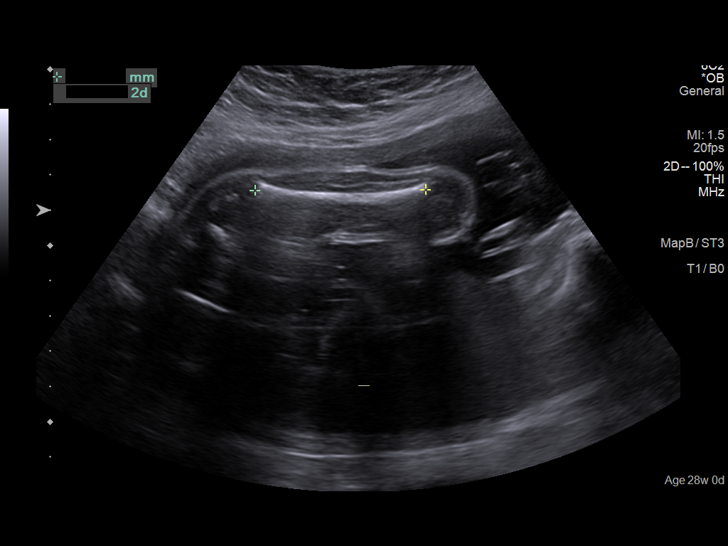
[im 18/47]
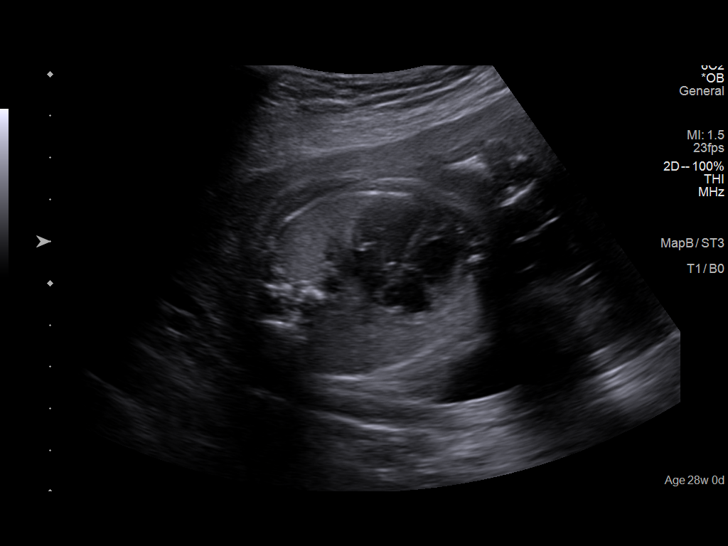
[im 21/47]
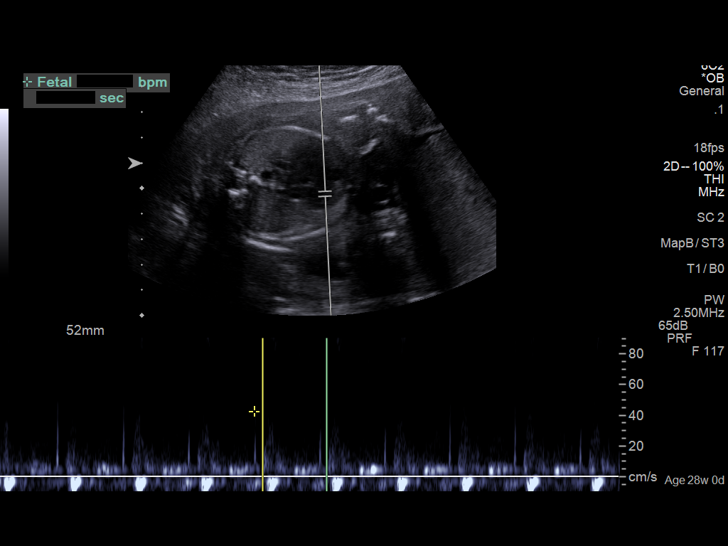
[im 26/47]
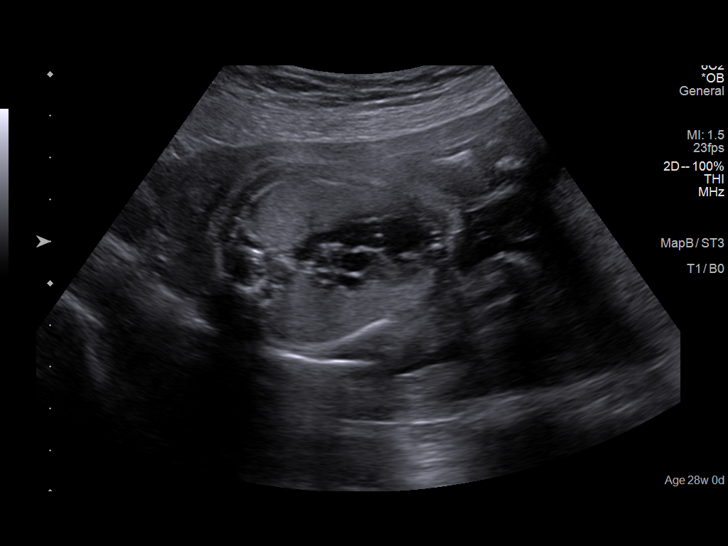
[im 29/47]
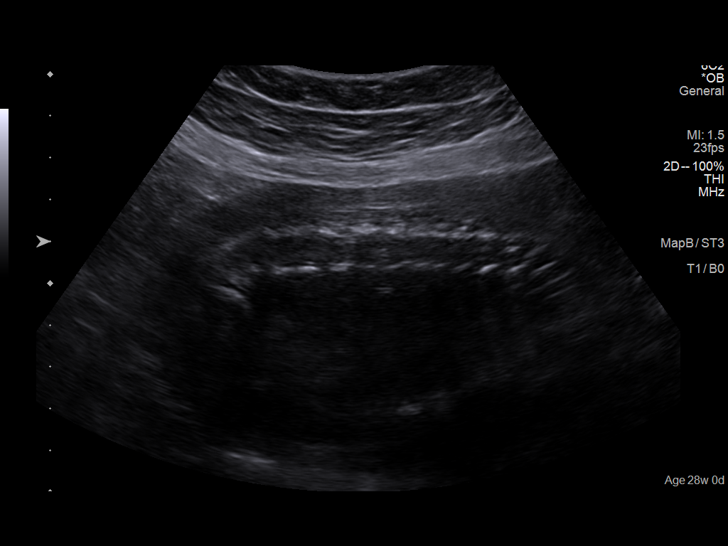
[im 33/47]
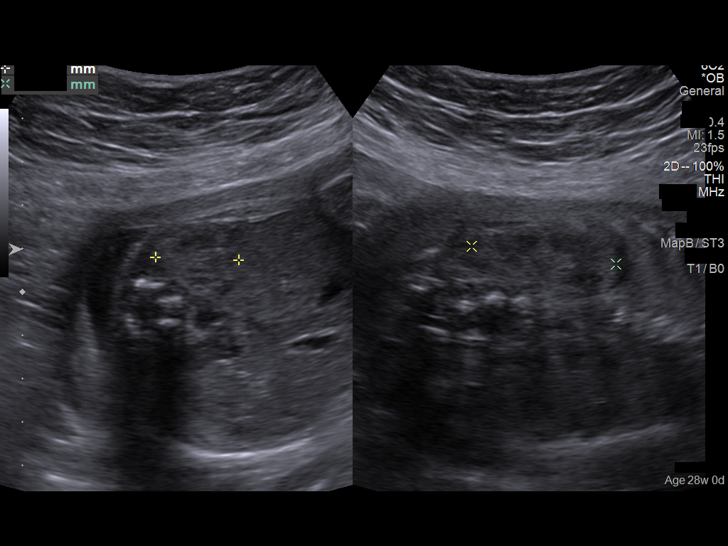
[im 38/47]
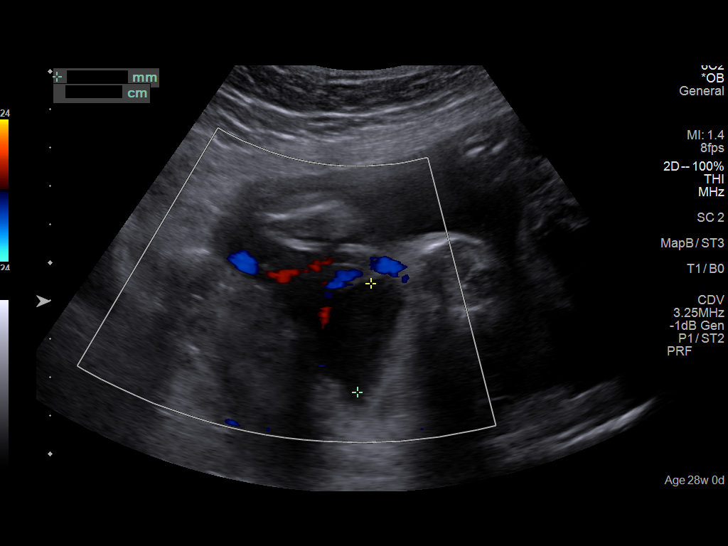
[im 41/47]
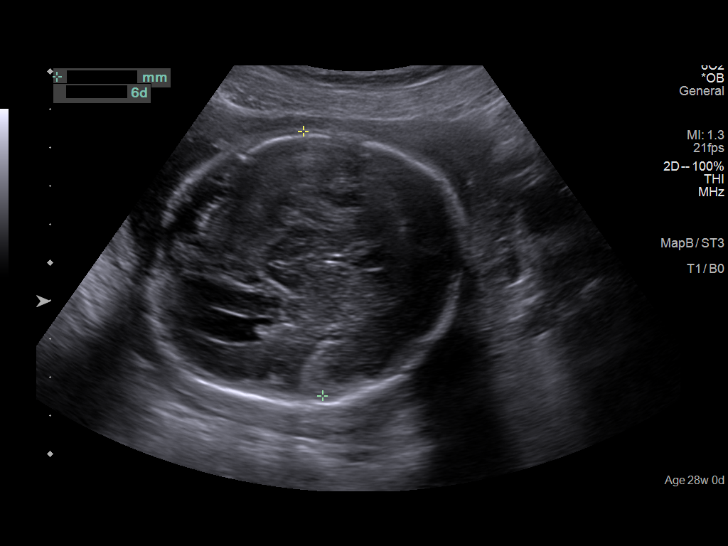
[im 45/47]
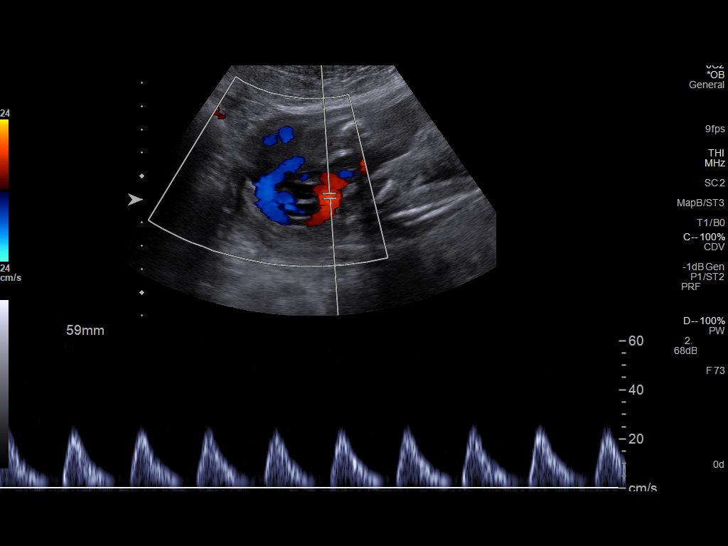

[12 of 28 positions shown; findings below may reference images not displayed]

IMPRESSION: Thank you for referring your patient  for a fetal growth
 evaluation.  Her history is significant for fetal growth
 restriction and delivery at term.

 There is a singleton gestation at 28 weeks gestation.

 The estimated fetal weight is at less than the third percentile.
 The follow up fetal anatomy is unremarkable.

 The BPP is [DATE].
 Intermittent absent end diastolic flow is seen.  The s/d ratio is
 elevated at 6.7 when diasotlic flow is present.

 Findings were reviewed.
 Severe fetal growth restriction is present.
 Recommend transfer to labor and delviery for fetal monitoring
 and antenatal steroids.  If testing remains reassuring after 1-2
 hours, NST can be repeated every 8 hours.  If testing is
 concerning, she can be transferred to [facility] for further
 management due to gestaitonal age and efw.
 Recommend follow up bpp/afi/dopplers here on [REDACTED].
 Repeat antenatl testing twice weekly (nst weekly and
 nst/afi/dopplers weekly) and repeat growth in 3 weeks.
 Delivery 34 weeks (sooner if testing worsens)
 She should decrease activity level (she stands for prolonged
 periods with her work at the country [employer]).

 Thank you for allowing us to participate in your patient's care.
 assistance.

                  Amazigh, Quirijn

## 2022-03-25 ENCOUNTER — Other Ambulatory Visit (HOSPITAL_COMMUNITY)
Admission: RE | Admit: 2022-03-25 | Discharge: 2022-03-25 | Disposition: A | Discharge status: Home / Self Care | Payer: 59 | Source: Ambulatory Visit | Attending: Advanced Practice Midwife | Admitting: Advanced Practice Midwife

## 2022-03-25 ENCOUNTER — Encounter: Payer: Self-pay | Admitting: Advanced Practice Midwife

## 2022-03-25 ENCOUNTER — Ambulatory Visit (INDEPENDENT_AMBULATORY_CARE_PROVIDER_SITE_OTHER): Payer: 59 | Admitting: Advanced Practice Midwife

## 2022-03-25 VITALS — BP 122/60 | Ht 63.0 in | Wt 131.0 lb

## 2022-03-25 DIAGNOSIS — Z124 Encounter for screening for malignant neoplasm of cervix: Secondary | ICD-10-CM | POA: Insufficient documentation

## 2022-03-25 DIAGNOSIS — Z01419 Encounter for gynecological examination (general) (routine) without abnormal findings: Secondary | ICD-10-CM | POA: Insufficient documentation

## 2022-03-25 NOTE — Progress Notes (Signed)
? ? ? ?Gynecology Annual Exam   ?PCP: Dessa PhiFunches, Josalyn, MD ? ?Chief Complaint:  ?Chief Complaint  ?Patient presents with  ? Gynecologic Exam  ? ? ?History of Present Illness: Patient is a 36 y.o. [id number] presents for annual exam. The patient mentions today that she is done having pregnancies and due to past traumatic pregnancy/birth experience she is most interested in a hysterectomy. She does have a history of stroke following use of hormonal BC and she has Factor V Leiden. Her periods tend to be painful and are otherwise tolerable. We also discussed possible uterine ablation and recommended she follow up with MD for further discussion/planning.   ? ?LMP: Patient's last menstrual period was 02/09/2022 (approximate). ?Average Interval: regular, every 4-5 weeks ?Duration of flow: 5 days ?Heavy Menses: sometimes ?Clots: no ?Intermenstrual Bleeding: no ?Postcoital Bleeding: no ?Dysmenorrhea: yes ? ?The patient is sexually active. She currently uses  natural family planning  for contraception. She denies dyspareunia.  The patient does perform self breast exams.  There is no notable family history of breast or ovarian cancer in her family. ? ?The patient wears seatbelts: yes.   The patient has regular exercise:  she is active with her children, she admits healthy lifestyle diet and admits she could increase her h2o intake, she usually has 6 hours of sleep .   ? ?The patient denies current symptoms of depression. She is able to cope well with life stressors. ? ?Review of Systems: Review of Systems  ?Constitutional:  Negative for chills and fever.  ?HENT:  Negative for congestion, ear discharge, ear pain, hearing loss, sinus pain and sore throat.   ?Eyes:  Negative for blurred vision and double vision.  ?Respiratory:  Negative for cough, shortness of breath and wheezing.   ?Cardiovascular:  Negative for chest pain, palpitations and leg swelling.  ?Gastrointestinal:  Negative for abdominal pain, blood in stool,  constipation, diarrhea, heartburn, melena, nausea and vomiting.  ?Genitourinary:  Negative for dysuria, flank pain, frequency, hematuria and urgency.  ?Musculoskeletal:  Negative for back pain, joint pain and myalgias.  ?Skin:  Negative for itching and rash.  ?Neurological:  Negative for dizziness, tingling, tremors, sensory change, speech change, focal weakness, seizures, loss of consciousness, weakness and headaches.  ?Endo/Heme/Allergies:  Negative for environmental allergies. Does not bruise/bleed easily.  ?Psychiatric/Behavioral:  Negative for depression, hallucinations, memory loss, substance abuse and suicidal ideas. The patient is not nervous/anxious and does not have insomnia.   ? ?Past Medical History:  ?Patient Active Problem List  ? Diagnosis Date Noted  ? History of prior pregnancy with SGA newborn 02/08/2019  ? 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 Positive ANA (antinuclear antibody) 07/06/2016  ? 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 Anxiety disorder 02/22/2016  ? 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 Cerebral infarction due to embolism of middle cerebral artery (HCC) 01/06/2015  ?  The patient was counseled that pregnancy increases the risk of recurrent stroke 3-4 fold.  The absolute risk of recurrent stroke in pregnancy is ~ 1%.   ?The only antiplatelet agent that is considered safe in pregnancy is low-dose aspirin (81 mg per day).  She was advised to start this in addition to her enoxaparin. ?An important contributing factor in the increased risk of stroke in pregnancy is hypercoagulability. ?The most comprehensive guidelines addressing prevention of recurrent stroke in pregnancy are the Canadian Heart Association guidelines. They recommend a combination of prophylactic low-molecular-weight heparin (eg enoxaparin 40  mg qd) and low-dose aspirin during pregnancy and for 6 weeks postpartum. Hermelinda Medicus et al, Intl J of Stroke,  2018) ?Enoxaparin should be held 12-24 hours prior to scheduled delivery. ?Enoxaparin should be restarted 12 hours after a vaginal delivery or 24 hours after a cesarean delivery and continued for 6 weeks. ?At the time of active labor and until her enoxaparin is restarted, I would recommend pneumatic compression devices. ?Postpartum, she should avoid estrogen containing contraceptives (and Depo-Provera) in the future.  Other progestin-only contraceptives have not been shown to increase the risk of arterial or venous thromboembolism (Tepper et al, Contraception, 2016) ?  ? Heterozygous factor V Leiden mutation (HCC) 08/12/2014  ?  07/17/14 ?  ? Migraine 07/17/2014  ? ? ?Past Surgical History:  ?Past Surgical History:  ?Procedure Laterality Date  ? right ear surgery    ? TEE WITHOUT CARDIOVERSION N/A 07/18/2014  ? Procedure: TRANSESOPHAGEAL ECHOCARDIOGRAM (TEE);  Surgeon: Chrystie Nose, MD;  Location: [facility] ENDOSCOPY;  Service: Cardiovascular;  Laterality: N/A;  ? TEE WITHOUT CARDIOVERSION N/A 01/13/2015  ? Procedure: TRANSESOPHAGEAL ECHOCARDIOGRAM (TEE);  Surgeon: Donato Schultz, MD;  Location: [facility] ENDOSCOPY;  Service: Cardiovascular;  Laterality: N/A;  ? ? ?Gynecologic History:  ?Patient's last menstrual period was 02/09/2022 (approximate). ?Contraception: none, limited focus on natural family planning ?Last Pap: Results were: no abnormalities  ? ?Obstetric History: [id number] ? ?Family History:  ?Family History  ?Problem Relation Age of Onset  ? CAD Mother   ? COPD Mother   ? Stroke Paternal Grandfather   ? Cancer Paternal Grandfather   ?     lung cancer   ? Alcohol abuse Father   ? Stroke Maternal Uncle   ? Stroke Other   ?     uncle 4s, grandfather 27s  ? Heart disease Maternal Grandfather   ? Heart disease Paternal Grandmother   ? ? ?Social History:  ?Social History  ? ?Socioeconomic History  ? Marital status: Married  ?  Spouse name: Arlys John  ? Number of children: 1  ? Years of education: 44  ? Highest education level: Not  on file  ?Occupational History  ? Not on file  ?Tobacco Use  ? Smoking status: Former  ?  Packs/day: 0.25  ?  Years: 10.00  ?  Pack years: 2.50  ?  Types: Cigarettes  ?  Quit date: 01/07/2019  ?  Years since quitting: 3.2  ? Smokeless tobacco: Never  ? Tobacco comments:  ?  Quit once found out she was pregnant  ?Vaping Use  ? Vaping Use: Never used  ?Substance and Sexual Activity  ? Alcohol use: Not Currently  ?  Alcohol/week: 1.0 standard drink  ?  Types: 1 Shots of liquor per week  ?  Comment: Occasionally.  ? Drug use: No  ? Sexual activity: Not Currently  ?  Comment: Depo  ?Other Topics Concern  ? Not on file  ?Social History Narrative  ? Patient is single with one child.  ? Patient is right handed.  ? Patient has 12 th grade education.  ? Patient drinks 2 caffeine drinks a day  ? ?Social Determinants of Health  ? ?Financial Resource Strain: Not on file  ?Food Insecurity: Not on file  ?Transportation Needs: Not on file  ?Physical Activity: Not on file  ?Stress: Not on file  ?Social Connections: Not on file  ?Intimate Partner Violence: Not on file  ? ? ?Allergies:  ?No Known Allergies ? ?Medications: ?Prior to Admission medications   ?Not  on File  ? ? ?Physical Exam ?Vitals: Blood pressure 122/60, height 5\' 3"  (1.6 m), weight 131 lb (59.4 kg), last menstrual period 02/09/2022. ? ?General: NAD ?HEENT: normocephalic, anicteric ?Thyroid: no enlargement, no palpable nodules ?Pulmonary: No increased work of breathing, CTAB ?Cardiovascular: RRR, distal pulses 2+ ?Breast: Breast symmetrical, no tenderness, no palpable nodules or masses, no skin or nipple retraction present, no nipple discharge.  No axillary or supraclavicular lymphadenopathy. ?Abdomen: NABS, soft, non-tender, non-distended.  Umbilicus without lesions.  No hepatomegaly, splenomegaly or masses palpable. No evidence of hernia  ?Genitourinary: ? External: Normal external female genitalia.  Normal urethral meatus, normal Bartholin's and Skene's glands.    ? Vagina: Normal vaginal mucosa, no evidence of prolapse.   ? Cervix: Grossly normal in appearance, no bleeding ?Extremities: no edema, erythema, or tenderness ?Neurologic: Grossly intact ?Psychiatric: mood appropriate, affect

## 2022-03-28 LAB — CYTOLOGY - PAP
Comment: NEGATIVE
Diagnosis: NEGATIVE
High risk HPV: NEGATIVE

## 2022-04-06 ENCOUNTER — Encounter: Payer: Self-pay | Admitting: Obstetrics and Gynecology

## 2022-04-06 ENCOUNTER — Ambulatory Visit (INDEPENDENT_AMBULATORY_CARE_PROVIDER_SITE_OTHER): Payer: 59 | Admitting: Obstetrics and Gynecology

## 2022-04-06 VITALS — BP 122/72 | Ht 62.0 in | Wt 132.0 lb

## 2022-04-06 DIAGNOSIS — Z3009 Encounter for other general counseling and advice on contraception: Secondary | ICD-10-CM | POA: Diagnosis not present

## 2022-04-06 NOTE — Progress Notes (Signed)
36 yo P2 with LMP 03/04/22 and BMI 24 who is here to discuss sterilization procedure. Patient reports a monthly period. She denies pelvic pain or abnormal discharge. Patient has tried different typed of hormonal contraception and is not interested in taking them. Patient is not a candidate for IUD due to a bicornuate uterus. Patient is certain that she does not want any more children ? ?Past Medical History:  ?Diagnosis Date  ? Anxiety   ? Bicornate uterus complicating pregnancy [id number]  ? Connective tissue disease ([place])   ? Depression   ? Endocarditis of mitral valve   ? libman sachs?  ? Heterozygous factor V Leiden mutation ([place]) 08/12/2014  ? 07/17/14  ? Intrauterine growth restriction of newborn   ? Stroke [facility]) 06/2014  ? ?Past Surgical History:  ?Procedure Laterality Date  ? right ear surgery    ? TEE WITHOUT CARDIOVERSION N/A 07/18/2014  ? Procedure: TRANSESOPHAGEAL ECHOCARDIOGRAM (TEE);  Surgeon: Pixie Casino, MD;  Location: [facility] ENDOSCOPY;  Service: Cardiovascular;  Laterality: N/A;  ? TEE WITHOUT CARDIOVERSION N/A 01/13/2015  ? Procedure: TRANSESOPHAGEAL ECHOCARDIOGRAM (TEE);  Surgeon: Candee Furbish, MD;  Location: [facility] ENDOSCOPY;  Service: Cardiovascular;  Laterality: N/A;  ? ?Family History  ?Problem Relation Age of Onset  ? CAD Mother   ? COPD Mother   ? Stroke Paternal Grandfather   ? Cancer Paternal Grandfather   ?     lung cancer   ? Alcohol abuse Father   ? Stroke Maternal Uncle   ? Stroke Other   ?     uncle 16s, grandfather 38s  ? Heart disease Maternal Grandfather   ? Heart disease Paternal Grandmother   ? ?Social History  ? ?Tobacco Use  ? Smoking status: Former  ?  Packs/day: 0.25  ?  Years: 10.00  ?  Pack years: 2.50  ?  Types: Cigarettes  ?  Quit date: 01/07/2019  ?  Years since quitting: 3.2  ? Smokeless tobacco: Never  ? Tobacco comments:  ?  Quit once found out she was pregnant  ?Vaping Use  ? Vaping Use: Never used  ?Substance Use Topics  ? Alcohol use: Not Currently  ?  Alcohol/week: 1.0  standard drink  ?  Types: 1 Shots of liquor per week  ?  Comment: Occasionally.  ? Drug use: No  ? ?ROS ?See pertinent in HPI. All other systems reviewed and non contributory ?Blood pressure 122/72, height 5\' 2"  (1.575 m), weight 132 lb (59.9 kg), last menstrual period 03/04/2022. ?GENERAL: Well-developed, well-nourished female in no acute distress.  ?NEURO: alert and oriented x 3 ? ?A/P 36 y.o. [id number]  with undesired fertility ?- Other reversible forms of contraception were discussed with patient; she declines all other modalities.   ?- Risks of procedure discussed with patient including permanence of method, bleeding, infection, injury to surrounding organs and need for additional procedures including laparotomy, risk of regret.  Failure risk of 0.5-1% with increased risk of ectopic gestation if pregnancy occurs was also discussed with patient.   ?- Patient has medicaid as Consulting civil engineer and completed BTL form today ?- Patient will be scheduled for laparoscopic salpingectomy ? ? ?

## 2022-04-08 ENCOUNTER — Telehealth: Payer: Self-pay | Admitting: Obstetrics and Gynecology

## 2022-04-08 NOTE — Telephone Encounter (Signed)
-----   Message from Mora Bellman, MD sent at 04/06/2022  9:52 AM EDT ----- ?Surgery Booking Request ?Patient Full Name:  Cristina [name]  ?MRN: [id number]  ?DOB: 1986-01-18  ?Surgeon: Mora Bellman, MD  ?Requested Surgery Date and Time: 5/25 ?Primary Diagnosis AND Code: Encounter for female sterilization ?Secondary Diagnosis and Code:  ?Surgical Procedure: Laparoscopy with bilateral Salpingectomy ?RNFA Requested?: Yes ?L&D Notification: No ?Admission Status: same day surgery ?Length of Surgery: 50 min ?Special Case Needs: No ?H&P:  ?Phone Interview???:   ?Interpreter:  ?Medical Clearance:  No ?Special Scheduling Instructions:  ?Any known health/anesthesia issues, diabetes, sleep apnea, latex allergy, defibrillator/pacemaker?: No ?Acuity: P3 ?  (P1 highest, P2 delay may cause harm, P3 low, elective gyn, P4 lowest) ?Post op follow up visits: 2 weeks post op ? ? ?

## 2022-04-08 NOTE — Telephone Encounter (Signed)
Called patient to schedule laparoscopy with bilateral salpingectomy with [facility] ? ?DOS 05/19/22 ? ?Pre-admit phone call appointment to be requested. All appointments will be updated on pt MyChart. Explained that this appointment has a call window. Based on the time scheduled will indicate if the call will be received within a 4 hour window before 1:00 or after. ? ?Advised that pt may also receive calls from the hospital pharmacy and pre-service center. ? ?Confirmed pt has [employer] as Chartered certified accountant. ?[employer] as Consulting civil engineer.  ? ?BTL consent was signed 04/06/22 ? ?Post Op appt 06/01/22 @ 10:55 ?

## 2022-05-09 ENCOUNTER — Other Ambulatory Visit
Admission: RE | Admit: 2022-05-09 | Discharge: 2022-05-09 | Disposition: A | Discharge status: Home / Self Care | Payer: 59 | Source: Ambulatory Visit | Attending: Obstetrics and Gynecology | Admitting: Obstetrics and Gynecology

## 2022-05-09 ENCOUNTER — Other Ambulatory Visit: Payer: Self-pay

## 2022-05-09 DIAGNOSIS — O99019 Anemia complicating pregnancy, unspecified trimester: Secondary | ICD-10-CM

## 2022-05-09 DIAGNOSIS — Z01812 Encounter for preprocedural laboratory examination: Secondary | ICD-10-CM

## 2022-05-09 DIAGNOSIS — I63311 Cerebral infarction due to thrombosis of right middle cerebral artery: Secondary | ICD-10-CM

## 2022-05-09 HISTORY — DX: Nausea with vomiting, unspecified: R11.2

## 2022-05-09 HISTORY — DX: Headache, unspecified: R51.9

## 2022-05-09 HISTORY — DX: Other specified postprocedural states: Z98.890

## 2022-05-09 HISTORY — DX: Anemia, unspecified: D64.9

## 2022-05-09 NOTE — Patient Instructions (Addendum)
Your procedure is scheduled on: 05/19/22 - Thursday ?Report to the Registration Desk on the 1st floor of the [facility]. ?To find out your arrival time, please call [phone] between 1PM - 3PM on: 05/18/22 - Wednesday ?If your arrival time is 6:00 am, do not arrive prior to that time as the [facility] entrance doors do not open until 6:00 am. ? ?REMEMBER: ?Instructions that are not followed completely may result in serious medical risk, up to and including death; or upon the discretion of your surgeon and anesthesiologist your surgery may need to be rescheduled. ? ?Do not eat food after midnight the night before surgery.  ?No gum chewing, lozengers or hard candies. ? ?You may however, drink CLEAR liquids up to 2 hours before you are scheduled to arrive for your surgery. Do not drink anything within 2 hours of your scheduled arrival time. ? ?Clear liquids include: ?- water  ?- apple juice without pulp ?- gatorade (not RED colors) ?- black coffee or tea (Do NOT add milk or creamers to the coffee or tea) ?Do NOT drink anything that is not on this list. ? ?TAKE THESE MEDICATIONS THE MORNING OF SURGERY WITH A SIP OF WATER: NONE ? ?One week prior to surgery: ?Stop Anti-inflammatories (NSAIDS) such as Advil, Aleve, Ibuprofen, Motrin, Naproxen, Naprosyn and Aspirin based products such as Excedrin, Goodys Powder, BC Powder. ? ?Stop ANY OVER THE COUNTER supplements until after surgery. ? ?You may however, continue to take Tylenol if needed for pain up until the day of surgery. ? ?No Alcohol for 24 hours before or after surgery. ? ?No Smoking including e-cigarettes for 24 hours prior to surgery.  ?No chewable tobacco products for at least 6 hours prior to surgery.  ?No nicotine patches on the day of surgery. ? ?Do not use any "recreational" drugs for at least a week prior to your surgery.  ?Please be advised that the combination of cocaine and anesthesia may have negative outcomes, up to and including death. ?If you  test positive for cocaine, your surgery will be cancelled. ? ?On the morning of surgery brush your teeth with toothpaste and water, you may rinse your mouth with mouthwash if you wish. ?Do not swallow any toothpaste or mouthwash. ? ?Use CHG Soap or wipes as directed on instruction sheet. ? ?Do not wear jewelry, make-up, hairpins, clips or nail polish. ? ?Do not wear lotions, powders, or perfumes.  ? ?Do not shave body from the neck down 48 hours prior to surgery just in case you cut yourself which could leave a site for infection.  ?Also, freshly shaved skin may become irritated if using the CHG soap. ? ?Contact lenses, hearing aids and dentures may not be worn into surgery. ? ?Do not bring valuables to the hospital. [facility] is not responsible for any missing/lost belongings or valuables.  ? ?Notify your doctor if there is any change in your medical condition (cold, fever, infection). ? ?Wear comfortable clothing (specific to your surgery type) to the hospital. ? ?After surgery, you can help prevent lung complications by doing breathing exercises.  ?Take deep breaths and cough every 1-2 hours. Your doctor may order a device called an Incentive Spirometer to help you take deep breaths. ?When coughing or sneezing, hold a pillow firmly against your incision with both hands. This is called ?splinting.? Doing this helps protect your incision. It also decreases belly discomfort. ? ?If you are being admitted to the hospital overnight, leave your suitcase in the car. ?  After surgery it may be brought to your room. ? ?If you are being discharged the day of surgery, you will not be allowed to drive home. ?You will need a responsible adult (18 years or older) to drive you home and stay with you that night.  ? ?If you are taking public transportation, you will need to have a responsible adult (18 years or older) with you. ?Please confirm with your physician that it is acceptable to use public transportation.  ? ?Please call  the Pre-admissions Testing Dept. at [phone] if you have any questions about these instructions. ? ?Surgery Visitation Policy: ? ?Patients undergoing a surgery or procedure may have two family members or support persons with them as long as the person is not COVID-19 positive or experiencing its symptoms.  ? ?Inpatient Visitation:   ? ?Visiting hours are 7 a.m. to 8 p.m. ?Up to four visitors are allowed at one time in a patient room, including children. The visitors may rotate out with other people during the day. One designated support person (adult) may remain overnight.  ?

## 2022-05-11 ENCOUNTER — Other Ambulatory Visit
Admission: RE | Admit: 2022-05-11 | Discharge: 2022-05-11 | Disposition: A | Discharge status: Home / Self Care | Payer: 59 | Source: Ambulatory Visit | Attending: Obstetrics and Gynecology | Admitting: Obstetrics and Gynecology

## 2022-05-11 ENCOUNTER — Encounter: Payer: Self-pay | Admitting: Urgent Care

## 2022-05-11 DIAGNOSIS — O99019 Anemia complicating pregnancy, unspecified trimester: Secondary | ICD-10-CM | POA: Diagnosis not present

## 2022-05-11 DIAGNOSIS — Z01818 Encounter for other preprocedural examination: Secondary | ICD-10-CM | POA: Insufficient documentation

## 2022-05-11 DIAGNOSIS — Z01810 Encounter for preprocedural cardiovascular examination: Secondary | ICD-10-CM | POA: Diagnosis not present

## 2022-05-11 DIAGNOSIS — Z01812 Encounter for preprocedural laboratory examination: Secondary | ICD-10-CM

## 2022-05-11 DIAGNOSIS — I63311 Cerebral infarction due to thrombosis of right middle cerebral artery: Secondary | ICD-10-CM | POA: Diagnosis not present

## 2022-05-11 LAB — BASIC METABOLIC PANEL
Anion gap: 4 — ABNORMAL LOW (ref 5–15)
BUN: 15 mg/dL (ref 6–20)
CO2: 26 mmol/L (ref 22–32)
Calcium: 9 mg/dL (ref 8.9–10.3)
Chloride: 108 mmol/L (ref 98–111)
Creatinine, Ser: 0.85 mg/dL (ref 0.44–1.00)
GFR, Estimated: >60 mL/min (ref >60)
Glucose, Bld: 88 mg/dL (ref 70–99)
Potassium: 3.5 mmol/L (ref 3.5–5.1)
Sodium: 138 mmol/L (ref 135–145)

## 2022-05-11 LAB — CBC
HCT: 34.5 % — ABNORMAL LOW (ref 36.0–46.0)
Hemoglobin: 11.4 g/dL — ABNORMAL LOW (ref 12.0–15.0)
MCH: 30 pg (ref 26.0–34.0)
MCHC: 33 g/dL (ref 30.0–36.0)
MCV: 90.8 fL (ref 80.0–100.0)
Platelets: 224 10*3/uL (ref 150–400)
RBC: 3.8 MIL/uL — ABNORMAL LOW (ref 3.87–5.11)
RDW: 13.6 % (ref 11.5–15.5)
WBC: 2.8 10*3/uL — ABNORMAL LOW (ref 4.0–10.5)
nRBC: 0 % (ref 0.0–0.2)

## 2022-05-11 LAB — TYPE AND SCREEN
ABO/RH(D): O POS
Antibody Screen: NEGATIVE

## 2022-05-18 MED ORDER — POVIDONE-IODINE 10 % EX SWAB: 2.0000 "application " | Freq: Once | CUTANEOUS | Status: DC

## 2022-05-18 MED ORDER — LACTATED RINGERS IV SOLN: INTRAVENOUS | Status: DC

## 2022-05-18 MED ORDER — CHLORHEXIDINE GLUCONATE 0.12 % MT SOLN
15.0000 mL | Freq: Once | OROMUCOSAL | Status: AC
  Administered 2022-05-19: 15 mL via OROMUCOSAL

## 2022-05-18 MED ORDER — FAMOTIDINE 20 MG PO TABS
20.0000 mg | ORAL_TABLET | Freq: Once | ORAL | Status: AC
  Administered 2022-05-19: 20 mg via ORAL

## 2022-05-18 MED ORDER — ORAL CARE MOUTH RINSE: 15.0000 mL | Freq: Once | OROMUCOSAL | Status: AC

## 2022-05-19 ENCOUNTER — Ambulatory Visit: Payer: 59 | Admitting: Anesthesiology

## 2022-05-19 ENCOUNTER — Other Ambulatory Visit: Payer: Self-pay

## 2022-05-19 ENCOUNTER — Encounter: Payer: Self-pay | Admitting: Obstetrics and Gynecology

## 2022-05-19 ENCOUNTER — Ambulatory Visit
Admission: RE | Admit: 2022-05-19 | Discharge: 2022-05-19 | Disposition: A | Discharge status: Home / Self Care | Payer: 59 | Attending: Obstetrics and Gynecology | Admitting: Obstetrics and Gynecology

## 2022-05-19 ENCOUNTER — Encounter
Admission: RE | Disposition: A | Discharge status: Home / Self Care | Payer: Self-pay | Source: Home / Self Care | Attending: Obstetrics and Gynecology

## 2022-05-19 DIAGNOSIS — Z87891 Personal history of nicotine dependence: Secondary | ICD-10-CM | POA: Insufficient documentation

## 2022-05-19 DIAGNOSIS — Z302 Encounter for sterilization: Secondary | ICD-10-CM | POA: Diagnosis present

## 2022-05-19 DIAGNOSIS — I69398 Other sequelae of cerebral infarction: Secondary | ICD-10-CM | POA: Insufficient documentation

## 2022-05-19 DIAGNOSIS — F32A Depression, unspecified: Secondary | ICD-10-CM | POA: Insufficient documentation

## 2022-05-19 DIAGNOSIS — F419 Anxiety disorder, unspecified: Secondary | ICD-10-CM | POA: Insufficient documentation

## 2022-05-19 DIAGNOSIS — Z01812 Encounter for preprocedural laboratory examination: Secondary | ICD-10-CM

## 2022-05-19 HISTORY — PX: LAPAROSCOPIC BILATERAL SALPINGECTOMY: SHX5889

## 2022-05-19 LAB — ABO/RH: ABO/RH(D): O POS

## 2022-05-19 LAB — POCT PREGNANCY, URINE: Preg Test, Ur: NEGATIVE

## 2022-05-19 SURGERY — SALPINGECTOMY, BILATERAL, LAPAROSCOPIC
Anesthesia: General | Site: Pelvis | Laterality: Bilateral

## 2022-05-19 MED ORDER — LACTATED RINGERS IV SOLN: INTRAVENOUS | Status: DC

## 2022-05-19 MED ORDER — OXYCODONE-ACETAMINOPHEN 5-325 MG PO TABS: 1.0000 | ORAL_TABLET | Freq: Four times a day (QID) | ORAL | 0 refills | Status: AC | PRN

## 2022-05-19 MED ORDER — IBUPROFEN 600 MG PO TABS: 600.0000 mg | ORAL_TABLET | Freq: Four times a day (QID) | ORAL | 3 refills | Status: AC | PRN

## 2022-05-19 MED ORDER — FENTANYL CITRATE (PF) 100 MCG/2ML IJ SOLN
INTRAMUSCULAR | Status: AC
Start: 2022-05-19 — End: ?
  Filled 2022-05-19: qty 2

## 2022-05-19 MED ORDER — CHLORHEXIDINE GLUCONATE 0.12 % MT SOLN
OROMUCOSAL | Status: AC
  Filled 2022-05-19: qty 15

## 2022-05-19 MED ORDER — SUGAMMADEX SODIUM 200 MG/2ML IV SOLN
INTRAVENOUS | Status: DC | PRN
  Administered 2022-05-19: 200 mg via INTRAVENOUS

## 2022-05-19 MED ORDER — OXYCODONE HCL 5 MG/5ML PO SOLN: 5.0000 mg | Freq: Once | ORAL | Status: DC | PRN

## 2022-05-19 MED ORDER — SCOPOLAMINE 1 MG/3DAYS TD PT72: 1.0000 | MEDICATED_PATCH | TRANSDERMAL | Status: DC

## 2022-05-19 MED ORDER — PROPOFOL 10 MG/ML IV BOLUS
INTRAVENOUS | Status: AC
  Filled 2022-05-19: qty 20

## 2022-05-19 MED ORDER — ACETAMINOPHEN 10 MG/ML IV SOLN
INTRAVENOUS | Status: DC | PRN
  Administered 2022-05-19: 1000 mg via INTRAVENOUS

## 2022-05-19 MED ORDER — ACETAMINOPHEN 10 MG/ML IV SOLN: 1000.0000 mg | Freq: Once | INTRAVENOUS | Status: DC | PRN

## 2022-05-19 MED ORDER — FAMOTIDINE 20 MG PO TABS
ORAL_TABLET | ORAL | Status: AC
  Filled 2022-05-19: qty 1

## 2022-05-19 MED ORDER — OXYCODONE HCL 5 MG PO TABS: 5.0000 mg | ORAL_TABLET | Freq: Once | ORAL | Status: DC | PRN

## 2022-05-19 MED ORDER — DEXAMETHASONE SODIUM PHOSPHATE 10 MG/ML IJ SOLN
INTRAMUSCULAR | Status: DC | PRN
Start: 2022-05-19 — End: 2022-05-19
  Administered 2022-05-19: 10 mg via INTRAVENOUS

## 2022-05-19 MED ORDER — MIDAZOLAM HCL 2 MG/2ML IJ SOLN
INTRAMUSCULAR | Status: DC | PRN
Start: 2022-05-19 — End: 2022-05-19
  Administered 2022-05-19: 2 mg via INTRAVENOUS

## 2022-05-19 MED ORDER — FENTANYL CITRATE (PF) 100 MCG/2ML IJ SOLN: 25.0000 ug | INTRAMUSCULAR | Status: DC | PRN

## 2022-05-19 MED ORDER — SCOPOLAMINE 1 MG/3DAYS TD PT72
1.0000 | MEDICATED_PATCH | TRANSDERMAL | Status: DC
Start: 2022-05-22 — End: 2022-05-19

## 2022-05-19 MED ORDER — ONDANSETRON HCL 4 MG/2ML IJ SOLN: 4.0000 mg | Freq: Once | INTRAMUSCULAR | Status: DC | PRN

## 2022-05-19 MED ORDER — KETOROLAC TROMETHAMINE 30 MG/ML IJ SOLN
INTRAMUSCULAR | Status: DC | PRN
  Administered 2022-05-19: 30 mg via INTRAVENOUS

## 2022-05-19 MED ORDER — BUPIVACAINE HCL (PF) 0.25 % IJ SOLN
INTRAMUSCULAR | Status: AC
  Filled 2022-05-19: qty 30

## 2022-05-19 MED ORDER — ONDANSETRON HCL 4 MG/2ML IJ SOLN
INTRAMUSCULAR | Status: DC | PRN
  Administered 2022-05-19: 4 mg via INTRAVENOUS

## 2022-05-19 MED ORDER — LIDOCAINE HCL (CARDIAC) PF 100 MG/5ML IV SOSY
PREFILLED_SYRINGE | INTRAVENOUS | Status: DC | PRN
  Administered 2022-05-19: 60 mg via INTRAVENOUS

## 2022-05-19 MED ORDER — PROPOFOL 10 MG/ML IV BOLUS
INTRAVENOUS | Status: DC | PRN
  Administered 2022-05-19: 120 mg via INTRAVENOUS

## 2022-05-19 MED ORDER — ROCURONIUM BROMIDE 100 MG/10ML IV SOLN
INTRAVENOUS | Status: DC | PRN
  Administered 2022-05-19: 50 mg via INTRAVENOUS

## 2022-05-19 MED ORDER — BUPIVACAINE HCL (PF) 0.25 % IJ SOLN
INTRAMUSCULAR | Status: DC | PRN
  Administered 2022-05-19: 30 mL

## 2022-05-19 MED ORDER — ACETAMINOPHEN 10 MG/ML IV SOLN
INTRAVENOUS | Status: AC
  Filled 2022-05-19: qty 100

## 2022-05-19 MED ORDER — MIDAZOLAM HCL 2 MG/2ML IJ SOLN
INTRAMUSCULAR | Status: AC
  Filled 2022-05-19: qty 2

## 2022-05-19 MED ORDER — SODIUM CHLORIDE 0.9 % IR SOLN
Status: DC | PRN
  Administered 2022-05-19: 500 mL

## 2022-05-19 MED ORDER — FENTANYL CITRATE (PF) 100 MCG/2ML IJ SOLN
INTRAMUSCULAR | Status: DC | PRN
Start: 2022-05-19 — End: 2022-05-19
  Administered 2022-05-19: 50 ug via INTRAVENOUS

## 2022-05-19 MED ORDER — SCOPOLAMINE 1 MG/3DAYS TD PT72
MEDICATED_PATCH | TRANSDERMAL | Status: AC
  Filled 2022-05-19: qty 1

## 2022-05-19 SURGICAL SUPPLY — 47 items
ADH SKN CLS APL DERMABOND .7 (GAUZE/BANDAGES/DRESSINGS) ×1
APL PRP STRL LF DISP 70% ISPRP (MISCELLANEOUS) ×1
APL SRG 38 LTWT LNG FL B (MISCELLANEOUS)
APPLICATOR ARISTA FLEXITIP XL (MISCELLANEOUS) IMPLANT
BACTOSHIELD CHG 4% 4OZ (MISCELLANEOUS) ×1
BAG DRN RND TRDRP ANRFLXCHMBR (UROLOGICAL SUPPLIES) ×1
BAG RETRIEVAL 10 (BASKET)
BAG URINE DRAIN 2000ML AR STRL (UROLOGICAL SUPPLIES) ×1 IMPLANT
BLADE SURG SZ11 CARB STEEL (BLADE) ×2 IMPLANT
CATH FOLEY 2WAY  5CC 16FR (CATHETERS) ×2
CATH FOLEY 2WAY 5CC 16FR (CATHETERS) ×1
CATH URTH 16FR FL 2W BLN LF (CATHETERS) IMPLANT
CHLORAPREP W/TINT 26 (MISCELLANEOUS) ×2 IMPLANT
DERMABOND ADVANCED (GAUZE/BANDAGES/DRESSINGS) ×1
DERMABOND ADVANCED .7 DNX12 (GAUZE/BANDAGES/DRESSINGS) ×1 IMPLANT
DRAPE UNDER BUTTOCK W/FLU (DRAPES) ×2 IMPLANT
DRSG OPSITE POSTOP 3X4 (GAUZE/BANDAGES/DRESSINGS) ×1 IMPLANT
ELECT REM PT RETURN 9FT ADLT (ELECTROSURGICAL)
ELECTRODE REM PT RTRN 9FT ADLT (ELECTROSURGICAL) IMPLANT
GAUZE 4X4 16PLY ~~LOC~~+RFID DBL (SPONGE) IMPLANT
GLOVE BIOGEL PI IND STRL 6.5 (GLOVE) ×2 IMPLANT
GLOVE BIOGEL PI INDICATOR 6.5 (GLOVE) ×2
GLOVE SURG SS PI 6.5 STRL IVOR (GLOVE) ×4 IMPLANT
GOWN STRL REUS W/ TWL LRG LVL3 (GOWN DISPOSABLE) ×1 IMPLANT
GOWN STRL REUS W/TWL LRG LVL3 (GOWN DISPOSABLE) ×2
HEMOSTAT ARISTA ABSORB 3G PWDR (HEMOSTASIS) IMPLANT
IRRIGATION STRYKERFLOW (MISCELLANEOUS) IMPLANT
IRRIGATOR STRYKERFLOW (MISCELLANEOUS)
KIT PINK PAD W/HEAD ARE REST (MISCELLANEOUS) ×2
KIT PINK PAD W/HEAD ARM REST (MISCELLANEOUS) ×1 IMPLANT
KIT TURNOVER CYSTO (KITS) ×2 IMPLANT
MANIFOLD NEPTUNE II (INSTRUMENTS) ×4 IMPLANT
NS IRRIG 500ML POUR BTL (IV SOLUTION) ×2 IMPLANT
PACK GYN LAPAROSCOPIC (MISCELLANEOUS) ×2 IMPLANT
SCRUB CHG 4% DYNA-HEX 4OZ (MISCELLANEOUS) ×1 IMPLANT
SET TUBE SMOKE EVAC HIGH FLOW (TUBING) ×2 IMPLANT
SHEARS HARMONIC ACE PLUS 36CM (ENDOMECHANICALS) ×2 IMPLANT
SLEEVE ENDOPATH XCEL 5M (ENDOMECHANICALS) ×2 IMPLANT
SUT MNCRL AB 4-0 PS2 18 (SUTURE) ×2 IMPLANT
SUT VICRYL 0 UR6 27IN ABS (SUTURE) IMPLANT
SYS BAG RETRIEVAL 10MM (BASKET)
SYSTEM BAG RETRIEVAL 10MM (BASKET) IMPLANT
TOWEL OR 17X26 4PK STRL BLUE (TOWEL DISPOSABLE) IMPLANT
TRAY FOLEY SLVR 16FR LF STAT (SET/KITS/TRAYS/PACK) ×2 IMPLANT
TROCAR BLADELESS OPT 5 100 (ENDOMECHANICALS) ×3 IMPLANT
TROCAR XCEL BLUNT TIP 100MML (ENDOMECHANICALS) IMPLANT
WATER STERILE IRR 500ML POUR (IV SOLUTION) ×2 IMPLANT

## 2022-05-19 NOTE — Anesthesia Procedure Notes (Signed)
Procedure Name: Intubation Date/Time: 05/19/2022 10:21 AM Performed by: Debe Coder, CRNA Pre-anesthesia Checklist: Patient identified, Emergency Drugs available, Suction available and Patient being monitored Patient Re-evaluated:Patient Re-evaluated prior to induction Oxygen Delivery Method: Circle system utilized Preoxygenation: Pre-oxygenation with 100% oxygen Induction Type: IV induction Ventilation: Mask ventilation without difficulty Laryngoscope Size: McGraph and 3 Grade View: Grade I Tube type: Oral Tube size: 7.0 mm Number of attempts: 1 Airway Equipment and Method: Stylet and Oral airway Placement Confirmation: ETT inserted through vocal cords under direct vision, positive ETCO2 and breath sounds checked- equal and bilateral Secured at: 21 cm Tube secured with: Tape Dental Injury: Teeth and Oropharynx as per pre-operative assessment

## 2022-05-19 NOTE — Anesthesia Postprocedure Evaluation (Signed)
Anesthesia Post Note  Patient: Cristina [name]  Procedure(s) Performed: LAPAROSCOPIC BILATERAL SALPINGECTOMY (Bilateral: Pelvis)  Patient location during evaluation: PACU Anesthesia Type: General Level of consciousness: awake and alert, oriented and patient cooperative Pain management: pain level controlled Vital Signs Assessment: post-procedure vital signs reviewed and stable Respiratory status: spontaneous breathing, nonlabored ventilation and respiratory function stable Cardiovascular status: blood pressure returned to baseline and stable Postop Assessment: adequate PO intake Anesthetic complications: no   No notable events documented.   Last Vitals:  Vitals:   05/19/22 1145 05/19/22 1156  BP: 109/76 (!) 122/98  Pulse: 85   Resp: 18   Temp:  (!) 36.1 C  SpO2: 98%     Last Pain:  Vitals:   05/19/22 1156  TempSrc:   PainSc: 0-No pain                 Darrin Nipper

## 2022-05-19 NOTE — Transfer of Care (Signed)
Immediate Anesthesia Transfer of Care Note  Patient: Cristina [name]  Procedure(s) Performed: LAPAROSCOPIC BILATERAL SALPINGECTOMY (Bilateral: Pelvis)  Patient Location: PACU  Anesthesia Type:General  Level of Consciousness: awake and drowsy  Airway & Oxygen Therapy: Patient Spontanous Breathing and Patient connected to nasal cannula oxygen  Post-op Assessment: Report given to RN and Post -op Vital signs reviewed and stable  Post vital signs: Reviewed and stable  Last Vitals:  Vitals Value Taken Time  BP 98/63 05/19/22 1117  Temp    Pulse 89 05/19/22 1117  Resp 27 05/19/22 1117  SpO2 98 % 05/19/22 1117    Last Pain:  Vitals:   05/19/22 0858  TempSrc: Temporal  PainSc: 0-No pain         Complications: No notable events documented.

## 2022-05-19 NOTE — H&P (Signed)
Cristina [name] is an 36 y.o. female P2 here for scheduled bilateral salpingectomy. Patient reports a monthly period and is not interested in hormonal contraception. She is without complaints.   Pertinent Gynecological History: Menses: regular every month without intermenstrual spotting Contraception: rhythm method DES exposure: denies Blood transfusions: none Sexually transmitted diseases: no past history Last pap: normal Date: 03/25/22   Menstrual History: No LMP recorded.    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   Headache    Heterozygous factor V Leiden mutation (HCC) 08/12/2014   07/17/14   Intrauterine growth restriction of newborn    PONV (postoperative nausea and vomiting)    after ear surgery when she was 36 years old   Stroke [facility]) 06/2014    Past Surgical History:  Procedure Laterality Date   CESAREAN SECTION  2020   right ear surgery     TEE WITHOUT CARDIOVERSION N/A 07/18/2014   Procedure: TRANSESOPHAGEAL ECHOCARDIOGRAM (TEE);  Surgeon: Chrystie Nose, MD;  Location: [facility] ENDOSCOPY;  Service: Cardiovascular;  Laterality: N/A;   TEE WITHOUT CARDIOVERSION N/A 01/13/2015   Procedure: TRANSESOPHAGEAL ECHOCARDIOGRAM (TEE);  Surgeon: Donato Schultz, MD;  Location: [facility] ENDOSCOPY;  Service: Cardiovascular;  Laterality: N/A;    Family History  Problem Relation Age of Onset   CAD Mother    COPD Mother    Stroke Paternal Grandfather    Cancer Paternal Grandfather        lung cancer    Alcohol abuse Father    Stroke Maternal Uncle    Stroke Other        uncle 60s, grandfather 34s   Heart disease Maternal Grandfather    Heart disease Paternal Grandmother     Social History:  reports that she quit smoking about 3 years ago. Her smoking use included cigarettes. She has a 2.50 pack-year smoking  history. She has never used smokeless tobacco. She reports that she does not currently use alcohol after a past usage of about 1.0 standard drink per week. She reports that she does not use drugs.  Allergies: No Known Allergies  Medications Prior to Admission  Medication Sig Dispense Refill Last Dose   acetaminophen (TYLENOL) 500 MG tablet Take 500 mg by mouth every 6 (six) hours as needed.   05/18/2022   loratadine (CLARITIN) 10 MG tablet Take 10 mg by mouth daily.   05/18/2022   Multiple Vitamin (MULTIVITAMIN WITH MINERALS) TABS tablet Take 1 tablet by mouth daily.   05/18/2022    Review of Systems See pertinent in HPI. All other  systems reviewed and non contributory Blood pressure 105/82, pulse 78, temperature 97.6 F (36.4 C), temperature source Temporal, resp. rate 16, height 5\' 2"  (1.575 m), weight 61.2 kg, SpO2 100 %. Physical Exam GENERAL: Well-developed, well-nourished female in no acute distress.  LUNGS: Clear to auscultation bilaterally.  HEART: Regular rate and rhythm. ABDOMEN: Soft, nontender, nondistended. No organomegaly. PELVIC: Deferred to OR EXTREMITIES: No cyanosis, clubbing, or edema, 2+ distal pulses.  Results for orders placed or performed during the hospital encounter of 05/19/22 (from the past 24 hour(s))  Pregnancy, urine POC     Status: None   Collection Time: 05/19/22  8:50 AM  Result Value Ref Range   Preg Test, Ur NEGATIVE NEGATIVE    No results found.  Assessment/Plan: 36 y.o. [id number] with undesired fertility, desires permanent sterilization.  - Other reversible forms of contraception were discussed with patient; she declines all other modalities.   - Risks of procedure discussed with patient including permanence of method, risk of regret, bleeding, infection, injury to surrounding organs and need for additional procedures including laparotomy.  Failure risk less than 0.5% with increased risk of ectopic gestation if pregnancy occurs was also discussed  with patient.  Written informed consent was obtained. - Will proceed with laparoscopic bilateral salpingectomy       Zackary Mckeone 05/19/2022, 9:16 AM

## 2022-05-19 NOTE — Op Note (Signed)
Cristina [name] PROCEDURE DATE: 05/19/2022   PREOPERATIVE DIAGNOSIS:  Undesired fertility  POSTOPERATIVE DIAGNOSIS:  Undesired fertility  PROCEDURE:  Laparoscopic Bilateral Salpingectomy   SURGEON: Mora Bellman, MD  ANESTHESIA:  General endotracheal  COMPLICATIONS:  None immediate.  ESTIMATED BLOOD LOSS:  10 ml.  FLUIDS: 1000 ml LR.  URINE OUTPUT:  50 ml of clear urine.  IINDICATIONS: 36 y.o. [id number] with undesired fertility, desires permanent sterilization. Other reversible forms of contraception were discussed with patient; she declines all other modalities.  Risks of procedure discussed with patient including permanence of method, risk of regret, bleeding, infection, injury to surrounding organs and need for additional procedures including laparotomy.  Failure risk less than 0.5% with increased risk of ectopic gestation if pregnancy occurs was also discussed with patient.  Written informed consent was obtained.    FINDINGS:  Normal uterus, fallopian tubes, and ovaries.  TECHNIQUE:  The patient was taken to the operating room where general anesthesia was obtained without difficulty.  She was then placed in the dorsal lithotomy position and prepared and draped in sterile fashion.  After an adequate timeout was performed, a bivalved speculum was then placed in the patient's vagina, and the anterior lip of cervix grasped with the single-tooth tenaculum.  The uterine manipulator was then advanced into the uterus.  The speculum was removed from the vagina.  Attention was then turned to the patient's abdomen where a 5-mm skin incision was made in the umbilical fold.  The 5-mm optiview trocar, sleeve and laparoscope were then advanced without difficulty into the abdomen. Intraabdominal placement was confirmed with the laparoscope. The abdomen was then insufflated with carbon dioxide gas.  Adequate pneumoperitoneum was obtained.  A survey of the patient's pelvis and abdomen revealed the  findings above.  Two left lower quadrant  5-mm lower quadrant ports  were then placed under direct visualization.  The fallopian tubes were transected from the uterine attachments and the underlying mesosalpinx with the Harmonic scalpel allowing for bilateral salpingectomy.  The fallopian tubes were then removed from the abdomen under direct visualization.  The operative site was surveyed, and it was found to be hemostatic.   No intraoperative injury to other surrounding organs was noted.  The abdomen was desufflated and all instruments were then removed from the patient's abdomen. The fascial incision of the umbilicus was closed with a 0 Vicryl figure of eight stitch. All skin incisions were closed with Dermabond.  The uterine manipulator was removed from the vagina without complications. The patient tolerated the procedure well.  Sponge, lap, and needle counts were correct times two.  The patient was then taken to the recovery room awake, extubated and in stable condition.  The patient will be discharged to home as per PACU criteria.  Routine postoperative instructions given.

## 2022-05-19 NOTE — Anesthesia Preprocedure Evaluation (Addendum)
Anesthesia Evaluation  Patient identified by MRN, date of birth, ID band Patient awake    Reviewed: Allergy & Precautions, NPO status , Patient's Chart, lab work & pertinent test results  History of Anesthesia Complications (+) PONV and history of anesthetic complications  Airway Mallampati: II   Neck ROM: Full    Dental  (+) Upper Dentures   Pulmonary former smoker (quit 2020),    Pulmonary exam normal breath sounds clear to auscultation       Cardiovascular Exercise Tolerance: Good Normal cardiovascular exam+ Valvular Problems/Murmurs (MV endocarditis)  Rhythm:Regular Rate:Normal  ECG 05/11/22:  Normal sinus rhythm Low voltage QRS Inferior infarct , age undetermined   Neuro/Psych  Headaches, PSYCHIATRIC DISORDERS Anxiety Depression CVA (2015; residual left decreased sensation)    GI/Hepatic negative GI ROS,   Endo/Other  negative endocrine ROS  Renal/GU negative Renal ROS     Musculoskeletal   Abdominal   Peds  Hematology  (+) Blood dyscrasia (F. V Leiden mutation), anemia ,   Anesthesia Other Findings   Reproductive/Obstetrics                            Anesthesia Physical Anesthesia Plan  ASA: 3  Anesthesia Plan: General   Post-op Pain Management:    Induction: Intravenous  PONV Risk Score and Plan: 4 or greater and Ondansetron, Dexamethasone, Treatment may vary due to age or medical condition and Scopolamine patch - Pre-op  Airway Management Planned: Oral ETT  Additional Equipment:   Intra-op Plan:   Post-operative Plan: Extubation in OR  Informed Consent: I have reviewed the patients History and Physical, chart, labs and discussed the procedure including the risks, benefits and alternatives for the proposed anesthesia with the patient or authorized representative who has indicated his/her understanding and acceptance.     Dental advisory given  Plan Discussed with:  CRNA  Anesthesia Plan Comments: (Patient consented for risks of anesthesia including but not limited to:  - adverse reactions to medications - damage to eyes, teeth, lips or other oral mucosa - nerve damage due to positioning  - sore throat or hoarseness - damage to heart, brain, nerves, lungs, other parts of body or loss of life  Informed patient about role of CRNA in peri- and intra-operative care.  Patient voiced understanding.)        Anesthesia Quick Evaluation

## 2022-05-19 NOTE — Progress Notes (Signed)
Patients discharge instructions left behind. Called patients mother she stated patient has my chart and will get instructions if needed. Offered to meet her downstairs to hand her a hard copy as she just left the hospital, she declined.

## 2022-05-20 ENCOUNTER — Encounter: Payer: Self-pay | Admitting: Obstetrics and Gynecology

## 2022-05-20 LAB — SURGICAL PATHOLOGY

## 2022-06-01 ENCOUNTER — Encounter: Payer: Self-pay | Admitting: Obstetrics and Gynecology

## 2022-06-01 ENCOUNTER — Ambulatory Visit (INDEPENDENT_AMBULATORY_CARE_PROVIDER_SITE_OTHER): Payer: 59 | Admitting: Obstetrics and Gynecology

## 2022-06-01 VITALS — BP 100/70 | Ht 62.0 in | Wt 133.0 lb

## 2022-06-01 DIAGNOSIS — Z9889 Other specified postprocedural states: Secondary | ICD-10-CM

## 2022-06-01 NOTE — Progress Notes (Signed)
36 yo P2 here for post op check following a bilateral salpingectomy on 05/19/22. Patient reports doing well doing well. She denies fever or drainage from her incisions. She has returned to her daily activities and work. 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   Headache    Heterozygous factor V Leiden mutation (HCC) 08/12/2014   07/17/14   Intrauterine growth restriction of newborn    PONV (postoperative nausea and vomiting)    after ear surgery when she was 36 years old   Stroke (HCC) 06/2014   Past Surgical History:  Procedure Laterality Date   CESAREAN SECTION  2020   LAPAROSCOPIC BILATERAL SALPINGECTOMY Bilateral 05/19/2022   Procedure: LAPAROSCOPIC BILATERAL SALPINGECTOMY;  Surgeon: Catalina Antigua, MD;  Location: [facility] ORS;  Service: Gynecology;  Laterality: Bilateral;   right ear surgery     TEE WITHOUT CARDIOVERSION N/A 07/18/2014   Procedure: TRANSESOPHAGEAL ECHOCARDIOGRAM (TEE);  Surgeon: Chrystie Nose, MD;  Location: [facility] ENDOSCOPY;  Service: Cardiovascular;  Laterality: N/A;   TEE WITHOUT CARDIOVERSION N/A 01/13/2015   Procedure: TRANSESOPHAGEAL ECHOCARDIOGRAM (TEE);  Surgeon: Donato Schultz, MD;  Location: [facility] ENDOSCOPY;  Service: Cardiovascular;  Laterality: N/A;   Family History  Problem Relation Age of Onset   CAD Mother    COPD Mother    Stroke Paternal Grandfather    Cancer Paternal Grandfather        lung cancer    Alcohol abuse Father    Stroke Maternal Uncle    Stroke Other        uncle 18s, grandfather 27s   Heart disease Maternal Grandfather    Heart disease Paternal Grandmother    Social History   Tobacco Use   Smoking status: Former    Packs/day: 0.25    Years: 10.00    Pack years: 2.50    Types: Cigarettes    Quit date: 01/07/2019    Years since quitting:  3.4   Smokeless tobacco: Never   Tobacco comments:    Quit once found out she was pregnant  Vaping Use   Vaping Use: Never used  Substance Use Topics   Alcohol use: Not Currently    Alcohol/week: 1.0 standard drink    Types: 1 Shots of liquor per week    Comment: Occasionally.   Drug use: No   ROS See pertinent in HPI. All other systems reviewed and non contributory Blood pressure 100/70, height 5\' 2"  (1.575 m), weight 133 lb (60.3 kg), last menstrual period 05/13/2022.  GENERAL: Well-developed, well-nourished female in no acute distress.  ABDOMEN: Soft, nontender, nondistended. No organomegaly. Incisions: healed x 3 without erythema, induration or drainage PELVIC: Not indicated EXTREMITIES: No cyanosis, clubbing, or edema, 2+ distal pulses.  A/P 36 yo here for post op check s/p LSC bilateral salpingectomy - Reviewed pathology results with the patient - Patient is medically cleared to resume all activities of daily living - Patient had a normal pap smear 02/2022

## 2023-06-22 ENCOUNTER — Emergency Department
Admission: EM | Admit: 2023-06-22 | Discharge: 2023-06-22 | Disposition: A | Discharge status: Home / Self Care | Payer: BC Managed Care – PPO | Attending: Emergency Medicine | Admitting: Emergency Medicine

## 2023-06-22 ENCOUNTER — Other Ambulatory Visit: Payer: Self-pay

## 2023-06-22 ENCOUNTER — Emergency Department: Payer: BC Managed Care – PPO

## 2023-06-22 DIAGNOSIS — R202 Paresthesia of skin: Secondary | ICD-10-CM | POA: Insufficient documentation

## 2023-06-22 DIAGNOSIS — R200 Anesthesia of skin: Secondary | ICD-10-CM | POA: Diagnosis present

## 2023-06-22 DIAGNOSIS — R531 Weakness: Secondary | ICD-10-CM | POA: Insufficient documentation

## 2023-06-22 DIAGNOSIS — R2990 Unspecified symptoms and signs involving the nervous system: Secondary | ICD-10-CM

## 2023-06-22 LAB — DIFFERENTIAL
Abs Immature Granulocytes: 0.02 10*3/uL (ref 0.00–0.07)
Basophils Absolute: 0 10*3/uL (ref 0.0–0.1)
Basophils Relative: 1 %
Eosinophils Absolute: 0.1 10*3/uL (ref 0.0–0.5)
Eosinophils Relative: 2 %
Immature Granulocytes: 0 %
Lymphocytes Relative: 31 %
Lymphs Abs: 1.6 10*3/uL (ref 0.7–4.0)
Monocytes Absolute: 0.6 10*3/uL (ref 0.1–1.0)
Monocytes Relative: 11 %
Neutro Abs: 2.9 10*3/uL (ref 1.7–7.7)
Neutrophils Relative %: 55 %

## 2023-06-22 LAB — APTT: aPTT: 27 seconds (ref 24–36)

## 2023-06-22 LAB — COMPREHENSIVE METABOLIC PANEL
ALT: 13 U/L (ref 0–44)
AST: 20 U/L (ref 15–41)
Albumin: 4.2 g/dL (ref 3.5–5.0)
Alkaline Phosphatase: 57 U/L (ref 38–126)
Anion gap: 6 (ref 5–15)
BUN: 12 mg/dL (ref 6–20)
CO2: 24 mmol/L (ref 22–32)
Calcium: 9.4 mg/dL (ref 8.9–10.3)
Chloride: 106 mmol/L (ref 98–111)
Creatinine, Ser: 0.85 mg/dL (ref 0.44–1.00)
GFR, Estimated: >60 mL/min (ref >60)
Glucose, Bld: 95 mg/dL (ref 70–99)
Potassium: 3.8 mmol/L (ref 3.5–5.1)
Sodium: 136 mmol/L (ref 135–145)
Total Bilirubin: 0.8 mg/dL (ref 0.3–1.2)
Total Protein: 7.9 g/dL (ref 6.5–8.1)

## 2023-06-22 LAB — ETHANOL: Alcohol, Ethyl (B): <10 mg/dL (ref <10)

## 2023-06-22 LAB — PROTIME-INR
INR: 1 (ref 0.8–1.2)
Prothrombin Time: 12.8 seconds (ref 11.4–15.2)

## 2023-06-22 LAB — CBC
HCT: 39.9 % (ref 36.0–46.0)
Hemoglobin: 13.3 g/dL (ref 12.0–15.0)
MCH: 30.3 pg (ref 26.0–34.0)
MCHC: 33.3 g/dL (ref 30.0–36.0)
MCV: 90.9 fL (ref 80.0–100.0)
Platelets: 237 10*3/uL (ref 150–400)
RBC: 4.39 MIL/uL (ref 3.87–5.11)
RDW: 13.5 % (ref 11.5–15.5)
WBC: 5.3 10*3/uL (ref 4.0–10.5)
nRBC: 0 % (ref 0.0–0.2)

## 2023-06-22 LAB — CBG MONITORING, ED: Glucose-Capillary: 91 mg/dL (ref 70–99)

## 2023-06-22 MED ORDER — SODIUM CHLORIDE 0.9% FLUSH
3.0000 mL | Freq: Once | INTRAVENOUS | Status: AC
  Administered 2023-06-22: 3 mL via INTRAVENOUS

## 2023-06-22 NOTE — Progress Notes (Signed)
[facility] 1218 - Dr. Wilford Corner already at bedside at this time  Per Dr. Wilford Corner at 1220, telestroke no longer needed.  mRS 0 [id number] 0820 no TNK

## 2023-06-22 NOTE — ED Notes (Signed)
MRI taking pt at this time.

## 2023-06-22 NOTE — ED Notes (Signed)
Pt back from MRI 

## 2023-06-22 NOTE — Consult Note (Signed)
Neurology Consultation  Reason for Consult: Code stroke-right arm numbness tingling weakness Referring Physician: Dr. Rosalia Hammers  CC: Code stroke-right arm numbness tingling weakness History is obtained from: Patient, chart  HPI: Cristina [name] is a 37 y.o. 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  She reports the last known well/symptom onset time at around 8:20 AM when she was dropping her kids to daycare.  She felt that her arm had become a little tingly and numb and the weakness went up to her elbow.  Having had a prior stroke, she was concerned about the symptoms and came in for further evaluation to the emergency room. She was within the window but symptoms are too mild to treat hence she did not receive IV TNKase. Denies any headaches.  Denies visual changes.  Denies chest pain. Currently only on baby aspirin.  At 1 point was on anticoagulation-in 2016 due to a mitral valve mobile vegetation and suspected lupus because of positive ANA and risk of recurrent embolic strokes-newly on warfarin but never therapeutic, changed to Xarelto and then finally discontinued Xarelto. Seen rheumatology at [facility] who do not think she has lupus. The mobile vegetation for which anticoagulation was started had resolved on repeat TEE. She was diagnosed with possible Raynaud's and questionable lupus and was on Plaquenil.  She has not followed with neurology since 2017.   Of note she also has mildly low protein S on initial hypercoagulable workup and heterozygous factor V mutation.  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  LKW: 8:20 AM IV thrombolysis given?: no, too mild to treat-NIH at best 1 EVT: Symptoms not consistent with LVO Premorbid modified Rankin scale (mRS):1-due to minimal residual sensory deficits on the left side since the prior stroke.   ROS: Full ROS was performed and is negative except as noted in the HPI.   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   Headache    Heterozygous factor V Leiden mutation (HCC) 08/12/2014   07/17/14   Intrauterine growth restriction of newborn    PONV (postoperative nausea and vomiting)    after ear surgery when she was 37 years old   Stroke ([facility]) 06/2014   Family History  Problem Relation Age of Onset   CAD Mother    COPD Mother    Stroke Paternal Grandfather    Cancer Paternal Grandfather        lung cancer    Alcohol abuse Father    Stroke Maternal Uncle    Stroke Other        uncle 18s, grandfather 52s   Heart disease Maternal Grandfather    Heart disease Paternal Grandmother    Social History:   reports that she  quit smoking about 4 years ago. Her smoking use included cigarettes. She has a 2.50 pack-year smoking history. She has never used smokeless tobacco. She reports that she does not currently use alcohol after a past usage of about 1.0 standard drink of alcohol per week. She reports that she does not use drugs.  Medications No current facility-administered medications for this encounter.  Current Outpatient Medications:    ibuprofen (ADVIL) 600 MG tablet, Take 1 tablet (600 mg total) by mouth every 6 (six) hours as  needed., Disp: 60 tablet, Rfl: 3   loratadine (CLARITIN) 10 MG tablet, Take 10 mg by mouth daily., Disp: , Rfl:    Multiple Vitamin (MULTIVITAMIN WITH MINERALS) TABS tablet, Take 1 tablet by mouth daily., Disp: , Rfl:    oxyCODONE-acetaminophen (PERCOCET/ROXICET) 5-325 MG tablet, Take 1 tablet by mouth every 6 (six) hours as needed. (Patient not taking: Reported on 06/01/2022), Disp: 20 tablet, Rfl: 0  Exam: Current vital signs: BP (!) 134/93   Pulse 95   Temp 98.5 F (36.9 C) (Oral)   Resp 18   Ht 5\' 2"  (1.575 m)   Wt 60.3 kg   LMP 05/13/2022   SpO2 100%   BMI 24.31 kg/m  Vital signs in last 24 hours: Temp:  [98.5 F (36.9 C)] 98.5 F (36.9 C) (06/27 1200) Pulse Rate:  [95] 95 (06/27 1200) Resp:  [18] 18 (06/27 1200) BP: (134)/(93) 134/93 (06/27 1200) SpO2:  [100 %] 100 % (06/27 1200) Weight:  [60.3 kg] 60.3 kg (06/27 1200)  GENERAL: Awake, alert in NAD HEENT: - Normocephalic and atraumatic, dry mm, no LN++, no Thyromegally LUNGS - Clear to auscultation bilaterally with no wheezes CV - S1S2 RRR, no m/r/g, equal pulses bilaterally. ABDOMEN - Soft, nontender, nondistended with normoactive BS Ext: warm, well perfused, intact peripheral pulses, no edema  NEURO:  Mental Status: AA&Ox3  Language: speech is nondysarthric.  Naming, repetition, fluency, and comprehension intact. Cranial Nerves: PERRL EOMI, visual fields full, no facial asymmetry, facial sensation intact, hearing intact, tongue/uvula/soft palate midline, normal sternocleidomastoid and trapezius muscle strength. No evidence of tongue atrophy or fibrillations Motor: 5/5 with no drift in any of the 4 extremities Tone: is normal and bulk is normal Sensation-intact to touch all over.  Subjectively feels little tingling in the right fingertips which is also resolving. Coordination: FTN intact bilaterally, no ataxia in BLE. Gait- deferred  NIHSS--1 (at best)  Labs I have reviewed labs in epic and the results pertinent  to this consultation are: CBC    Component Value Date/Time   WBC 5.3 06/22/2023 1202   RBC 4.39 06/22/2023 1202   HGB 13.3 06/22/2023 1202   HGB 11.3 05/30/2019 1550   HCT 39.9 06/22/2023 1202   HCT 32.4 (L) 05/30/2019 1550   PLT 237 06/22/2023 1202   PLT 199 05/30/2019 1550   MCV 90.9 06/22/2023 1202   MCV 96 05/30/2019 1550   MCV 98 08/01/2012 0855   MCH 30.3 06/22/2023 1202   MCHC 33.3 06/22/2023 1202   RDW 13.5 06/22/2023 1202   RDW 12.5 05/30/2019 1550   RDW 12.6 08/01/2012 0855   LYMPHSABS 1.6 06/22/2023 1202   LYMPHSABS 1.7 05/30/2019 1550   LYMPHSABS 2.5 08/01/2012 0855   MONOABS 0.6 06/22/2023 1202   MONOABS 0.5 08/01/2012 0855   EOSABS 0.1 06/22/2023 1202   EOSABS 0.0 05/30/2019 1550   EOSABS 0.1 08/01/2012 0855   BASOSABS 0.0 06/22/2023 1202   BASOSABS 0.0 05/30/2019 1550   BASOSABS 0.1 08/01/2012 0855  CMP     Component Value Date/Time   NA 136 06/22/2023 1202   NA 135 (L) 08/01/2012 0855   K 3.8 06/22/2023 1202   K 5.1 08/01/2012 0855   CL 106 06/22/2023 1202   CL 104 08/01/2012 0855   CO2 24 06/22/2023 1202   CO2 22 08/01/2012 0855   GLUCOSE 95 06/22/2023 1202   GLUCOSE 79 08/01/2012 0855   BUN 12 06/22/2023 1202   BUN 7 08/01/2012 0855   CREATININE 0.85 06/22/2023 1202   CREATININE 1.05 07/29/2014 1303   CALCIUM 9.4 06/22/2023 1202   CALCIUM 9.0 08/01/2012 0855   PROT 7.9 06/22/2023 1202   ALBUMIN 4.2 06/22/2023 1202   AST 20 06/22/2023 1202   AST 49 (H) 08/01/2012 0855   ALT 13 06/22/2023 1202   ALKPHOS 57 06/22/2023 1202   BILITOT 0.8 06/22/2023 1202   GFRNONAA >60 06/22/2023 1202   GFRNONAA 73 07/29/2014 1303   GFRAA >60 06/13/2019 0811   GFRAA 84 07/29/2014 1303    Imaging I have reviewed the images obtained:  CT-head code stroke: No acute abnormality.  Chronic left ACA and right MCA infarcts.  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  Still has some residual tingling in the right fingertips but most of the other symptoms have since resolved. Given her prior history of stroke, stroke remains top of the differential.  Other differentials include a TIA versus possible electrographic disturbance akin to seizures (emanating from the prior areas of stroke) leading to the symptoms.  Recommendations: Stat MRI brain-if positive for stroke, will need admission for full workup If negative, can follow outpatient with neurology and rheumatology.  Will need outpatient EEG as well. Within window for IV thrombolysis but thrombolytics not given due to extremely mild symptoms-this was discussed in detail with the patient and she agreed. Plan was discussed with the patient and Dr. Rosalia Hammers in the ER. I will follow-up with you after the MRI is completed.  ADDENDUM MR brain completed and reviewed. Shows chronic infarcts and no acute findings. Outpatient follow up with neurology and rheumatology. Continue ASA  -- Milon Dikes, MD Neurologist [facility] Pager: [phone]

## 2023-06-22 NOTE — ED Triage Notes (Signed)
Pt here with right arm numbness and aphasia. Pt states her speech has resolved but she is still having tingling in that right arm. Pt states she has had a stroke in the past. Pt is not on blood thinners. LKW was 0820.

## 2023-06-22 NOTE — ED Notes (Signed)
Neurologist to CT at this time.

## 2023-06-22 NOTE — ED Notes (Signed)
Code  stroke  called  to  [facility] 

## 2023-06-22 NOTE — Discharge Instructions (Addendum)
You were seen in the emergency department today for evaluation of numbness and tingling in your arm as well as weakness.  Fortunately your lab work and imaging was overall reassuring.  Specifically your MRI did not show signs of a new stroke.  We reviewed the case with our neurologist.  I am glad you are feeling better.  Please follow-up with neurology as well as rheumatology for further evaluation of your symptoms.  I have included contact information in your paperwork.  Return to the ER for any new or worsening symptoms.

## 2023-06-22 NOTE — ED Notes (Addendum)
Pt in room at this time. Neurologist at bedside. LKW at 0820. Some aphagia that resolved. Some tingling in the right arm.

## 2023-06-22 NOTE — ED Provider Notes (Signed)
[facility] Provider Note    Event Date/Time   First MD Initiated Contact with Patient 06/22/23 1206     (approximate)   History   Numbness   HPI  Cristina [name] is a 37 y.o. female with prior CVA with residual left-sided deficit who presented as a code stroke for sudden onset right arm tingling and numbness with weakness.  Last known well 820am.  Patient noticed a tingly feeling in her right arm.  Her arm did feel weak and she dropped a piece of toast that she was holding.  She also reports a brief episode where her speech sounded garbled.  At presentation to the ER, speech had improved, but had ongoing paresthesias and weakness in her right arm.  Was activated as code stroke on presentation.  On my evaluation after completion of her imaging, patient reports her symptoms have completely resolved.     Physical Exam   Triage Vital Signs: ED Triage Vitals [06/22/23 1200]  Enc Vitals Group     BP (!) 134/93     Pulse Rate 95     Resp 18     Temp 98.5 F (36.9 C)     Temp Source Oral     SpO2 100 %     Weight 132 lb 15 oz (60.3 kg)     Height 5\' 2"  (1.575 m)     Head Circumference      Peak Flow      Pain Score 0     Pain Loc      Pain Edu?      Excl. in GC?     Most recent vital signs: Vitals:   06/22/23 1500 06/22/23 1530  BP: (!) 160/79 (!) 145/75  Pulse: 69 61  Resp: 18 17  Temp:    SpO2: 100% 100%     General: Awake, interactive  CV:  Regular rate, good peripheral perfusion.  Resp:  Lungs clear, unlabored respirations.  Abd:  Soft, nondistended.  Neuro:  Keenly aware, correctly answers month and age, able to blink eyes and squeeze hands, normal horizontal extraocular movements, no visual field loss, normal facial symmetry, no arm or leg motor drift, no limb ataxia, normal sensation, no aphasia, no dysarthria, no inattention    ED Results / Procedures / Treatments   Labs (all labs ordered are listed, but only abnormal  results are displayed) Labs Reviewed  PROTIME-INR  APTT  CBC  DIFFERENTIAL  COMPREHENSIVE METABOLIC PANEL  ETHANOL  CBG MONITORING, ED     EKG EKG independently reviewed interpreted by myself (ER attending) demonstrates:  EKG demonstrates sinus rhythm at a rate of 82, PR 130, QRS 94, QTc 427, no acute ST changes  RADIOLOGY Imaging independently reviewed and interpreted by myself demonstrates:  CT code stroke without acute bleed MRI brain without acute stroke  PROCEDURES:  Critical Care performed: Yes, see critical care procedure note(s)  Procedures   MEDICATIONS ORDERED IN ED: Medications  sodium chloride flush (NS) 0.9 % injection 3 mL (3 mLs Intravenous Given 06/22/23 1232)     IMPRESSION / MDM / ASSESSMENT AND PLAN / ED COURSE  I reviewed the triage vital signs and the nursing notes.  Differential diagnosis includes, but is not limited to, acute CVA, TIA, anemia, electrolyte abnormality  Patient's presentation is most consistent with acute presentation with potential threat to life or bodily function.  37 year old female presenting with acute right-sided paresthesias.  Activated as a code stroke on presentation.  Case reviewed with Dr. Wilford Corner.  Patient with extensive prior outpatient workup.  He did recommend obtaining an MRI in the ER.  If this was reassuring, did feel that patient to be discharged with outpatient neurology and rheumatology follow-up.  MRI did return without acute infarct.  Patient comfortable with discharge home.  Patient discharged with plans for outpatient follow-up.      FINAL CLINICAL IMPRESSION(S) / ED DIAGNOSES   Final diagnoses:  Right sided weakness  Arm paresthesia, right     Rx / DC Orders   ED Discharge Orders     None        Note:  This document was prepared using Dragon voice recognition software and may include unintentional dictation errors.   Trinna Post, MD 06/22/23 [phone]

## 2023-06-22 NOTE — Progress Notes (Signed)
   06/22/23 1200  Spiritual Encounters  Type of Visit Initial  Care provided to: Patient  Referral source Code page  Reason for visit Code (Stroke)  OnCall Visit Yes  Interventions  Spiritual Care Interventions Made Encouragement;Established relationship of care and support  Intervention Outcomes  Outcomes Awareness of support  Spiritual Care Plan  Spiritual Care Issues Still Outstanding No further spiritual care needs at this time (see row info)   Chaplain provided spiritual support with compassionate presence and empathetic listening. Chaplain spiritual support services remain available as the need arises.
